# Patient Record
Sex: Male | Born: 1950
Health system: Southern US, Community
[De-identification: ages and names within clinical notes are randomized; demographics above are authoritative.]

## PROBLEM LIST (undated history)

## (undated) DIAGNOSIS — E7800 Pure hypercholesterolemia, unspecified: Secondary | ICD-10-CM

## (undated) DIAGNOSIS — I2510 Atherosclerotic heart disease of native coronary artery without angina pectoris: Secondary | ICD-10-CM

## (undated) DIAGNOSIS — C801 Malignant (primary) neoplasm, unspecified: Secondary | ICD-10-CM

## (undated) DIAGNOSIS — J432 Centrilobular emphysema: Secondary | ICD-10-CM

## (undated) DIAGNOSIS — C4490 Unspecified malignant neoplasm of skin, unspecified: Secondary | ICD-10-CM

## (undated) DIAGNOSIS — J449 Chronic obstructive pulmonary disease, unspecified: Secondary | ICD-10-CM

## (undated) DIAGNOSIS — D126 Benign neoplasm of colon, unspecified: Secondary | ICD-10-CM

## (undated) DIAGNOSIS — I4891 Unspecified atrial fibrillation: Secondary | ICD-10-CM

## (undated) DIAGNOSIS — F17200 Nicotine dependence, unspecified, uncomplicated: Secondary | ICD-10-CM

## (undated) DIAGNOSIS — Z86718 Personal history of other venous thrombosis and embolism: Secondary | ICD-10-CM

## (undated) HISTORY — DX: Nicotine dependence, unspecified, uncomplicated: F17.200

## (undated) HISTORY — DX: Benign neoplasm of colon, unspecified: D12.6

## (undated) HISTORY — DX: Personal history of other venous thrombosis and embolism: Z86.718

## (undated) HISTORY — PX: APPENDECTOMY: SHX54

## (undated) HISTORY — PX: TONSILLECTOMY: SUR1361

## (undated) HISTORY — DX: Centrilobular emphysema: J43.2

---

## 2005-08-07 ENCOUNTER — Emergency Department (HOSPITAL_COMMUNITY): Admission: EM | Admit: 2005-08-07 | Discharge: 2005-08-08 | Payer: Self-pay | Admitting: *Deleted

## 2005-08-22 ENCOUNTER — Encounter: Admission: RE | Admit: 2005-08-22 | Discharge: 2005-08-22 | Payer: Self-pay | Admitting: Internal Medicine

## 2006-08-26 ENCOUNTER — Encounter: Admission: RE | Admit: 2006-08-26 | Discharge: 2006-08-26 | Payer: Self-pay | Admitting: Internal Medicine

## 2007-05-03 IMAGING — CR DG CERVICAL SPINE COMPLETE 4+V
7 series · 7 of 7 positions shown · non-contrast
Comparison: None.

CLINICAL DATA: Fall from tractor.  Back pain.

[w c-spine lat]
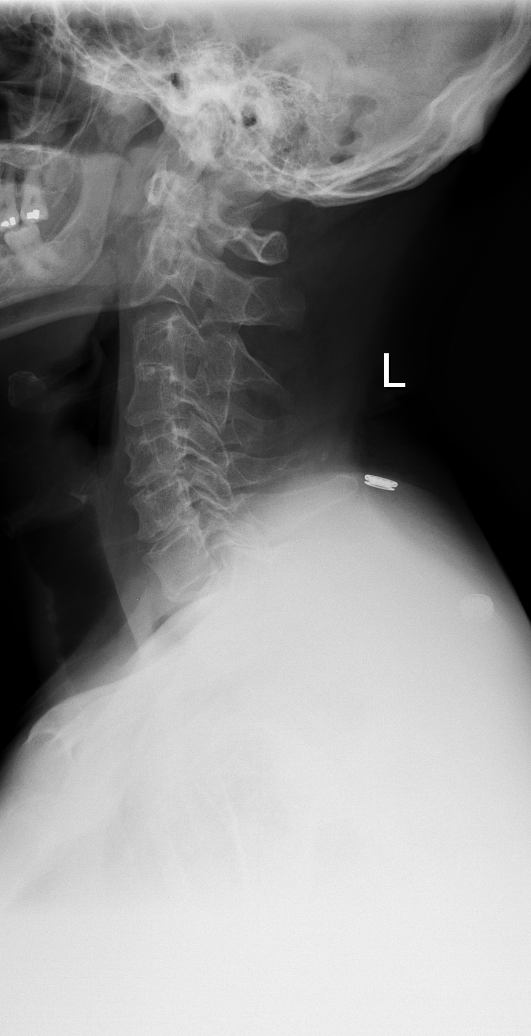

[w swimmers view]
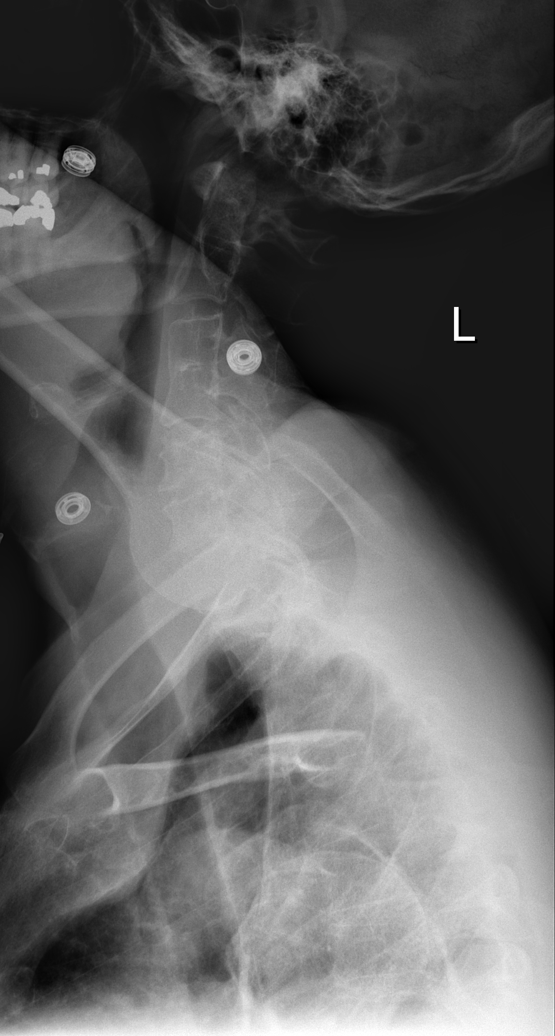

[w c-spine oblique (1 of 2)]
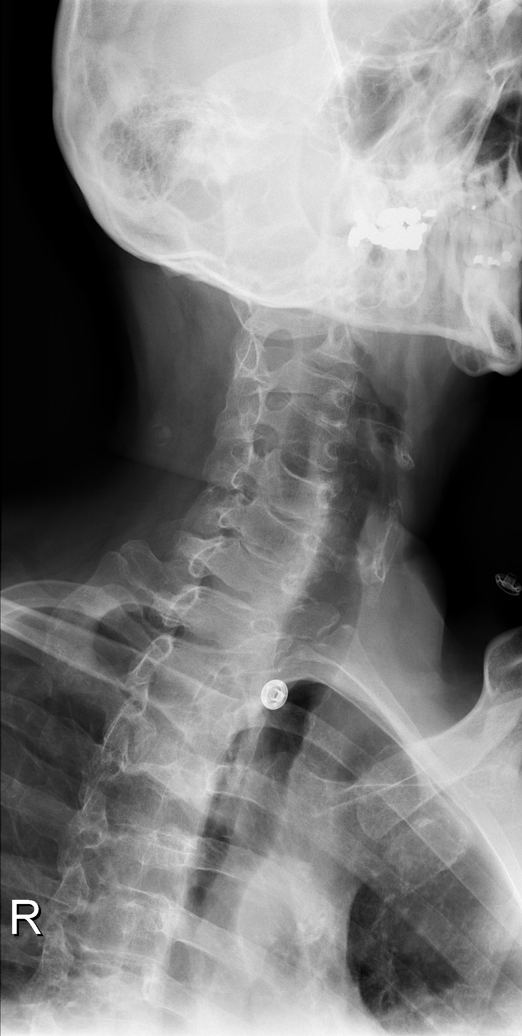

[w c-spine oblique (2 of 2)]
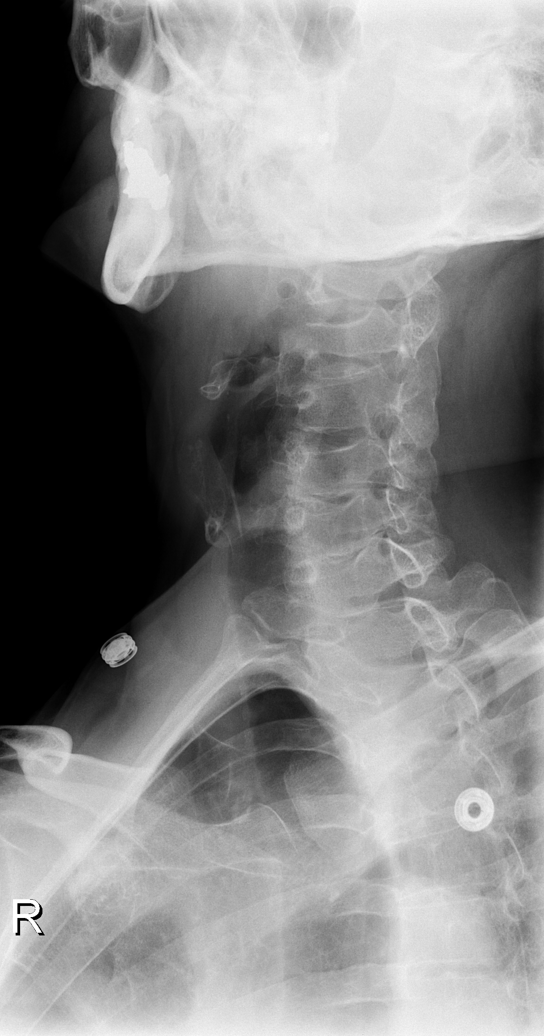

[w c-spine a.p.]
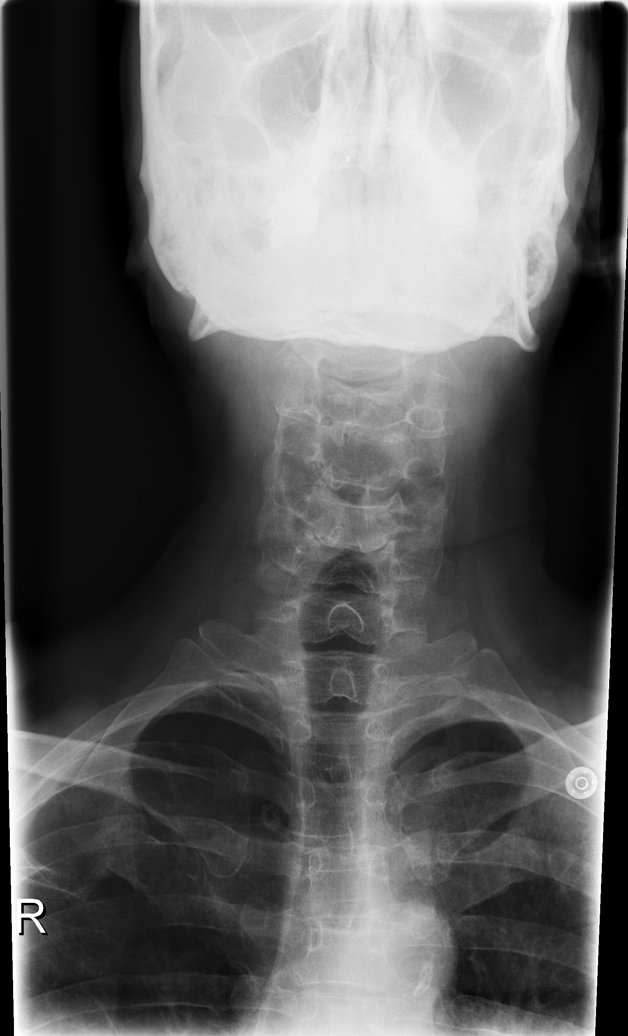

[w c-spine odontoid *]
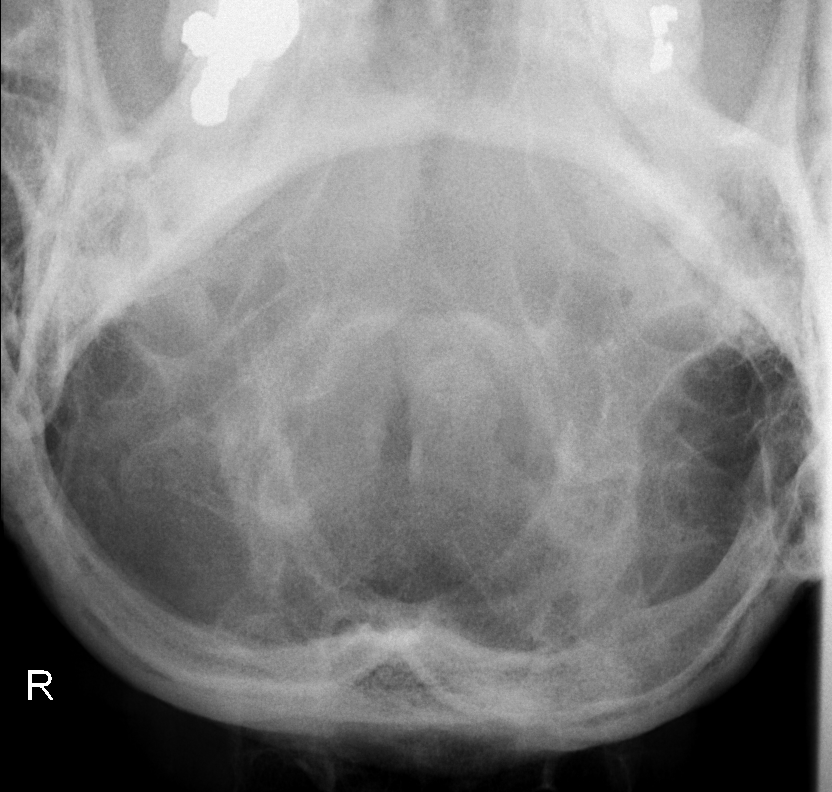

[w c-spine odontoid]
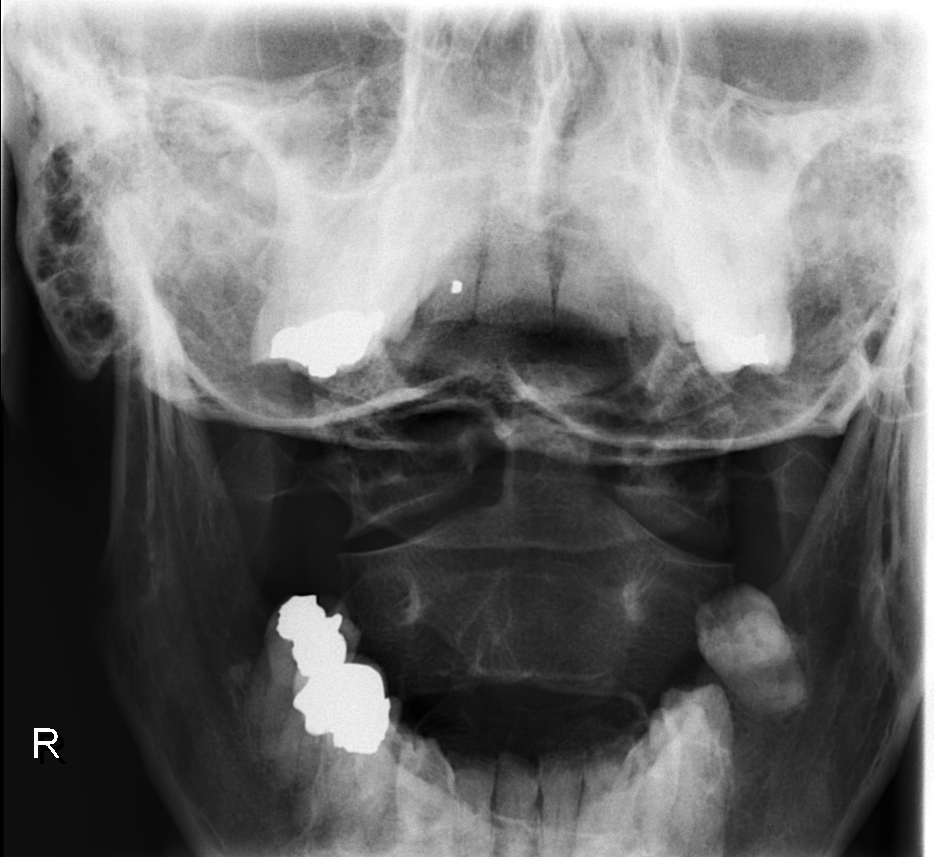

[7 of 7 positions shown; findings below may reference images not displayed]

CERVICAL SPINE - 5 VIEW:
 There is no evidence for acute fracture or subluxation from the skull base through the C7-T1 interspace.  Patient is congenitally fused at C3-4.  There is diffuse degenerative disk disease with end-plate spurring in the lower cervical spine.  Disk height at C2-3 is preserved.  Spinal laminar line is intact.  Oblique views demonstrate bony foraminal encroachment on the right at C5-6 and C6-7 and on the left at C6-7 secondary mainly to uncinate spurring.  Facets are diffusely degenerated.  There is no prevertebral soft tissue swelling.
IMPRESSION: 1.  No evidence for acute fracture or subluxation from the skull base to the C7-T1 interspace.  
 2.  Congenital fusion at C3-4.  
 3.  Diffuse degenerative disk disease and degenerative facet disease.  If there is a high clinical index of suspicion for cervical spine fracture, CT would be a more sensitive means to investigate.

## 2012-03-18 HISTORY — PX: COLONOSCOPY: SHX174

## 2012-05-28 ENCOUNTER — Other Ambulatory Visit: Payer: Self-pay | Admitting: Urology

## 2012-06-19 ENCOUNTER — Other Ambulatory Visit: Payer: Self-pay | Admitting: Urology

## 2012-06-23 ENCOUNTER — Encounter (HOSPITAL_COMMUNITY): Payer: Self-pay | Admitting: Pharmacy Technician

## 2012-06-30 ENCOUNTER — Encounter (HOSPITAL_COMMUNITY): Payer: Self-pay

## 2012-06-30 ENCOUNTER — Ambulatory Visit (HOSPITAL_COMMUNITY)
Admission: RE | Admit: 2012-06-30 | Discharge: 2012-06-30 | Disposition: A | Discharge status: Home / Self Care | Payer: BC Managed Care – PPO | Source: Ambulatory Visit | Attending: Urology | Admitting: Urology

## 2012-06-30 ENCOUNTER — Encounter (HOSPITAL_COMMUNITY)
Admission: RE | Admit: 2012-06-30 | Discharge: 2012-06-30 | Disposition: A | Discharge status: Home / Self Care | Payer: BC Managed Care – PPO | Source: Ambulatory Visit | Attending: Urology | Admitting: Urology

## 2012-06-30 ENCOUNTER — Other Ambulatory Visit: Payer: Self-pay | Admitting: Urology

## 2012-06-30 DIAGNOSIS — R091 Pleurisy: Secondary | ICD-10-CM | POA: Insufficient documentation

## 2012-06-30 DIAGNOSIS — Z01810 Encounter for preprocedural cardiovascular examination: Secondary | ICD-10-CM | POA: Insufficient documentation

## 2012-06-30 DIAGNOSIS — C61 Malignant neoplasm of prostate: Secondary | ICD-10-CM | POA: Insufficient documentation

## 2012-06-30 DIAGNOSIS — Z01812 Encounter for preprocedural laboratory examination: Secondary | ICD-10-CM | POA: Insufficient documentation

## 2012-06-30 DIAGNOSIS — J439 Emphysema, unspecified: Secondary | ICD-10-CM | POA: Insufficient documentation

## 2012-06-30 HISTORY — DX: Pure hypercholesterolemia, unspecified: E78.00

## 2012-06-30 HISTORY — DX: Malignant (primary) neoplasm, unspecified: C80.1

## 2012-06-30 LAB — CBC
MCH: 32.8 pg (ref 26.0–34.0)
MCHC: 35.6 g/dL (ref 30.0–36.0)
RBC: 4.72 MIL/uL (ref 4.22–5.81)
WBC: 6.6 10*3/uL (ref 4.0–10.5)

## 2012-06-30 LAB — BASIC METABOLIC PANEL
BUN: 11 mg/dL (ref 6–23)
CO2: 32 mEq/L (ref 19–32)
Creatinine, Ser: 0.88 mg/dL (ref 0.50–1.35)
GFR calc Af Amer: >90 mL/min (ref >90)
Potassium: 3.9 mEq/L (ref 3.5–5.1)
Sodium: 141 mEq/L (ref 135–145)

## 2012-06-30 LAB — ABO/RH: ABO/RH(D): AB POS

## 2012-06-30 LAB — SURGICAL PCR SCREEN: Staphylococcus aureus: NEGATIVE

## 2012-06-30 NOTE — Progress Notes (Signed)
Left voice mail with Monica Martinez, Dr Peggyann Shoals schedular requesting order for consent to be placed in EPIC before patients PST appt today

## 2012-06-30 NOTE — Patient Instructions (Addendum)
20 Dante Roudebush  06/30/2012   Your procedure is scheduled on:  07/09/12 THURSDAY  Report to [facility] at    0515   AM.  Call this number if you have problems the morning of surgery: [phone]       Remember:   Do not eat food After Midnight.TUESDAY NIGHT.  START DRINKING CLEAR  LIQUIDS Wednesday MORNING AS DIRECTED BY OFFICE FOR BOWEL PREP.  INCREASE AMOUNT FLUIDS TAKEN IN ON Wednesday.  DRINK UNTIL MIDNIGHT OR BEDTIME Wednesday NIGHT,  THEN NOTHING BY MOUTH AFTER MIDNIGHT Wednesday NIGHT   Take these medicines the morning of surgery with A SIP OF WATER:  NONE   .  Contacts, dentures or partial plates can not be worn to surgery  Leave suitcase in the car. After surgery it may be brought to your room.  For patients admitted to the hospital, checkout time is 11:00 AM day of  discharge.             SPECIAL INSTRUCTIONS- SEE  PREPARING FOR SURGERY INSTRUCTION SHEET-     DO NOT WEAR JEWELRY, LOTIONS, POWDERS, OR PERFUMES.  WOMEN-- DO NOT SHAVE LEGS OR UNDERARMS FOR 12 HOURS BEFORE SHOWERS. MEN MAY SHAVE FACE.  Patients discharged the day of surgery will not be allowed to drive home. IF going home the day of surgery, you must have a driver and someone to stay with you for the first 24 hours  Name and phone number of your driver:  admission                                                                      Please read over the following fact sheets that you were given: MRSA Information, Incentive Spirometry Sheet, Blood Transfusion Sheet  Information                                                                                   Sabriyah Wilcher  PST [phone]  [phone]                 FAILURE TO FOLLOW THESE INSTRUCTIONS MAY RESULT IN  CANCELLATION   OF YOUR SURGERY                                                  Patient Signature _____________________________

## 2012-07-01 NOTE — Progress Notes (Signed)
Faxed chest x ray report to Dr Laverle Patter with confirmation. Also requested for order for consent to be placed in EPIC to be signed day of surgery

## 2012-07-01 NOTE — Progress Notes (Signed)
Left message with Selita at [facility] of abnormal chest x ray and that I had faxed over report to Dr Laverle Patter this AM. Also, reminded her there isnt a order for surgical consent to be signed

## 2012-07-08 NOTE — H&P (Addendum)
  Chief Complaint  Prostate Cancer     History of Present Illness  [name] is a 62 year old who was noted to have an elevated PSA of 11.5 and a left sided prostate nodule prompting a prostate biopsy by Dr. Retta Diones on 05/06/12.  This demonstrated Gleason 3+4=7 adenocarcinoma with 4 out of 12 biopsy cores positive for malignancy. On TRUS, he did have left sided calcification which may explain his DRE findings. His father and brother both have a history of prostate cancer. He is excellent overall health with no major medical comorbidities aside from hypertension.  He is very well informed about his treatment options through his prior discussions with Dr. Retta Diones and is interested in proceeding with surgical therapy.  TNM stage: cT2a Nx Mx PSA: 11.5 Gleason score: 3+4=7 Biopsy (05/06/12): 4/12 cores positive    Left: L lateral mid (10%, 3+3=6)    Right: R apex (40%, 3+3=6), R lateral apex (40%, 3+4=7, PNI), R lateral mid (20%, 3+3=6) Prostate volume:37.2 cc  Nomogram OC disease: 55% EPE: 43% SVI: 10% LNI: 3.6% PFS (surgery) 90%, 85%   Past Medical History  None   Surgical History Problems  1. History of  Tonsillectomy  Current Meds 1. Aspirin 81 MG Oral Tablet; Therapy: (Recorded:06Jan2014) to 2. Multi-Vitamin Oral Tablet; Therapy: (Recorded:06Jan2014) to  Allergies Medication  1. No Known Drug Allergies  Family History Problems  1. Family history of  Death In The Family Father Deceased at age 3; cancer 2. Family history of  Death In The Family Mother Deceased at age 5; stroke 3. Family history of  Heart Disease V17.49 4. Paternal history of  Prostate Cancer V16.42 5. Fraternal history of  Prostate Cancer V16.42  Social History Problems    Alcohol Use occ/social   Marital History - Currently Married   Occupation: Retired   Tobacco Use 305.1 Has smoked 1 ppd for 45 yrs  Review of Systems Constitutional, skin, eye, otolaryngeal, hematologic/lymphatic,  cardiovascular, pulmonary, endocrine, musculoskeletal, gastrointestinal, neurological and psychiatric system(s) were reviewed and pertinent findings if present are noted.    Vitals  BMI Calculated: 26.41 BSA Calculated: 2.11 Height: 6 ft  Weight: 195 lb    Physical Exam Constitutional: Well nourished and well developed . No acute distress.  ENT:. The ears and nose are normal in appearance.  Neck: The appearance of the neck is normal and no neck mass is present.  Pulmonary: No respiratory distress, normal respiratory rhythm and effort and clear bilateral breath sounds.  Cardiovascular: Heart rate and rhythm are normal . No peripheral edema.  Abdomen: The abdomen is soft and nontender. No masses are palpated. No CVA tenderness. No hernias are palpable. No hepatosplenomegaly noted.   Lymphatics: The femoral and inguinal nodes are not enlarged or tender.  Skin: Normal skin turgor, no visible rash and no visible skin lesions.  Neuro/Psych:. Mood and affect are appropriate.      Assessment Assessed  1. Adenocarcinoma Of The Prostate Gland 185  Discussion/Summary  1. Prostate cancer: He will proceed with a robotic assisted laparoscopic radical prostatectomy and BPLND.

## 2012-07-08 NOTE — Anesthesia Preprocedure Evaluation (Addendum)
Anesthesia Evaluation  Patient identified by MRN, date of birth, ID band Patient awake    Reviewed: Allergy & Precautions, H&P , NPO status , Patient's Chart, lab work & pertinent test results  Airway Mallampati: II TM Distance: >3 FB Neck ROM: full    Dental  (+) Dental Advisory Given, Caps and Chipped Left upper front has bonding and is chipped a little:   Pulmonary neg pulmonary ROS,  breath sounds clear to auscultation  Pulmonary exam normal       Cardiovascular Exercise Tolerance: Good negative cardio ROS  Rhythm:regular Rate:Normal     Neuro/Psych negative neurological ROS  negative psych ROS   GI/Hepatic negative GI ROS, Neg liver ROS,   Endo/Other  negative endocrine ROS  Renal/GU negative Renal ROS  negative genitourinary   Musculoskeletal   Abdominal   Peds  Hematology negative hematology ROS (+)   Anesthesia Other Findings   Reproductive/Obstetrics negative OB ROS                          Anesthesia Physical Anesthesia Plan  ASA: II  Anesthesia Plan: General   Post-op Pain Management:    Induction: Intravenous  Airway Management Planned: Oral ETT  Additional Equipment:   Intra-op Plan:   Post-operative Plan: Extubation in OR  Informed Consent: I have reviewed the patients History and Physical, chart, labs and discussed the procedure including the risks, benefits and alternatives for the proposed anesthesia with the patient or authorized representative who has indicated his/her understanding and acceptance.   Dental Advisory Given  Plan Discussed with: CRNA and Surgeon  Anesthesia Plan Comments:         Anesthesia Quick Evaluation

## 2012-07-09 ENCOUNTER — Inpatient Hospital Stay (HOSPITAL_COMMUNITY): Payer: BC Managed Care – PPO | Admitting: Anesthesiology

## 2012-07-09 ENCOUNTER — Encounter (HOSPITAL_COMMUNITY): Payer: Self-pay | Admitting: Anesthesiology

## 2012-07-09 ENCOUNTER — Encounter (HOSPITAL_COMMUNITY): Payer: Self-pay | Admitting: *Deleted

## 2012-07-09 ENCOUNTER — Inpatient Hospital Stay (HOSPITAL_COMMUNITY)
Admission: RE | Admit: 2012-07-09 | Discharge: 2012-07-10 | DRG: 335 | Disposition: A | Discharge status: Home / Self Care | Payer: BC Managed Care – PPO | Source: Ambulatory Visit | Attending: Urology | Admitting: Urology

## 2012-07-09 ENCOUNTER — Encounter (HOSPITAL_COMMUNITY)
Admission: RE | Disposition: A | Discharge status: Home / Self Care | Payer: Self-pay | Source: Ambulatory Visit | Attending: Urology

## 2012-07-09 DIAGNOSIS — I10 Essential (primary) hypertension: Secondary | ICD-10-CM | POA: Diagnosis present

## 2012-07-09 DIAGNOSIS — C61 Malignant neoplasm of prostate: Principal | ICD-10-CM | POA: Diagnosis present

## 2012-07-09 HISTORY — PX: ROBOT ASSISTED LAPAROSCOPIC RADICAL PROSTATECTOMY: SHX5141

## 2012-07-09 HISTORY — PX: LYMPHADENECTOMY: SHX5960

## 2012-07-09 LAB — TYPE AND SCREEN
ABO/RH(D): AB POS
Antibody Screen: NEGATIVE

## 2012-07-09 LAB — HEMOGLOBIN AND HEMATOCRIT, BLOOD
HCT: 41 % (ref 39.0–52.0)
Hemoglobin: 14 g/dL (ref 13.0–17.0)

## 2012-07-09 SURGERY — ROBOTIC ASSISTED LAPAROSCOPIC RADICAL PROSTATECTOMY LEVEL 2
Anesthesia: General | Site: Pelvis | Wound class: Clean Contaminated

## 2012-07-09 MED ORDER — HYDROMORPHONE HCL PF 1 MG/ML IJ SOLN
INTRAMUSCULAR | Status: DC | PRN
  Administered 2012-07-09 (×3): 0.5 mg via INTRAVENOUS

## 2012-07-09 MED ORDER — MIDAZOLAM HCL 5 MG/5ML IJ SOLN
INTRAMUSCULAR | Status: DC | PRN
  Administered 2012-07-09: 2 mg via INTRAVENOUS

## 2012-07-09 MED ORDER — LIDOCAINE HCL (CARDIAC) 20 MG/ML IV SOLN
INTRAVENOUS | Status: DC | PRN
  Administered 2012-07-09: 50 mg via INTRAVENOUS

## 2012-07-09 MED ORDER — NEOSTIGMINE METHYLSULFATE 1 MG/ML IJ SOLN
INTRAMUSCULAR | Status: DC | PRN
  Administered 2012-07-09: 5 mg via INTRAVENOUS

## 2012-07-09 MED ORDER — HYDROCODONE-ACETAMINOPHEN 5-325 MG PO TABS: 1.0000 | ORAL_TABLET | Freq: Four times a day (QID) | ORAL | Status: DC | PRN

## 2012-07-09 MED ORDER — ROCURONIUM BROMIDE 100 MG/10ML IV SOLN
INTRAVENOUS | Status: DC | PRN
  Administered 2012-07-09: 50 mg via INTRAVENOUS

## 2012-07-09 MED ORDER — SODIUM CHLORIDE 0.9 % IV BOLUS (SEPSIS)
1000.0000 mL | Freq: Once | INTRAVENOUS | Status: AC
  Administered 2012-07-09: 1000 mL via INTRAVENOUS

## 2012-07-09 MED ORDER — CIPROFLOXACIN HCL 500 MG PO TABS: 500.0000 mg | ORAL_TABLET | Freq: Two times a day (BID) | ORAL | Status: DC

## 2012-07-09 MED ORDER — CISATRACURIUM BESYLATE (PF) 10 MG/5ML IV SOLN
INTRAVENOUS | Status: DC | PRN
  Administered 2012-07-09: 2 mg via INTRAVENOUS

## 2012-07-09 MED ORDER — ACETAMINOPHEN 325 MG PO TABS: 650.0000 mg | ORAL_TABLET | ORAL | Status: DC | PRN

## 2012-07-09 MED ORDER — SODIUM CHLORIDE 0.9 % IR SOLN
Status: DC | PRN
  Administered 2012-07-09: 300 mL via INTRAVESICAL

## 2012-07-09 MED ORDER — LACTATED RINGERS IV SOLN
INTRAVENOUS | Status: DC | PRN
  Administered 2012-07-09 (×2): via INTRAVENOUS

## 2012-07-09 MED ORDER — EPHEDRINE SULFATE 50 MG/ML IJ SOLN
INTRAMUSCULAR | Status: DC | PRN
  Administered 2012-07-09: 10 mg via INTRAVENOUS

## 2012-07-09 MED ORDER — BUPIVACAINE-EPINEPHRINE 0.25% -1:200000 IJ SOLN
INTRAMUSCULAR | Status: DC | PRN
  Administered 2012-07-09: 30 mL

## 2012-07-09 MED ORDER — HEPARIN SODIUM (PORCINE) 1000 UNIT/ML IJ SOLN
INTRAMUSCULAR | Status: DC | PRN
  Administered 2012-07-09: 10:00:00

## 2012-07-09 MED ORDER — PNEUMOCOCCAL VAC POLYVALENT 25 MCG/0.5ML IJ INJ
0.5000 mL | INJECTION | INTRAMUSCULAR | Status: AC
  Administered 2012-07-10: 0.5 mL via INTRAMUSCULAR
  Filled 2012-07-09 (×2): qty 0.5

## 2012-07-09 MED ORDER — DIPHENHYDRAMINE HCL 50 MG/ML IJ SOLN
12.5000 mg | Freq: Four times a day (QID) | INTRAMUSCULAR | Status: DC | PRN
  Administered 2012-07-09: 25 mg via INTRAVENOUS
  Filled 2012-07-09: qty 1

## 2012-07-09 MED ORDER — ACETAMINOPHEN 10 MG/ML IV SOLN
INTRAVENOUS | Status: DC | PRN
  Administered 2012-07-09: 1000 mg via INTRAVENOUS

## 2012-07-09 MED ORDER — MORPHINE SULFATE 2 MG/ML IJ SOLN
2.0000 mg | INTRAMUSCULAR | Status: DC | PRN
  Administered 2012-07-09: 2 mg via INTRAVENOUS

## 2012-07-09 MED ORDER — GLYCOPYRROLATE 0.2 MG/ML IJ SOLN
INTRAMUSCULAR | Status: DC | PRN
  Administered 2012-07-09: .8 mg via INTRAVENOUS
  Administered 2012-07-09: 0.2 mg via INTRAVENOUS

## 2012-07-09 MED ORDER — PROPOFOL 10 MG/ML IV BOLUS
INTRAVENOUS | Status: DC | PRN
  Administered 2012-07-09: 170 mg via INTRAVENOUS

## 2012-07-09 MED ORDER — LACTATED RINGERS IV SOLN: INTRAVENOUS | Status: DC

## 2012-07-09 MED ORDER — CEFAZOLIN SODIUM-DEXTROSE 2-3 GM-% IV SOLR
2.0000 g | INTRAVENOUS | Status: AC
  Administered 2012-07-09: 2 g via INTRAVENOUS

## 2012-07-09 MED ORDER — DOCUSATE SODIUM 100 MG PO CAPS
100.0000 mg | ORAL_CAPSULE | Freq: Two times a day (BID) | ORAL | Status: DC
  Administered 2012-07-09 – 2012-07-10 (×3): 100 mg via ORAL
  Filled 2012-07-09 (×5): qty 1

## 2012-07-09 MED ORDER — SUFENTANIL CITRATE 50 MCG/ML IV SOLN
INTRAVENOUS | Status: DC | PRN
  Administered 2012-07-09: 5 ug via INTRAVENOUS
  Administered 2012-07-09: 7.5 ug via INTRAVENOUS
  Administered 2012-07-09: 5 ug via INTRAVENOUS
  Administered 2012-07-09: 10 ug via INTRAVENOUS
  Administered 2012-07-09: 7.5 ug via INTRAVENOUS

## 2012-07-09 MED ORDER — DIPHENHYDRAMINE HCL 12.5 MG/5ML PO ELIX: 12.5000 mg | ORAL_SOLUTION | Freq: Four times a day (QID) | ORAL | Status: DC | PRN

## 2012-07-09 MED ORDER — KCL IN DEXTROSE-NACL 20-5-0.45 MEQ/L-%-% IV SOLN
INTRAVENOUS | Status: DC
  Administered 2012-07-09 (×4): via INTRAVENOUS
  Filled 2012-07-09 (×5): qty 1000

## 2012-07-09 MED ORDER — KETOROLAC TROMETHAMINE 15 MG/ML IJ SOLN
15.0000 mg | Freq: Four times a day (QID) | INTRAMUSCULAR | Status: DC
  Administered 2012-07-09 – 2012-07-10 (×4): 15 mg via INTRAVENOUS
  Filled 2012-07-09 (×5): qty 1

## 2012-07-09 MED ORDER — ONDANSETRON HCL 4 MG/2ML IJ SOLN
INTRAMUSCULAR | Status: DC | PRN
  Administered 2012-07-09: 4 mg via INTRAVENOUS

## 2012-07-09 MED ORDER — HYDROMORPHONE HCL PF 1 MG/ML IJ SOLN: 0.2500 mg | INTRAMUSCULAR | Status: DC | PRN

## 2012-07-09 MED ORDER — CEFAZOLIN SODIUM 1-5 GM-% IV SOLN
1.0000 g | Freq: Three times a day (TID) | INTRAVENOUS | Status: AC
  Administered 2012-07-09 (×2): 1 g via INTRAVENOUS
  Filled 2012-07-09 (×2): qty 50

## 2012-07-09 MED ORDER — STERILE WATER FOR IRRIGATION IR SOLN
Status: DC | PRN
  Administered 2012-07-09: 1500 mL

## 2012-07-09 MED ORDER — INDIGOTINDISULFONATE SODIUM 8 MG/ML IJ SOLN
INTRAMUSCULAR | Status: DC | PRN
  Administered 2012-07-09 (×2): 5 mL via INTRAVENOUS

## 2012-07-09 SURGICAL SUPPLY — 42 items
CANISTER SUCTION 2500CC (MISCELLANEOUS) ×3 IMPLANT
CATH FOLEY 2WAY SLVR 18FR 30CC (CATHETERS) ×3 IMPLANT
CATH ROBINSON RED A/P 16FR (CATHETERS) ×3 IMPLANT
CATH ROBINSON RED A/P 8FR (CATHETERS) ×3 IMPLANT
CATH TIEMANN FOLEY 18FR 5CC (CATHETERS) ×3 IMPLANT
CHLORAPREP W/TINT 26ML (MISCELLANEOUS) ×3 IMPLANT
CLIP LIGATING HEM O LOK PURPLE (MISCELLANEOUS) ×6 IMPLANT
CLOTH BEACON ORANGE TIMEOUT ST (SAFETY) ×3 IMPLANT
CORD HIGH FREQUENCY UNIPOLAR (ELECTROSURGICAL) ×3 IMPLANT
COVER SURGICAL LIGHT HANDLE (MISCELLANEOUS) ×3 IMPLANT
COVER TIP SHEARS 8 DVNC (MISCELLANEOUS) ×2 IMPLANT
COVER TIP SHEARS 8MM DA VINCI (MISCELLANEOUS) ×1
CUTTER ECHEON FLEX ENDO 45 340 (ENDOMECHANICALS) ×3 IMPLANT
DECANTER SPIKE VIAL GLASS SM (MISCELLANEOUS) ×3 IMPLANT
DRAPE SURG IRRIG POUCH 19X23 (DRAPES) ×3 IMPLANT
DRSG TEGADERM 2-3/8X2-3/4 SM (GAUZE/BANDAGES/DRESSINGS) ×12 IMPLANT
DRSG TEGADERM 4X4.75 (GAUZE/BANDAGES/DRESSINGS) ×6 IMPLANT
DRSG TEGADERM 6X8 (GAUZE/BANDAGES/DRESSINGS) ×6 IMPLANT
ELECT REM PT RETURN 9FT ADLT (ELECTROSURGICAL) ×3
ELECTRODE REM PT RTRN 9FT ADLT (ELECTROSURGICAL) ×2 IMPLANT
GAUZE SPONGE 2X2 8PLY STRL LF (GAUZE/BANDAGES/DRESSINGS) ×2 IMPLANT
GLOVE BIO SURGEON STRL SZ 6.5 (GLOVE) ×3 IMPLANT
GLOVE BIOGEL M STRL SZ7.5 (GLOVE) ×6 IMPLANT
GOWN STRL NON-REIN LRG LVL3 (GOWN DISPOSABLE) ×9 IMPLANT
GOWN STRL REIN XL XLG (GOWN DISPOSABLE) ×6 IMPLANT
HOLDER FOLEY CATH W/STRAP (MISCELLANEOUS) ×3 IMPLANT
IV LACTATED RINGERS 1000ML (IV SOLUTION) ×3 IMPLANT
KIT ACCESSORY DA VINCI DISP (KITS) ×1
KIT ACCESSORY DVNC DISP (KITS) ×2 IMPLANT
NDL SAFETY ECLIPSE 18X1.5 (NEEDLE) ×2 IMPLANT
NEEDLE HYPO 18GX1.5 SHARP (NEEDLE) ×3
PACK ROBOT UROLOGY CUSTOM (CUSTOM PROCEDURE TRAY) ×3 IMPLANT
RELOAD GREEN ECHELON 45 (STAPLE) ×3 IMPLANT
SET TUBE IRRIG SUCTION NO TIP (IRRIGATION / IRRIGATOR) ×3 IMPLANT
SOLUTION ELECTROLUBE (MISCELLANEOUS) ×3 IMPLANT
SPONGE GAUZE 2X2 STER 10/PKG (GAUZE/BANDAGES/DRESSINGS) ×1
SUT ETHILON 3 0 PS 1 (SUTURE) ×3 IMPLANT
SUT VICRYL 0 UR6 27IN ABS (SUTURE) ×6 IMPLANT
SYR 27GX1/2 1ML LL SAFETY (SYRINGE) ×3 IMPLANT
TOWEL OR 17X26 10 PK STRL BLUE (TOWEL DISPOSABLE) ×3 IMPLANT
TOWEL OR NON WOVEN STRL DISP B (DISPOSABLE) ×3 IMPLANT
WATER STERILE IRR 1500ML POUR (IV SOLUTION) ×6 IMPLANT

## 2012-07-09 NOTE — Progress Notes (Signed)
Day of Surgery Subjective: Patient reports pain control good.  Denies N/V.  Objective: Vital signs in last 24 hours: Temp:  [97.4 F (36.3 C)-97.9 F (36.6 C)] 97.4 F (36.3 C) (04/24 1122) Pulse Rate:  [71-77] 77 (04/24 1100) Resp:  [10-18] 14 (04/24 1100) BP: (120-133)/(58-70) 127/64 mmHg (04/24 1122) SpO2:  [95 %-100 %] 95 % (04/24 1100)  Intake/Output from previous day:   Intake/Output this shift: Total I/O In: 3000 [I.V.:2000; IV Piggyback:1000] Out: 240 [Urine:100; Drains:40; Blood:100]  Physical Exam:  General:alert, cooperative and no distress Breathing unlabored  GI: soft Incisions: dressings C/D/I Urine: blue Extremities: SCDs in place  Lab Results:  Recent Labs  07/09/12 1009  HGB 14.0  HCT 41.0   BMET No results found for this basename: NA, K, CL, CO2, GLUCOSE, BUN, CREATININE, CALCIUM,  in the last 72 hours No results found for this basename: LABPT, INR,  in the last 72 hours No results found for this basename: LABURIN,  in the last 72 hours Results for orders placed during the hospital encounter of 06/30/12  SURGICAL PCR SCREEN     Status: None   Collection Time    06/30/12  1:57 PM      Result Value Range Status   MRSA, PCR NEGATIVE  NEGATIVE Final   Staphylococcus aureus NEGATIVE  NEGATIVE Final   Comment:            The Xpert SA Assay (FDA     approved for NASAL specimens     in patients over 110 years of age),     is one component of     a comprehensive surveillance     program.  Test performance has     been validated by [employer] for patients greater     than or equal to 22 year old.     It is not intended     to diagnose infection nor to     guide or monitor treatment.    Studies/Results: No results found.  Assessment/Plan: Day of Surgery, Procedure(s) (LRB): ROBOTIC ASSISTED LAPAROSCOPIC RADICAL PROSTATECTOMY LEVEL 2 (N/A) LYMPHADENECTOMY (Bilateral)  Doing well. Ambulate, Incentive spirometry DVT prophylaxis Pain  control   LOS: 0 days   YARBROUGH,Tylar Merendino G. 07/09/2012, 2:22 PM

## 2012-07-09 NOTE — Transfer of Care (Signed)
Immediate Anesthesia Transfer of Care Note  Patient: Adam Gilmore  Procedure(s) Performed: Procedure(s): ROBOTIC ASSISTED LAPAROSCOPIC RADICAL PROSTATECTOMY LEVEL 2 (N/A) LYMPHADENECTOMY (Bilateral)  Patient Location: PACU  Anesthesia Type:General  Level of Consciousness: awake, alert , oriented and patient cooperative  Airway & Oxygen Therapy: Patient Spontanous Breathing and Patient connected to face mask oxygen  Post-op Assessment: Report given to PACU RN, Post -op Vital signs reviewed and stable and Patient moving all extremities  Post vital signs: Reviewed and stable  Complications: No apparent anesthesia complications

## 2012-07-09 NOTE — OR Nursing (Signed)
Late entry: foley catheter documentation. 07/09/2012 @1030 

## 2012-07-09 NOTE — Op Note (Signed)
Preoperative diagnosis: Clinically localized adenocarcinoma of the prostate (clinical stage T2a Nx Mx)  Postoperative diagnosis: Clinically localized adenocarcinoma of the prostate (clinical stage T2a Nx Mx)  Procedure:  1. Robotic assisted laparoscopic radical prostatectomy (bilateral nerve sparing) 2. Bilateral robotic assisted laparoscopic pelvic lymphadenectomy  Surgeon: Moody Bruins. M.D.  Assistant: Pecola Leisure, PA-C  Anesthesia: General  Complications: None  EBL: 100 mL  IVF:  2000 mL crystalloid  Specimens: 1. Prostate and seminal vesicles 2. Right pelvic lymph nodes 3. Left pelvic lymph nodes  Disposition of specimens: Pathology  Drains: 1. 20 Fr coude catheter 2. # 19 Blake pelvic drain  Indication: Adam Gilmore is a 62 y.o. year old patient with clinically localized prostate cancer.  After a thorough review of the management options for treatment of prostate cancer, he elected to proceed with surgical therapy and the above procedure(s).  We have discussed the potential benefits and risks of the procedure, side effects of the proposed treatment, the likelihood of the patient achieving the goals of the procedure, and any potential problems that might occur during the procedure or recuperation. Informed consent has been obtained.  Description of procedure:  The patient was taken to the operating room and a general anesthetic was administered. He was given preoperative antibiotics, placed in the dorsal lithotomy position, and prepped and draped in the usual sterile fashion. Next a preoperative timeout was performed. A urethral catheter was placed into the bladder and a site was selected near the umbilicus for placement of the camera port. This was placed using a standard open Hassan technique which allowed entry into the peritoneal cavity under direct vision and without difficulty. A 12 mm port was placed and a pneumoperitoneum established. The camera was  then used to inspect the abdomen and there was no evidence of any intra-abdominal injuries or other abnormalities. The remaining abdominal ports were then placed. 8 mm robotic ports were placed in the right lower quadrant, left lower quadrant, and far left lateral abdominal wall. A 5 mm port was placed in the right upper quadrant and a 12 mm port was placed in the right lateral abdominal wall for laparoscopic assistance. All ports were placed under direct vision without difficulty. The surgical cart was then docked.   Utilizing the cautery scissors, the bladder was reflected posteriorly allowing entry into the space of Retzius and identification of the endopelvic fascia and prostate. The periprostatic fat was then removed from the prostate allowing full exposure of the endopelvic fascia. The endopelvic fascia was then incised from the apex back to the base of the prostate bilaterally and the underlying levator muscle fibers were swept laterally off the prostate thereby isolating the dorsal venous complex. The dorsal vein was then stapled and divided with a 45 mm Flex Echelon stapler. Attention then turned to the bladder neck which was divided anteriorly thereby allowing entry into the bladder and exposure of the urethral catheter. The catheter balloon was deflated and the catheter was brought into the operative field and used to retract the prostate anteriorly. The posterior bladder neck was then examined and was divided allowing further dissection between the bladder and prostate posteriorly until the vasa deferentia and seminal vessels were identified. The vasa deferentia were isolated, divided, and lifted anteriorly. The seminal vesicles were dissected down to their tips with care to control the seminal vascular arterial blood supply. These structures were then lifted anteriorly and the space between Denonvillier's fascia and the anterior rectum was developed with a combination of  sharp and blunt dissection.  This isolated the vascular pedicles of the prostate.  The lateral prostatic fascia was then sharply incised allowing release of the neurovascular bundles bilaterally. The vascular pedicles of the prostate were then ligated with Weck clips between the prostate and neurovascular bundles and divided with sharp cold scissor dissection resulting in neurovascular bundle preservation. The neurovascular bundles were then separated off the apex of the prostate and urethra bilaterally.  The urethra was then sharply transected allowing the prostate specimen to be disarticulated. The pelvis was copiously irrigated and hemostasis was ensured. There was no evidence for rectal injury.  Attention then turned to the right pelvic sidewall. The fibrofatty tissue between the external iliac vein, confluence of the iliac vessels, hypogastric artery, and Cooper's ligament was dissected free from the pelvic sidewall with care to preserve the obturator nerve. Weck clips were used for lymphostasis and hemostasis. An identical procedure was performed on the contralateral side and the lymphatic packets were removed for permanent pathologic analysis.  Attention then turned to the urethral anastomosis. A 2-0 Vicryl slip knot was placed between Denonvillier's fascia, the posterior bladder neck, and the posterior urethra to reapproximate these structures. A double-armed 3-0 Monocryl suture was then used to perform a 360 running tension-free anastomosis between the bladder neck and urethra. A new urethral catheter was then placed into the bladder and irrigated. There were no blood clots within the bladder and the anastomosis appeared to be watertight. A #19 Blake drain was then brought through the left lateral 8 mm port site and positioned appropriately within the pelvis. It was secured to the skin with a nylon suture. The surgical cart was then undocked. The right lateral 12 mm port site was closed at the fascial level with a 0 Vicryl  suture placed laparoscopically. All remaining ports were then removed under direct vision. The prostate specimen was removed intact within the Endopouch retrieval bag via the periumbilical camera port site. This fascial opening was closed with two running 0 Vicryl sutures. 0.25% Marcaine was then injected into all port sites and all incisions were reapproximated at the skin level with staples. Sterile dressings were applied. The patient appeared to tolerate the procedure well and without complications. The patient was able to be extubated and transferred to the recovery unit in satisfactory condition.   Moody Bruins MD

## 2012-07-09 NOTE — Anesthesia Postprocedure Evaluation (Signed)
  Anesthesia Post-op Note  Patient: Adam Gilmore  Procedure(s) Performed: Procedure(s) (LRB): ROBOTIC ASSISTED LAPAROSCOPIC RADICAL PROSTATECTOMY LEVEL 2 (N/A) LYMPHADENECTOMY (Bilateral)  Patient Location: PACU  Anesthesia Type: General  Level of Consciousness: awake and alert   Airway and Oxygen Therapy: Patient Spontanous Breathing  Post-op Pain: mild  Post-op Assessment: Post-op Vital signs reviewed, Patient's Cardiovascular Status Stable, Respiratory Function Stable, Patent Airway and No signs of Nausea or vomiting  Last Vitals:  Filed Vitals:   07/09/12 1100  BP: 120/65  Pulse: 77  Temp: 36.4 C  Resp: 14    Post-op Vital Signs: stable   Complications: No apparent anesthesia complications

## 2012-07-10 ENCOUNTER — Encounter (HOSPITAL_COMMUNITY): Payer: Self-pay | Admitting: Urology

## 2012-07-10 MED ORDER — BISACODYL 10 MG RE SUPP
10.0000 mg | Freq: Once | RECTAL | Status: AC
  Administered 2012-07-10: 10 mg via RECTAL
  Filled 2012-07-10: qty 1

## 2012-07-10 MED ORDER — PNEUMOCOCCAL VAC POLYVALENT 25 MCG/0.5ML IJ INJ
0.5000 mL | INJECTION | INTRAMUSCULAR | Status: DC
Start: 2012-07-11 — End: 2012-07-10

## 2012-07-10 MED ORDER — HYDROCODONE-ACETAMINOPHEN 5-325 MG PO TABS: 1.0000 | ORAL_TABLET | Freq: Four times a day (QID) | ORAL | Status: DC | PRN

## 2012-07-10 NOTE — Progress Notes (Signed)
Patient ID: Adam Gilmore, male   DOB: August 25, 1950, 62 y.o.   MRN: [id number]  1 Day Post-Op Subjective: The patient is doing well.  No nausea or vomiting. Pain is adequately controlled.  Objective: Vital signs in last 24 hours: Temp:  [97.4 F (36.3 C)-98.6 F (37 C)] 98.6 F (37 C) (04/25 0648) Pulse Rate:  [56-77] 68 (04/25 0648) Resp:  [10-18] 16 (04/25 0648) BP: (105-133)/(51-70) 111/52 mmHg (04/25 0648) SpO2:  [95 %-100 %] 97 % (04/25 0648) Weight:  [87.998 kg (194 lb)] 87.998 kg (194 lb) (04/24 1504)  Intake/Output from previous day: 04/24 0701 - 04/25 0700 In: 6077.5 [P.O.:240; I.V.:4837.5; IV Piggyback:1000] Out: 4440 [Urine:4150; Drains:190; Blood:100] Intake/Output this shift: Total I/O In: 1800 [I.V.:1800] Out: 2050 [Urine:1900; Drains:150]  Physical Exam:  General: Alert and oriented. CV: RRR Lungs: Clear bilaterally. GI: Soft, Nondistended. Incisions: Dressings intact. Urine: Clear Extremities: Nontender, no erythema, no edema.  Lab Results:  Recent Labs  07/09/12 1009 07/10/12 0515  HGB 14.0 12.9*  HCT 41.0 37.8*      Assessment/Plan: POD# 1 s/p robotic prostatectomy.  1) SL IVF 2) Ambulate, Incentive spirometry 3) Transition to oral pain medication 4) Dulcolax suppository 5) D/C pelvic drain 6) Plan for likely discharge later today   Moody Bruins. MD   LOS: 1 day   Maidie Streight,LES 07/10/2012, 6:58 AM

## 2012-07-10 NOTE — Discharge Summary (Signed)
  Date of admission: 07/09/2012  Date of discharge: 07/10/2012  Admission diagnosis: Prostate Cancer  Discharge diagnosis: Prostate Cancer  History and Physical: For full details, please see admission history and physical. Briefly, Adam Gilmore is a 62 y.o. gentleman with localized prostate cancer.  After discussing management/treatment options, he elected to proceed with surgical treatment.  Hospital Course: Adam Gilmore was taken to the operating room on 07/09/2012 and underwent a robotic assisted laparoscopic radical prostatectomy. He tolerated this procedure well and without complications. Postoperatively, he was able to be transferred to a regular hospital room following recovery from anesthesia.  He was able to begin ambulating the night of surgery. He remained hemodynamically stable overnight.  He had excellent urine output with appropriately minimal output from his pelvic drain and his pelvic drain was removed on POD #1.  He was transitioned to oral pain medication, tolerated a clear liquid diet, and had met all discharge criteria and was able to be discharged home later on POD#1.  Laboratory values:  Recent Labs  07/09/12 1009 07/10/12 0515  HGB 14.0 12.9*  HCT 41.0 37.8*    Disposition: Home  Discharge instruction: He was instructed to be ambulatory but to refrain from heavy lifting, strenuous activity, or driving. He was instructed on urethral catheter care.  Discharge medications:     Medication List    STOP taking these medications       aspirin 81 MG tablet     ibuprofen 200 MG tablet  Commonly known as:  ADVIL,MOTRIN      TAKE these medications       acetaminophen 500 MG tablet  Commonly known as:  TYLENOL  Take 500 mg by mouth every 6 (six) hours as needed for pain.     ciprofloxacin 500 MG tablet  Commonly known as:  CIPRO  Take 1 tablet (500 mg total) by mouth 2 (two) times daily. Start day prior to office visit for foley removal     HYDROcodone-acetaminophen 5-325 MG per tablet  Commonly known as:  NORCO  Take 1-2 tablets by mouth every 6 (six) hours as needed for pain.        Followup: He will followup in 1 week for catheter removal and to discuss his surgical pathology results.

## 2012-09-22 ENCOUNTER — Other Ambulatory Visit: Payer: Self-pay | Admitting: Family Medicine

## 2012-09-22 DIAGNOSIS — M79605 Pain in left leg: Secondary | ICD-10-CM

## 2013-07-16 ENCOUNTER — Other Ambulatory Visit (HOSPITAL_COMMUNITY): Payer: Self-pay | Admitting: Urology

## 2013-07-16 DIAGNOSIS — E278 Other specified disorders of adrenal gland: Secondary | ICD-10-CM

## 2013-07-27 ENCOUNTER — Encounter (HOSPITAL_COMMUNITY): Payer: Self-pay

## 2013-07-27 ENCOUNTER — Ambulatory Visit (HOSPITAL_COMMUNITY)
Admission: RE | Admit: 2013-07-27 | Discharge: 2013-07-27 | Disposition: A | Discharge status: Home / Self Care | Payer: BC Managed Care – PPO | Source: Ambulatory Visit | Attending: Urology | Admitting: Urology

## 2013-07-27 DIAGNOSIS — E279 Disorder of adrenal gland, unspecified: Secondary | ICD-10-CM | POA: Insufficient documentation

## 2013-07-27 DIAGNOSIS — I708 Atherosclerosis of other arteries: Secondary | ICD-10-CM | POA: Insufficient documentation

## 2013-07-27 DIAGNOSIS — E278 Other specified disorders of adrenal gland: Secondary | ICD-10-CM

## 2013-07-27 DIAGNOSIS — I700 Atherosclerosis of aorta: Secondary | ICD-10-CM | POA: Insufficient documentation

## 2014-03-26 IMAGING — CR DG CHEST 2V
2 series · 2 of 2 positions shown · non-contrast
Comparison: 08/26/2006.

CLINICAL DATA: Preoperative chest radiograph.

CHEST - 2 VIEW

[w chest pa]
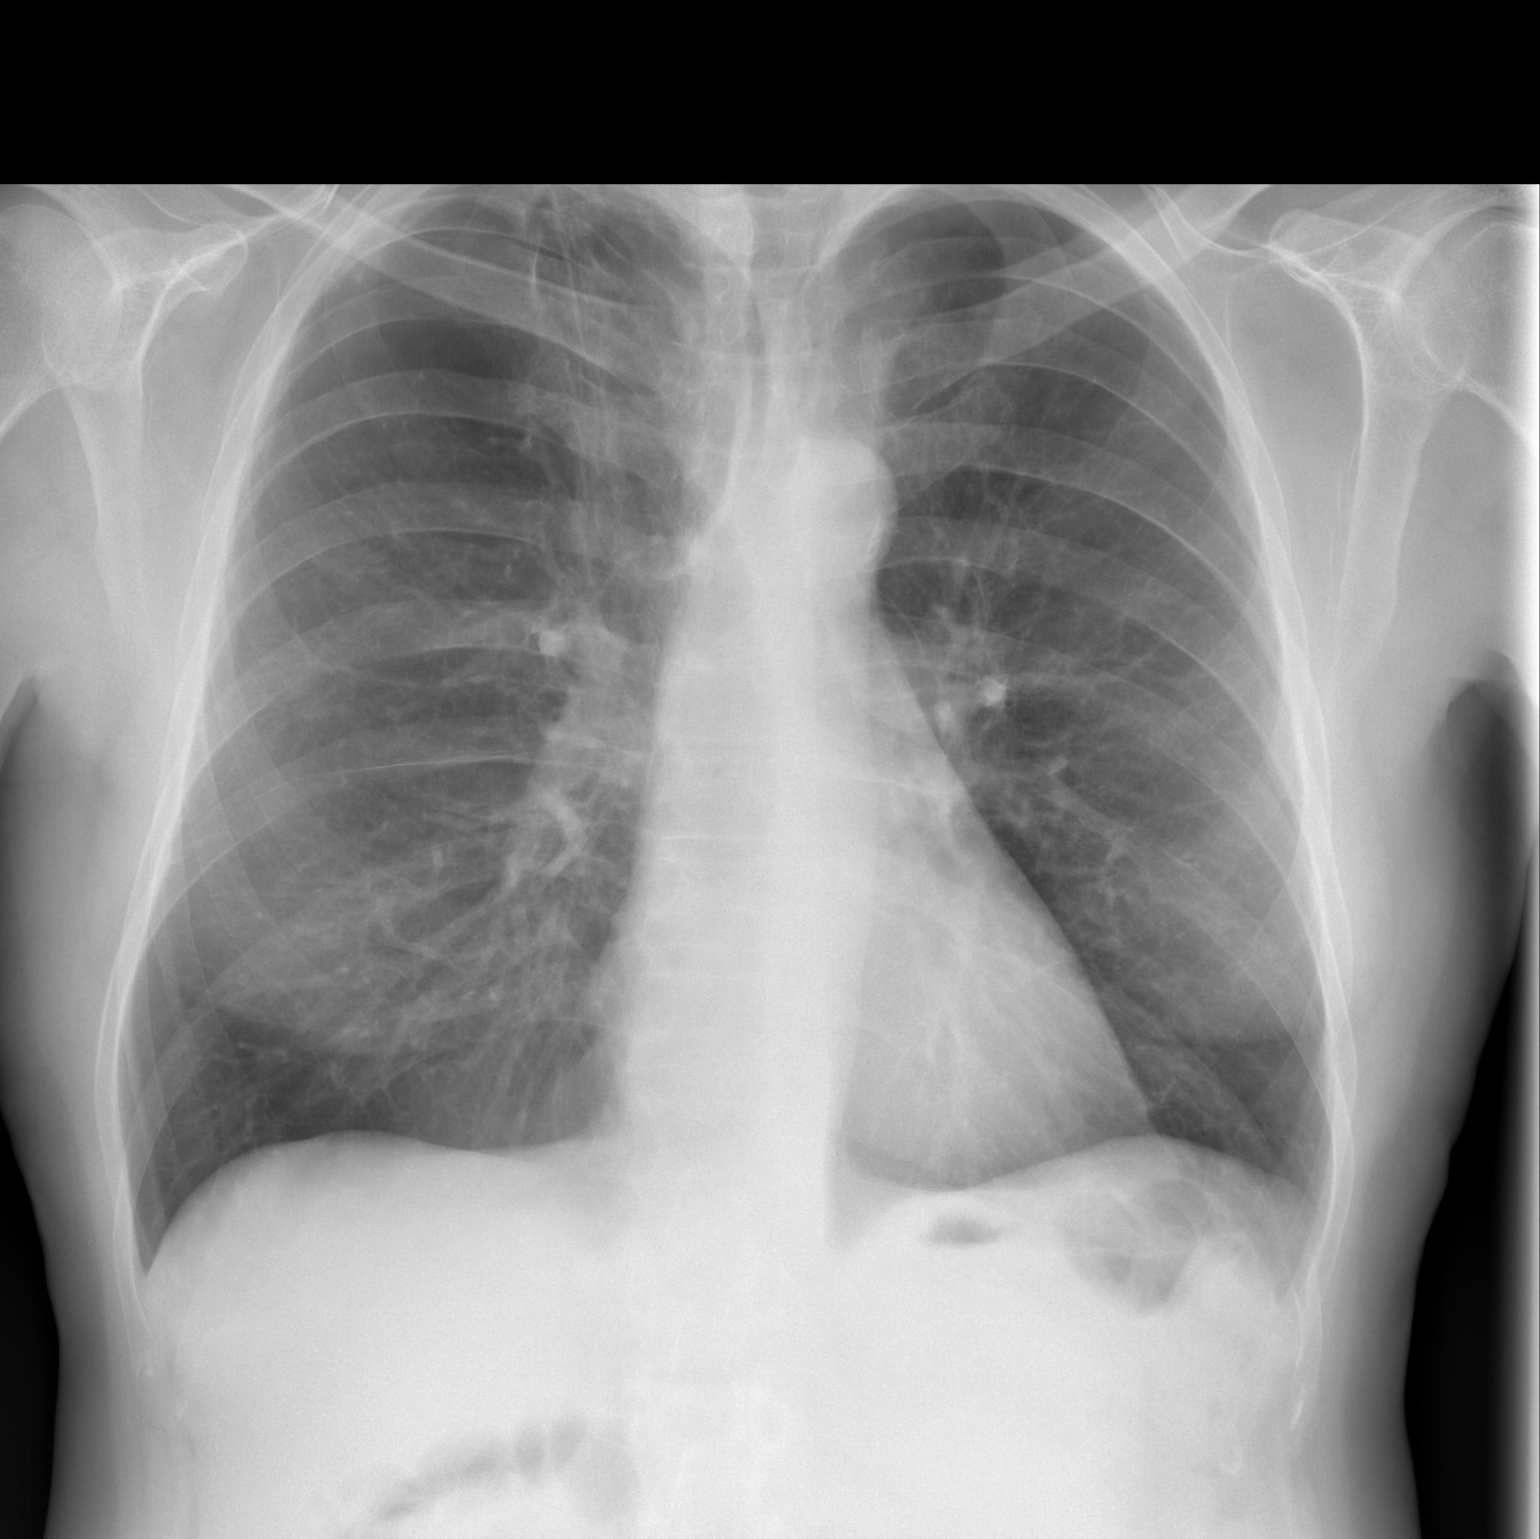

[w chest lat]
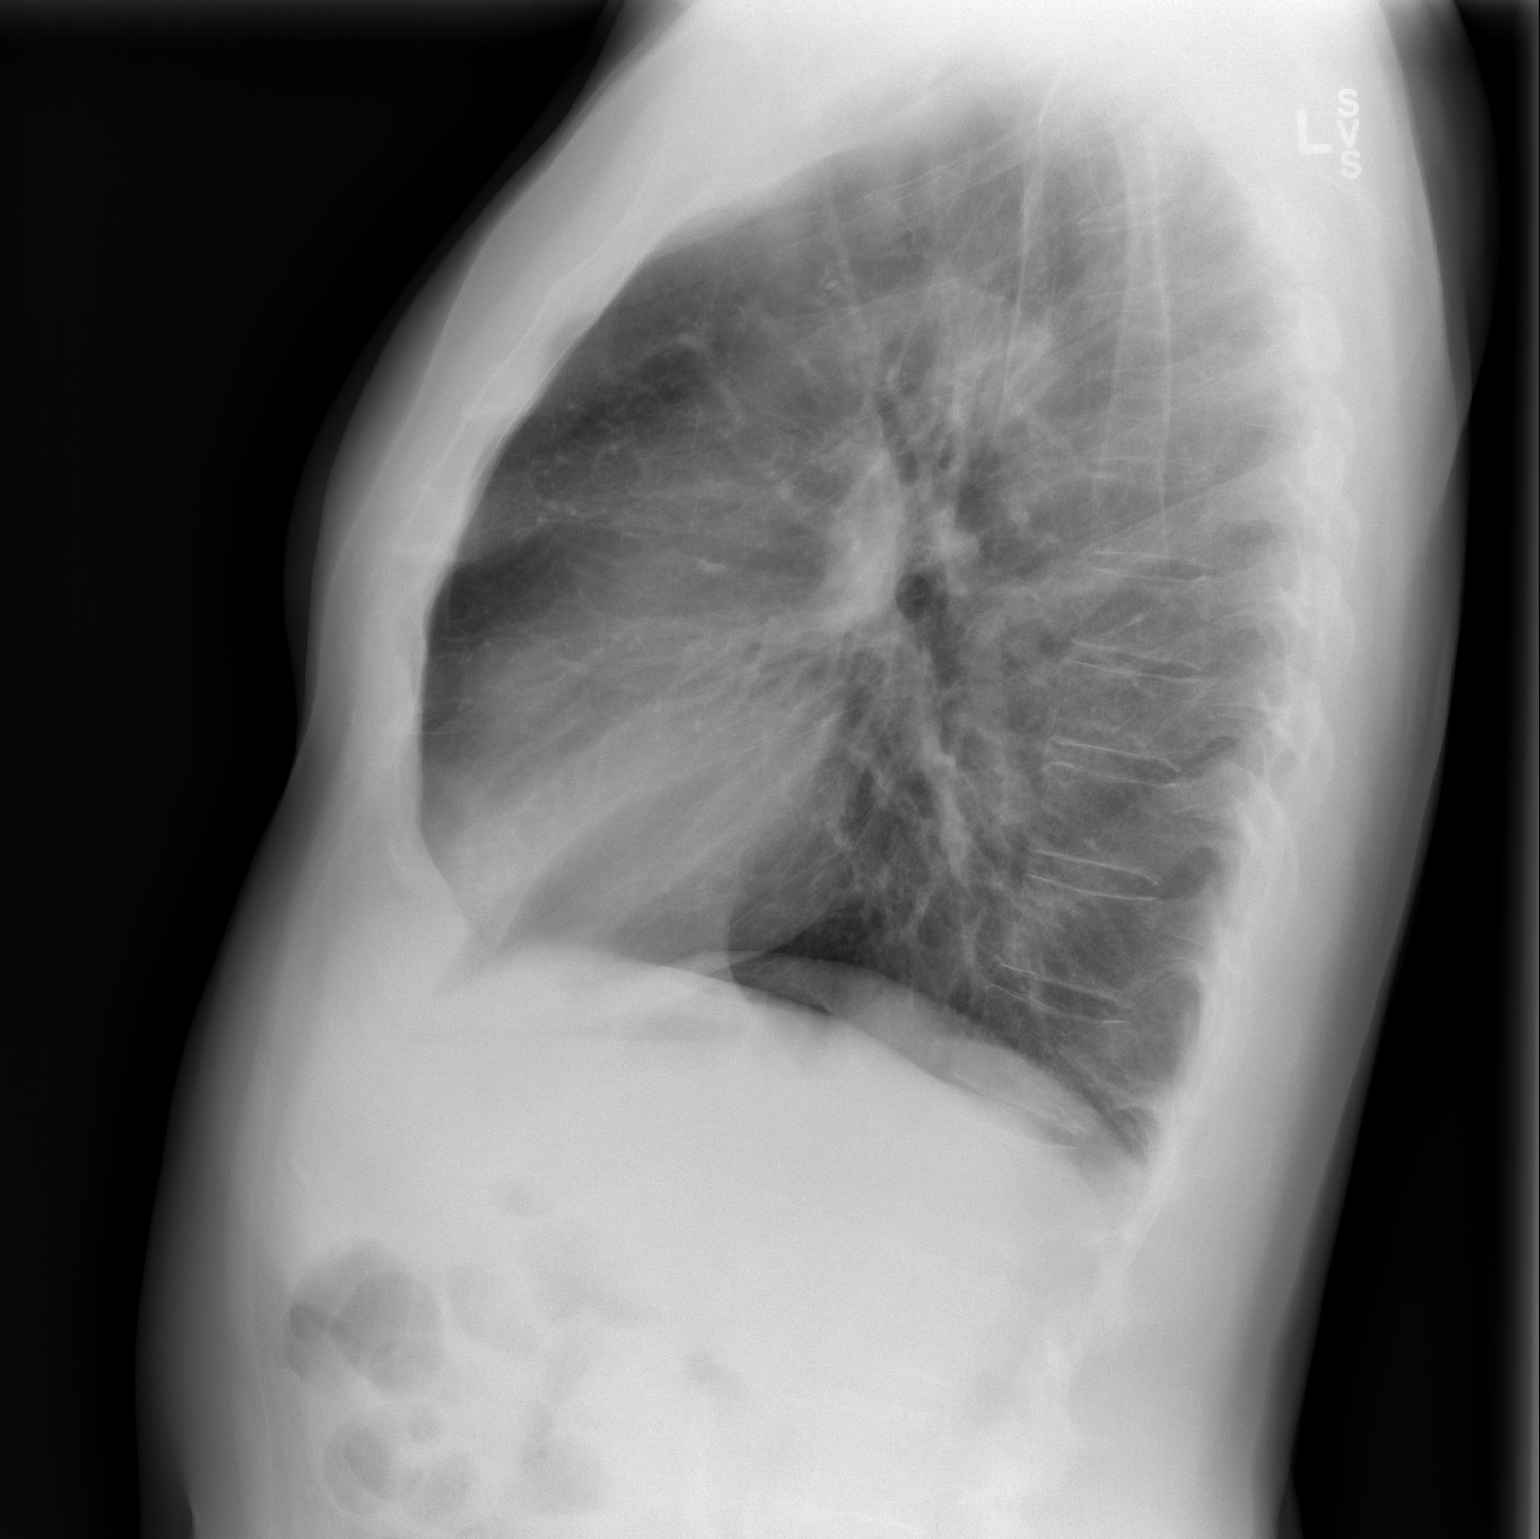

[2 of 2 positions shown; findings below may reference images not displayed]

FINDINGS: In the interval since the prior exam, asymmetric right
pleural apical thickening has progressed, and now there is linear
density radiating from the right hilum to the right apex.  Findings
suspicious for apical neoplasm such as superior sulcus tumor.
These findings were not present on the prior study.  Follow-up
chest CT, preferably with infusion recommended.  Aortic arch
atherosclerosis.  Cardiopericardial silhouette appears within
normal limits.  No plain film evidence of adenopathy.  No pleural
effusion.  No airspace disease. Underlying bullous emphysema
appears similar.
IMPRESSION: Development of right pleural apical thickening with cicatricial
changes radiating to the right hilum.  Findings suspicious for
right apical neoplasm.  Follow-up chest CT, preferably with
infusion recommended for further assessment. Bullous emphysema
producing pulmonary parenchymal distortion is possible however
further evaluation is warranted based on the interval change.

These results will be called to the ordering clinician or
representative by the Radiologist Assistant, and communication
documented in the PACS Dashboard.

## 2016-04-26 ENCOUNTER — Ambulatory Visit: Payer: Self-pay | Admitting: Surgery

## 2016-04-26 NOTE — H&P (Signed)
Adam Gilmore 04/26/2016 3:28 PM Location: [facility] Patient #: [id number] DOB: 12/02/1950 Married / Language: English / Race: White Male  History of Present Illness (Adam Gilmore; 04/26/2016 4:05 PM) Patient words: This is a very nice 66-year-old gentleman who presents with a recurrent incisional hernia just superior to the umbilicus. He developed a hernia initially following his robotic-assisted laparoscopic radical prostatectomy/lymphadenectomy in 2014. He subsequently underwent an emergency appendectomy in 2015 and the surgeon at that time repaired the hernia on the way out. Unfortunately it recurred and has slowly increased in size. It occasionally causes him some pain. He denies any nausea, vomiting, emesis, change in bowel function, or episodes of incarceration. He would like repaired because he wants to go golfing. He has a 1 pack a day smoker and has failed numerous attempts to quit. Other than this and his prostate cancer, he is healthy and does not take any medications. He was supposed to begin taking a baby aspirin every day but stopped this for a colonoscopy and has not resumed it. Of note he did have a left lower extremity DVT at the time of his prostatectomy which was treated with anticoagulation for several months.  He is a retired band director.  The patient is a 66 year old male.   Past Surgical History (Adam Gilmore, CMA; 04/26/2016 3:28 PM) Appendectomy Colon Polyp Removal - Colonoscopy Prostate Surgery - Removal Ventral / Umbilical Hernia Surgery Right.  Diagnostic Studies History (Adam Gilmore, CMA; 04/26/2016 3:28 PM) Colonoscopy 1-5 years ago  Allergies (Adam Gilmore, CMA; 04/26/2016 3:28 PM) No Known Drug Allergies 04/26/2016 Allergies Reconciled  Medication History (Adam Gilmore, CMA; 04/26/2016 3:29 PM) Latanoprost (0.005% Solution, Ophthalmic daily) Active. Medications Reconciled  Other Problems (Adam Gilmore, CMA; 04/26/2016  3:28 PM) Back Pain Prostate Cancer     Review of Systems (Adam Gilmore CMA; 04/26/2016 3:28 PM) General Not Present- Appetite Loss, Chills, Fatigue, Fever, Night Sweats, Weight Gain and Weight Loss. Skin Not Present- Change in Wart/Mole, Dryness, Hives, Jaundice, New Lesions, Non-Healing Wounds, Rash and Ulcer. HEENT Present- Wears glasses/contact lenses. Not Present- Earache, Hearing Loss, Hoarseness, Nose Bleed, Oral Ulcers, Ringing in the Ears, Seasonal Allergies, Sinus Pain, Sore Throat, Visual Disturbances and Yellow Eyes. Respiratory Present- Snoring. Not Present- Bloody sputum, Chronic Cough, Difficulty Breathing and Wheezing. Cardiovascular Present- Shortness of Breath and Swelling of Extremities. Not Present- Chest Pain, Difficulty Breathing Lying Down, Leg Cramps, Palpitations and Rapid Heart Rate. Gastrointestinal Not Present- Abdominal Pain, Bloating, Bloody Stool, Change in Bowel Habits, Chronic diarrhea, Constipation, Difficulty Swallowing, Excessive gas, Gets full quickly at meals, Hemorrhoids, Indigestion, Nausea, Rectal Pain and Vomiting. Male Genitourinary Present- Urine Leakage. Not Present- Blood in Urine, Change in Urinary Stream, Frequency, Impotence, Nocturia, Painful Urination and Urgency. Musculoskeletal Present- Back Pain and Joint Stiffness. Not Present- Joint Pain, Muscle Pain, Muscle Weakness and Swelling of Extremities. Neurological Not Present- Decreased Memory, Fainting, Headaches, Numbness, Seizures, Tingling, Tremor, Trouble walking and Weakness. Psychiatric Not Present- Anxiety, Bipolar, Change in Sleep Pattern, Depression, Fearful and Frequent crying. Endocrine Not Present- Cold Intolerance, Excessive Hunger, Hair Changes, Heat Intolerance, Hot flashes and New Diabetes. Hematology Present- Easy Bruising. Not Present- Blood Thinners, Excessive bleeding, Gland problems, HIV and Persistent Infections.  Vitals (Adam Gilmore CMA; 04/26/2016 3:29 PM) 04/26/2016 3:29  PM Weight: 194.2 lb Height: 72in Body Surface Area: 2.1 m Body Mass Index: 26.34 kg/m  Temp.: 98.5F  Pulse: 62 (Regular)  BP: 112/52 (Sitting, Left Arm, Standard)      Physical Exam (Adam   A. Connor Gilmore; 04/26/2016 4:04 PM)  General Note: He is alert and well-appearing  Integumentary Note: No lesions or rashes on limited exam. He does have sequelae of chronic bruising on the back of both his hands.  Head and Neck Note: No mass or thyromegaly  Eye Note: Anicteric, extraocular motion intact  ENMT Note: Moist mucous memory, good dentition  Chest and Lung Exam Note: Unlabored respirations, symmetrical air entry  Cardiovascular Note: Regular rate and rhythm, no pedal edema  Abdomen Note: Soft, nontender, nondistended. There is supraumbilical reducible hernia with an aperture of about 3-4 cm. Just below this there is a chronically incarcerated umbilical hernia which is mildly tender. No mass or organomegaly  Neurologic Note: Grossly intact, normal gait  Neuropsychiatric Note: Normal mood, flat affect, appropriate insight  Musculoskeletal Note: Symmetrical strength throughout, no deformity    Assessment & Plan (Adam Gilmore; 04/26/2016 4:07 PM)  INCISIONAL HERNIA, WITHOUT OBSTRUCTION OR GANGRENE (Principal Diagnosis) (K43.2) Story: Would like repair. I discussed laparoscopic repair with mesh. Discussed risks of bleeding, infection, pain, scarring, injury to intraabdominal structures, formation of intraabdominal scar tissue and risk of subsequent bowel obstruction, and specifically we discussed the impact of his tobacco abuse on increasing his risk of hernia recurrence. I counseled him to quit smoking before surgery. He is interested in trying chantix and will contact his PCP for a prescription. I also advised that he should resume his daily 81mg aspirin- and that it will need to be stopped for 7 days prior to surgery. 

## 2016-05-20 ENCOUNTER — Encounter (HOSPITAL_COMMUNITY): Payer: Self-pay

## 2016-05-20 ENCOUNTER — Encounter (HOSPITAL_COMMUNITY)
Admission: RE | Admit: 2016-05-20 | Discharge: 2016-05-20 | Disposition: A | Discharge status: Home / Self Care | Payer: Medicare Other | Source: Ambulatory Visit | Attending: Surgery | Admitting: Surgery

## 2016-05-20 DIAGNOSIS — K439 Ventral hernia without obstruction or gangrene: Secondary | ICD-10-CM

## 2016-05-20 DIAGNOSIS — R001 Bradycardia, unspecified: Secondary | ICD-10-CM | POA: Insufficient documentation

## 2016-05-20 DIAGNOSIS — E7800 Pure hypercholesterolemia, unspecified: Secondary | ICD-10-CM

## 2016-05-20 DIAGNOSIS — I440 Atrioventricular block, first degree: Secondary | ICD-10-CM | POA: Insufficient documentation

## 2016-05-20 DIAGNOSIS — F17210 Nicotine dependence, cigarettes, uncomplicated: Secondary | ICD-10-CM | POA: Diagnosis not present

## 2016-05-20 DIAGNOSIS — Z7982 Long term (current) use of aspirin: Secondary | ICD-10-CM

## 2016-05-20 DIAGNOSIS — Z01818 Encounter for other preprocedural examination: Secondary | ICD-10-CM | POA: Insufficient documentation

## 2016-05-20 DIAGNOSIS — K432 Incisional hernia without obstruction or gangrene: Secondary | ICD-10-CM | POA: Diagnosis present

## 2016-05-20 DIAGNOSIS — Z01812 Encounter for preprocedural laboratory examination: Secondary | ICD-10-CM | POA: Insufficient documentation

## 2016-05-20 DIAGNOSIS — Z8546 Personal history of malignant neoplasm of prostate: Secondary | ICD-10-CM | POA: Insufficient documentation

## 2016-05-20 DIAGNOSIS — Z86718 Personal history of other venous thrombosis and embolism: Secondary | ICD-10-CM | POA: Diagnosis not present

## 2016-05-20 LAB — CBC WITH DIFFERENTIAL/PLATELET
Basophils Absolute: 0 10*3/uL (ref 0.0–0.1)
Basophils Relative: 0 %
EOS ABS: 0.1 10*3/uL (ref 0.0–0.7)
EOS PCT: 1 %
HCT: 46.4 % (ref 39.0–52.0)
Hemoglobin: 15.7 g/dL (ref 13.0–17.0)
LYMPHS ABS: 2 10*3/uL (ref 0.7–4.0)
LYMPHS PCT: 29 %
MCH: 32.8 pg (ref 26.0–34.0)
MCHC: 33.8 g/dL (ref 30.0–36.0)
MCV: 96.9 fL (ref 78.0–100.0)
MONO ABS: 0.5 10*3/uL (ref 0.1–1.0)
Monocytes Relative: 7 %
Neutro Abs: 4.3 10*3/uL (ref 1.7–7.7)
Neutrophils Relative %: 63 %
PLATELETS: 197 10*3/uL (ref 150–400)
RBC: 4.79 MIL/uL (ref 4.22–5.81)
RDW: 12.6 % (ref 11.5–15.5)
WBC: 6.9 10*3/uL (ref 4.0–10.5)

## 2016-05-20 LAB — PROTIME-INR
INR: 1.01
PROTHROMBIN TIME: 13.3 s (ref 11.4–15.2)

## 2016-05-20 LAB — COMPREHENSIVE METABOLIC PANEL
ALT: 13 U/L — ABNORMAL LOW (ref 17–63)
ANION GAP: 4 — AB (ref 5–15)
AST: 14 U/L — ABNORMAL LOW (ref 15–41)
Albumin: 3.8 g/dL (ref 3.5–5.0)
Alkaline Phosphatase: 52 U/L (ref 38–126)
BUN: 9 mg/dL (ref 6–20)
CHLORIDE: 106 mmol/L (ref 101–111)
CO2: 29 mmol/L (ref 22–32)
CREATININE: 1.04 mg/dL (ref 0.61–1.24)
Calcium: 9.2 mg/dL (ref 8.9–10.3)
Glucose, Bld: 78 mg/dL (ref 65–99)
POTASSIUM: 4.2 mmol/L (ref 3.5–5.1)
SODIUM: 139 mmol/L (ref 135–145)
Total Bilirubin: 0.6 mg/dL (ref 0.3–1.2)
Total Protein: 6.3 g/dL — ABNORMAL LOW (ref 6.5–8.1)

## 2016-05-20 LAB — APTT: APTT: 30 s (ref 24–36)

## 2016-05-20 MED ORDER — CHLORHEXIDINE GLUCONATE CLOTH 2 % EX PADS: 6.0000 | MEDICATED_PAD | Freq: Once | CUTANEOUS | Status: DC

## 2016-05-20 NOTE — Progress Notes (Addendum)
PCP: Dr. [name]  Cardiologist: patient denies  EKG: denies past year  Stress Test:denies ever  [place] ever  [place] ever  Chest x-ray:denies past year

## 2016-05-20 NOTE — Pre-Procedure Instructions (Signed)
    Jaquise Canario  05/20/2016      [id number] [address], [place] [address] Phone: [phone] Fax: [phone]    Your procedure is scheduled on Wed. Mar. 7 @ 1035 AM.  Report to [facility] at 830 A.M.  Call this number if you have problems the morning of surgery:  [phone]   Remember:  Do not eat food or drink liquids after midnight.  Take these medicines the morning of surgery with A SIP OF WATER acetaminophen (tylenol).  Stop taking Aspirin 81 mg, any herbal medication or vitamins, Goody's, BC's, Ibuprofen, Aleve, Advil.   Do not wear jewelry, make-up or nail polish.  Do not wear lotions, powders, or colognes, or deoderant.   Men may shave face and neck.  Do not bring valuables to the hospital.   [facility] is not responsible for any belongings or valuables.  Contacts, dentures or bridgework may not be worn into surgery.  Leave your suitcase in the car.  After surgery it may be brought to your room.  For patients admitted to the hospital, discharge time will be determined by your treatment team.  Patients discharged the day of surgery will not be allowed to drive home.    Special instructions:  See attached  Please read over the following fact sheets that you were given. Pain Booklet, Coughing and Deep Breathing and Surgical Site Infection Prevention

## 2016-05-21 NOTE — Progress Notes (Addendum)
Anesthesia chart review: Patient is a 66 year old male scheduled for laparoscopic ventral hernia repair with mesh on 05/22/2016 by Dr. Romana Juniper.  History includes smoking, hypercholesterolemia, prostate cancer s/p robotic assisted laparoscopic radical prostatectomy 07/09/12, appendectomy, tonsillectomy.  PCP is Dr. [name].  Meds include aspirin 81 mg (on hold), Xalatan ophthalmic.  BP (!) 124/59   Pulse 65   Temp 36.7 C   Resp 20   Ht 6' (1.829 m)   Wt 189 lb 14.4 oz (86.1 kg)   SpO2 98%   BMI 25.76 kg/m   EKG 05/20/16: SB at 55 bpm, first degree AV block.  Preoperative labs noted.  If no acute changes then I anticipate that he can proceed as planned.  George Hugh [facility]/Anesthesiology Phone [phone] 05/21/2016 11:47 AM

## 2016-05-22 ENCOUNTER — Ambulatory Visit (HOSPITAL_COMMUNITY): Payer: Medicare Other | Admitting: Anesthesiology

## 2016-05-22 ENCOUNTER — Ambulatory Visit (HOSPITAL_COMMUNITY)
Admission: RE | Admit: 2016-05-22 | Discharge: 2016-05-22 | Disposition: A | Discharge status: Home / Self Care | Payer: Medicare Other | Source: Ambulatory Visit | Attending: Surgery | Admitting: Surgery

## 2016-05-22 ENCOUNTER — Encounter (HOSPITAL_COMMUNITY)
Admission: RE | Disposition: A | Discharge status: Home / Self Care | Payer: Self-pay | Source: Ambulatory Visit | Attending: Surgery

## 2016-05-22 ENCOUNTER — Ambulatory Visit (HOSPITAL_COMMUNITY): Payer: Medicare Other | Admitting: Vascular Surgery

## 2016-05-22 ENCOUNTER — Encounter (HOSPITAL_COMMUNITY): Payer: Self-pay | Admitting: *Deleted

## 2016-05-22 DIAGNOSIS — K432 Incisional hernia without obstruction or gangrene: Secondary | ICD-10-CM | POA: Diagnosis not present

## 2016-05-22 DIAGNOSIS — Z8546 Personal history of malignant neoplasm of prostate: Secondary | ICD-10-CM | POA: Insufficient documentation

## 2016-05-22 DIAGNOSIS — F17210 Nicotine dependence, cigarettes, uncomplicated: Secondary | ICD-10-CM | POA: Insufficient documentation

## 2016-05-22 DIAGNOSIS — E7800 Pure hypercholesterolemia, unspecified: Secondary | ICD-10-CM | POA: Insufficient documentation

## 2016-05-22 DIAGNOSIS — Z86718 Personal history of other venous thrombosis and embolism: Secondary | ICD-10-CM | POA: Diagnosis not present

## 2016-05-22 HISTORY — PX: VENTRAL HERNIA REPAIR: SHX424

## 2016-05-22 HISTORY — PX: INSERTION OF MESH: SHX5868

## 2016-05-22 SURGERY — REPAIR, HERNIA, VENTRAL, LAPAROSCOPIC
Anesthesia: General

## 2016-05-22 MED ORDER — CELECOXIB 200 MG PO CAPS
ORAL_CAPSULE | ORAL | Status: AC
  Filled 2016-05-22: qty 1

## 2016-05-22 MED ORDER — FENTANYL CITRATE (PF) 100 MCG/2ML IJ SOLN
INTRAMUSCULAR | Status: DC | PRN
  Administered 2016-05-22: 50 ug via INTRAVENOUS
  Administered 2016-05-22: 100 ug via INTRAVENOUS
  Administered 2016-05-22: 50 ug via INTRAVENOUS

## 2016-05-22 MED ORDER — ACETAMINOPHEN 650 MG RE SUPP: 650.0000 mg | RECTAL | Status: DC | PRN

## 2016-05-22 MED ORDER — OXYCODONE-ACETAMINOPHEN 5-325 MG PO TABS: 1.0000 | ORAL_TABLET | Freq: Four times a day (QID) | ORAL | 0 refills | Status: DC | PRN

## 2016-05-22 MED ORDER — BUPIVACAINE HCL (PF) 0.25 % IJ SOLN
INTRAMUSCULAR | Status: DC | PRN
  Administered 2016-05-22: 20 mL

## 2016-05-22 MED ORDER — LIDOCAINE HCL (CARDIAC) 20 MG/ML IV SOLN
INTRAVENOUS | Status: DC | PRN
  Administered 2016-05-22: 80 mg via INTRAVENOUS

## 2016-05-22 MED ORDER — ROCURONIUM BROMIDE 50 MG/5ML IV SOSY
PREFILLED_SYRINGE | INTRAVENOUS | Status: AC
  Filled 2016-05-22: qty 5

## 2016-05-22 MED ORDER — GABAPENTIN 300 MG PO CAPS
300.0000 mg | ORAL_CAPSULE | ORAL | Status: AC
  Administered 2016-05-22: 300 mg via ORAL

## 2016-05-22 MED ORDER — DOCUSATE SODIUM 100 MG PO CAPS: 100.0000 mg | ORAL_CAPSULE | Freq: Two times a day (BID) | ORAL | 0 refills | Status: AC

## 2016-05-22 MED ORDER — OXYCODONE HCL 5 MG PO TABS: 5.0000 mg | ORAL_TABLET | ORAL | Status: DC | PRN

## 2016-05-22 MED ORDER — ACETAMINOPHEN 325 MG PO TABS
ORAL_TABLET | ORAL | Status: AC
  Filled 2016-05-22: qty 2

## 2016-05-22 MED ORDER — LACTATED RINGERS IV SOLN
INTRAVENOUS | Status: DC
  Administered 2016-05-22 (×2): via INTRAVENOUS

## 2016-05-22 MED ORDER — SUGAMMADEX SODIUM 200 MG/2ML IV SOLN
INTRAVENOUS | Status: AC
  Filled 2016-05-22: qty 2

## 2016-05-22 MED ORDER — PROPOFOL 10 MG/ML IV BOLUS
INTRAVENOUS | Status: AC
  Filled 2016-05-22: qty 20

## 2016-05-22 MED ORDER — EPHEDRINE 5 MG/ML INJ
INTRAVENOUS | Status: AC
  Filled 2016-05-22: qty 10

## 2016-05-22 MED ORDER — ONDANSETRON HCL 4 MG/2ML IJ SOLN
INTRAMUSCULAR | Status: AC
  Filled 2016-05-22: qty 2

## 2016-05-22 MED ORDER — MIDAZOLAM HCL 2 MG/2ML IJ SOLN
INTRAMUSCULAR | Status: AC
  Filled 2016-05-22: qty 2

## 2016-05-22 MED ORDER — 0.9 % SODIUM CHLORIDE (POUR BTL) OPTIME
TOPICAL | Status: DC | PRN
  Administered 2016-05-22: 1000 mL

## 2016-05-22 MED ORDER — SODIUM CHLORIDE 0.9% FLUSH: 3.0000 mL | Freq: Two times a day (BID) | INTRAVENOUS | Status: DC

## 2016-05-22 MED ORDER — SODIUM CHLORIDE 0.9 % IV SOLN: 250.0000 mL | INTRAVENOUS | Status: DC | PRN

## 2016-05-22 MED ORDER — HYDROCODONE-ACETAMINOPHEN 7.5-325 MG PO TABS: 1.0000 | ORAL_TABLET | Freq: Once | ORAL | Status: DC | PRN

## 2016-05-22 MED ORDER — PROPOFOL 10 MG/ML IV BOLUS
INTRAVENOUS | Status: DC | PRN
  Administered 2016-05-22: 20 mg via INTRAVENOUS
  Administered 2016-05-22: 40 mg via INTRAVENOUS
  Administered 2016-05-22: 140 mg via INTRAVENOUS

## 2016-05-22 MED ORDER — SUCCINYLCHOLINE CHLORIDE 20 MG/ML IJ SOLN
INTRAMUSCULAR | Status: DC | PRN
  Administered 2016-05-22: 100 mg via INTRAVENOUS

## 2016-05-22 MED ORDER — ROCURONIUM BROMIDE 100 MG/10ML IV SOLN
INTRAVENOUS | Status: DC | PRN
  Administered 2016-05-22: 10 mg via INTRAVENOUS
  Administered 2016-05-22: 30 mg via INTRAVENOUS

## 2016-05-22 MED ORDER — CELECOXIB 200 MG PO CAPS
400.0000 mg | ORAL_CAPSULE | ORAL | Status: AC
Start: 2016-05-22 — End: 2016-05-22
  Administered 2016-05-22: 400 mg via ORAL

## 2016-05-22 MED ORDER — CEFAZOLIN SODIUM-DEXTROSE 2-4 GM/100ML-% IV SOLN
2.0000 g | INTRAVENOUS | Status: AC
  Administered 2016-05-22: 2 g via INTRAVENOUS

## 2016-05-22 MED ORDER — ACETAMINOPHEN 500 MG PO TABS: 1000.0000 mg | ORAL_TABLET | ORAL | Status: DC

## 2016-05-22 MED ORDER — SODIUM CHLORIDE 0.9% FLUSH
3.0000 mL | INTRAVENOUS | Status: DC | PRN
Start: 2016-05-22 — End: 2016-05-22

## 2016-05-22 MED ORDER — HYDROMORPHONE HCL 1 MG/ML IJ SOLN: 0.2500 mg | INTRAMUSCULAR | Status: DC | PRN

## 2016-05-22 MED ORDER — FENTANYL CITRATE (PF) 100 MCG/2ML IJ SOLN
INTRAMUSCULAR | Status: AC
  Filled 2016-05-22: qty 2

## 2016-05-22 MED ORDER — ACETAMINOPHEN 325 MG PO TABS
650.0000 mg | ORAL_TABLET | ORAL | Status: DC | PRN
  Administered 2016-05-22: 650 mg via ORAL

## 2016-05-22 MED ORDER — EPHEDRINE SULFATE 50 MG/ML IJ SOLN
INTRAMUSCULAR | Status: DC | PRN
  Administered 2016-05-22: 10 mg via INTRAVENOUS
  Administered 2016-05-22 (×2): 15 mg via INTRAVENOUS
  Administered 2016-05-22: 10 mg via INTRAVENOUS

## 2016-05-22 MED ORDER — ONDANSETRON HCL 4 MG/2ML IJ SOLN
INTRAMUSCULAR | Status: DC | PRN
  Administered 2016-05-22: 4 mg via INTRAVENOUS

## 2016-05-22 MED ORDER — SUGAMMADEX SODIUM 200 MG/2ML IV SOLN
INTRAVENOUS | Status: DC | PRN
  Administered 2016-05-22: 180 mg via INTRAVENOUS

## 2016-05-22 MED ORDER — CEFAZOLIN SODIUM-DEXTROSE 2-4 GM/100ML-% IV SOLN
INTRAVENOUS | Status: AC
  Filled 2016-05-22: qty 100

## 2016-05-22 MED ORDER — PROMETHAZINE HCL 25 MG/ML IJ SOLN: 6.2500 mg | INTRAMUSCULAR | Status: DC | PRN

## 2016-05-22 MED ORDER — SUCCINYLCHOLINE CHLORIDE 200 MG/10ML IV SOSY
PREFILLED_SYRINGE | INTRAVENOUS | Status: AC
  Filled 2016-05-22: qty 10

## 2016-05-22 MED ORDER — MIDAZOLAM HCL 2 MG/2ML IJ SOLN
INTRAMUSCULAR | Status: DC | PRN
  Administered 2016-05-22: 2 mg via INTRAVENOUS

## 2016-05-22 MED ORDER — MEPERIDINE HCL 25 MG/ML IJ SOLN: 6.2500 mg | INTRAMUSCULAR | Status: DC | PRN

## 2016-05-22 MED ORDER — LIDOCAINE 2% (20 MG/ML) 5 ML SYRINGE
INTRAMUSCULAR | Status: AC
  Filled 2016-05-22: qty 5

## 2016-05-22 MED ORDER — GABAPENTIN 300 MG PO CAPS
ORAL_CAPSULE | ORAL | Status: AC
  Filled 2016-05-22: qty 1

## 2016-05-22 MED ORDER — FENTANYL CITRATE (PF) 100 MCG/2ML IJ SOLN: 25.0000 ug | INTRAMUSCULAR | Status: DC | PRN

## 2016-05-22 MED ORDER — BUPIVACAINE HCL (PF) 0.25 % IJ SOLN
INTRAMUSCULAR | Status: AC
  Filled 2016-05-22: qty 30

## 2016-05-22 SURGICAL SUPPLY — 41 items
ADH SKN CLS APL DERMABOND .7 (GAUZE/BANDAGES/DRESSINGS) ×1
BINDER ABDOMINAL 12 ML 46-62 (SOFTGOODS) ×2 IMPLANT
BLADE CLIPPER SURG (BLADE) ×2 IMPLANT
CANISTER SUCT 3000ML PPV (MISCELLANEOUS) ×2 IMPLANT
CHLORAPREP W/TINT 26ML (MISCELLANEOUS) ×3 IMPLANT
CLOSURE STERI-STRIP 1/2X4 (GAUZE/BANDAGES/DRESSINGS) ×1
CLSR STERI-STRIP ANTIMIC 1/2X4 (GAUZE/BANDAGES/DRESSINGS) ×1 IMPLANT
COVER SURGICAL LIGHT HANDLE (MISCELLANEOUS) ×3 IMPLANT
DERMABOND ADVANCED (GAUZE/BANDAGES/DRESSINGS) ×2
DERMABOND ADVANCED .7 DNX12 (GAUZE/BANDAGES/DRESSINGS) ×1 IMPLANT
DEVICE SECURE STRAP 25 ABSORB (INSTRUMENTS) ×3 IMPLANT
DEVICE TROCAR PUNCTURE CLOSURE (ENDOMECHANICALS) ×3 IMPLANT
ELECT REM PT RETURN 9FT ADLT (ELECTROSURGICAL) ×3
ELECTRODE REM PT RTRN 9FT ADLT (ELECTROSURGICAL) ×1 IMPLANT
GLOVE BIO SURGEON STRL SZ 6 (GLOVE) ×3 IMPLANT
GLOVE BIO SURGEON STRL SZ7 (GLOVE) ×2 IMPLANT
GLOVE BIOGEL PI IND STRL 6.5 (GLOVE) ×1 IMPLANT
GLOVE BIOGEL PI INDICATOR 6.5 (GLOVE) ×2
GLOVE INDICATOR 7.0 STRL GRN (GLOVE) ×2 IMPLANT
GOWN STRL REUS W/ TWL LRG LVL3 (GOWN DISPOSABLE) ×3 IMPLANT
GOWN STRL REUS W/TWL LRG LVL3 (GOWN DISPOSABLE) ×9
KIT BASIN OR (CUSTOM PROCEDURE TRAY) ×3 IMPLANT
KIT ROOM TURNOVER OR (KITS) ×3 IMPLANT
MARKER SKIN DUAL TIP RULER LAB (MISCELLANEOUS) ×3 IMPLANT
MESH VENTRALIGHT ST 4X6IN (Mesh General) ×2 IMPLANT
NDL SPNL 22GX3.5 QUINCKE BK (NEEDLE) ×1 IMPLANT
NEEDLE SPNL 22GX3.5 QUINCKE BK (NEEDLE) ×3 IMPLANT
NS IRRIG 1000ML POUR BTL (IV SOLUTION) ×3 IMPLANT
PAD ARMBOARD 7.5X6 YLW CONV (MISCELLANEOUS) ×6 IMPLANT
SCISSORS LAP 5X35 DISP (ENDOMECHANICALS) IMPLANT
SHEARS HARMONIC ACE PLUS 36CM (ENDOMECHANICALS) IMPLANT
SLEEVE ENDOPATH XCEL 5M (ENDOMECHANICALS) ×5 IMPLANT
SUT MNCRL AB 4-0 PS2 18 (SUTURE) ×3 IMPLANT
SUT NOVA NAB GS-21 0 18 T12 DT (SUTURE) ×5 IMPLANT
TOWEL OR 17X24 6PK STRL BLUE (TOWEL DISPOSABLE) ×3 IMPLANT
TOWEL OR 17X26 10 PK STRL BLUE (TOWEL DISPOSABLE) ×3 IMPLANT
TRAY LAPAROSCOPIC MC (CUSTOM PROCEDURE TRAY) ×3 IMPLANT
TROCAR XCEL BLUNT TIP 100MML (ENDOMECHANICALS) IMPLANT
TROCAR XCEL NON-BLD 11X100MML (ENDOMECHANICALS) ×3 IMPLANT
TROCAR XCEL NON-BLD 5MMX100MML (ENDOMECHANICALS) ×3 IMPLANT
TUBING INSUFFLATION (TUBING) ×3 IMPLANT

## 2016-05-22 NOTE — Interval H&P Note (Signed)
History and Physical Interval Note:  05/22/2016 10:30 AM  Adam Gilmore  has presented today for surgery, with the diagnosis of Ventral Hernia  The various methods of treatment have been discussed with the patient and family. After consideration of risks, benefits and other options for treatment, the patient has consented to  Procedure(s): [place] (N/A) INSERTION OF MESH (N/A) as a surgical intervention .  The patient's history has been reviewed, patient examined, no change in status, stable for surgery.  I have reviewed the patient's chart and labs.  Questions were answered to the patient's satisfaction.     [place] Leap Rich Brave

## 2016-05-22 NOTE — Discharge Instructions (Signed)
[facility] ______[facility] LAPAROSCOPIC SURGERY: POST OP INSTRUCTIONS Always review your discharge instruction sheet given to you by the facility where your surgery was performed. IF YOU HAVE DISABILITY OR FAMILY LEAVE FORMS, YOU MUST BRING THEM TO THE OFFICE FOR PROCESSING.   DO NOT GIVE THEM TO YOUR DOCTOR.  1. A prescription for pain medication may be given to you upon discharge.  Take your pain medication as prescribed, if needed.  If narcotic pain medicine is not needed, then you may take acetaminophen (Tylenol) or ibuprofen (Advil) as needed. 2. Take your usually prescribed medications unless otherwise directed. 3. If you need a refill on your pain medication, please contact your pharmacy.  They will contact our office to request authorization. Prescriptions will not be filled after 5pm or on week-ends. 4. You should follow a light diet the first few days after arrival home, such as soup and crackers, etc.  Be sure to include lots of fluids daily. 5. Most patients will experience some swelling and bruising in the area of the incisions.  Ice packs will help.  Swelling and bruising can take several days to resolve.  6. It is common to experience some constipation if taking pain medication after surgery.  Increasing fluid intake and taking a stool softener (such as Colace) will usually help or prevent this problem from occurring.  A mild laxative (Milk of Magnesia or Miralax) should be taken according to package instructions if there are no bowel movements after 48 hours. 7. Unless discharge instructions indicate otherwise, you may remove your bandages 24-48 hours after surgery, and you may shower at that time.  You may have steri-strips (small skin tapes) in place directly over the incision.  These strips should be left on the skin for 7-10 days.  If your surgeon used skin glue on the incision, you may shower in 24 hours.  The glue will flake off over the next 2-3 weeks.  Any sutures or  staples will be removed at the office during your follow-up visit. 8. ACTIVITIES:  You may resume regular (light) daily activities beginning the next day--such as daily self-care, walking, climbing stairs--gradually increasing activities as tolerated.  You may have sexual intercourse when it is comfortable.  Refrain from any heavy lifting or straining until approved by your doctor. a. You may drive when you are no longer taking prescription pain medication, you can comfortably wear a seatbelt, and you can safely maneuver your car and apply brakes. b. RETURN TO WORK:  _2 weeks_____________________________________________________ 9. You should see your doctor in the office for a follow-up appointment approximately 2-3 weeks after your surgery.  Make sure that you call for this appointment within a day or two after you arrive home to insure a convenient appointment time. 10. OTHER INSTRUCTIONS: wear abdominal binder when out of bed for comfort __________________________________________________________________________________________________________________________ __________________________________________________________________________________________________________________________ WHEN TO CALL YOUR DOCTOR: 1. Fever over 101.0 2. Inability to urinate 3. Continued bleeding from incision. 4. Increased pain, redness, or drainage from the incision. 5. Increasing abdominal pain  The clinic staff is available to answer your questions during regular business hours.  Please dont hesitate to call and ask to speak to one of the nurses for clinical concerns.  If you have a medical emergency, go to the nearest emergency room or call 911.  A surgeon from [facility] is always on call at the hospital. [address] [place], [place], [place]  [address] ? [address], [place], [place]   [address] (  [phone] ? [phone] ? FAX [phone] Web site: [email or web address]

## 2016-05-22 NOTE — Transfer of Care (Signed)
Immediate Anesthesia Transfer of Care Note  Patient: Adam Gilmore  Procedure(s) Performed: Procedure(s): LAPAROSCOPIC VENTRAL HERNIA REPAIR WITH MESH (N/A) INSERTION OF MESH (N/A)  Patient Location: PACU  Anesthesia Type:General  Level of Consciousness: lethargic and responds to stimulation  Airway & Oxygen Therapy: Patient Spontanous Breathing and Patient connected to face mask oxygen  Post-op Assessment: Report given to RN  Post vital signs: Reviewed and stable  Last Vitals:  Vitals:   05/22/16 0849  BP: 122/61  Pulse: 60  Resp: 20  Temp: 36.8 C    Last Pain:  Vitals:   05/22/16 0849  TempSrc: Oral         Complications: No apparent anesthesia complications

## 2016-05-22 NOTE — Op Note (Signed)
Operative Note  Adam Gilmore  [id number]  [id number]  05/22/2016   Surgeon: Clovis Riley  Assistant: OR staff  Procedure performed: laparoscopic repair of recurrent incisional hernia with mesh  Preop diagnosis: recurrent incisional hernia, reducible Post-op diagnosis/intraop findings: same  Specimens: none Retained items: none EBL: 10FB Complications: none  Description of procedure: After obtaining informed consent the patient was taken to the operating room and placed supine on operating room table wheregeneral endotracheal anesthesia was initiated, preoperative antibiotics were administered, SCDs applied, and a formal timeout was performed. His abdomen was prepped and draped in the usual sterile fashion. The peritoneal cavity was accessed using a Visiport technique in the left upper quadrant and insufflated to 15 mmHg without incident. The hernia was visible just superior to the umbilicus. There actually was not a hernia defect at the level of the umbilicus where there was chronically incarcerated fat appreciated on exam. A 11 mm trocar and a 5 mm trocar were placed along the left hemiabdomen under direct visualization. A small tongue of omentum was partially stuck within the supraumbilical incisional hernia and this was easily reduced with sharp dissection and cautery. The falciform ligament was mobilized off of the anterior abdominal wall using sharp dissection and cautery ensuring appropriate hemostasis. The falciform had been partially incarcerated within the hernia and it was dissected far enough to allow landing zone for the mesh. The hernia sac was then reduced and excised and discarded. The hernia was measured at 7.5 x 5 cm prior to fascial reapproximation, or 2 x 3". Thus a 4 x 6" ventralight mesh was used for the repair. Prior to insertion of the mesh, a small stab incision was made in the skin overlying the hernia defect and a PMI suture passer was used to reapproximate  the fascia with interrupted 0 Novafils alternating with 0 Ethibonds. The mesh was then brought into the field, and the skin was marked with the anticipated locations of the fastening sutures. The rough side of the mesh was marked and 0 Novafil sutures were affixed to the mesh at the top, bottom and both sides. The mesh was then moistened and introduced the peritoneal cavity through the 11 mm trocar. 11 blade was used to make stab incisions over the previously marked skin sites and a PMI suture passer was used to bring up the previously affixed ends of the Novafil sutures to tack the mesh to the anterior abdominal wall ensuring that the rough side was towards the abdominal wall and the smooth side was towards the viscera. Once I had brought all of the suture tails out, they were all tied down and the mesh was noted to sit nicely over the hernia defect with at least 3 cm of overlap circumferentially. A secure strap tacker was then used to tack the edges of the mesh to the intra-abdominal wall circumferentially. An inner crown of tacks was also applied. An additional right lower quadrant 5 mm trocar was placed in order to appropriately tack the left and superior aspect of the mesh. Upon completion, the mesh was well affixed to the anterior abdominal wall with no exposed rough edges. Hemostasis was confirmed once more. The 11 mm trocar was removed and the fascia here with was closed with a 0 Vicryl using a PMI device under direct visualization.  The skin incisions were closed with running subcuticular Monocryl. Dermabond was applied to the trocar sites and Steri-Strips were used to close the stab incisions. An abdominal binder was then placed. The  patient was then awakened, extubated and taken to PACU in stable condition.   All counts were correct at the completion of the case.

## 2016-05-22 NOTE — Anesthesia Procedure Notes (Signed)
Procedure Name: Intubation Date/Time: 05/22/2016 10:44 AM Performed by: Barrington Ellison Pre-anesthesia Checklist: Patient identified, Emergency Drugs available, Suction available and Patient being monitored Patient Re-evaluated:Patient Re-evaluated prior to inductionOxygen Delivery Method: Circle System Utilized Preoxygenation: Pre-oxygenation with 100% oxygen Intubation Type: IV induction and Rapid sequence Ventilation: Mask ventilation without difficulty Laryngoscope Size: Glidescope Grade View: Grade I Tube type: Oral Tube size: 7.5 mm Number of attempts: 1 Airway Equipment and Method: Stylet and Oral airway Placement Confirmation: ETT inserted through vocal cords under direct vision,  positive ETCO2 and breath sounds checked- equal and bilateral Secured at: 22 cm Tube secured with: Tape Dental Injury: Teeth and Oropharynx as per pre-operative assessment  Difficulty Due To: Difficulty was anticipated and Difficult Airway- due to anterior larynx

## 2016-05-22 NOTE — H&P (View-Only) (Signed)
Adam Gilmore 04/26/2016 3:28 PM Location: [facility] Patient #: [id number] DOB: [birth year] Married / Language: English / Race: White Male  History of Present Illness (Lucan Riner A. Kae Heller MD; 04/26/2016 4:05 PM) Patient words: This is a very nice 66 year old gentleman who presents with a recurrent incisional hernia just superior to the umbilicus. He developed a hernia initially following his robotic-assisted laparoscopic radical prostatectomy/lymphadenectomy in 2014. He subsequently underwent an emergency appendectomy in 2015 and the surgeon at that time repaired the hernia on the way out. Unfortunately it recurred and has slowly increased in size. It occasionally causes him some pain. He denies any nausea, vomiting, emesis, change in bowel function, or episodes of incarceration. He would like repaired because he wants to go golfing. He has a 1 pack a day smoker and has failed numerous attempts to quit. Other than this and his prostate cancer, he is healthy and does not take any medications. He was supposed to begin taking a baby aspirin every day but stopped this for a colonoscopy and has not resumed it. Of note he did have a left lower extremity DVT at the time of his prostatectomy which was treated with anticoagulation for several months.  He is a retired Water quality scientist.  The patient is a 66 year old male.   Past Surgical History Nance Pear, [place]; 04/26/2016 3:28 PM) Appendectomy Colon Polyp Removal - Colonoscopy Prostate Surgery - Removal Ventral / Umbilical Hernia Surgery Right.  Diagnostic Studies History Nance Pear, [place]; 04/26/2016 3:28 PM) Colonoscopy 1-5 years ago  Allergies (Luke, [place]; 04/26/2016 3:28 PM) No Known Drug Allergies 04/26/2016 Allergies Reconciled  Medication History Nance Pear, CMA; 04/26/2016 3:29 PM) Latanoprost (0.005% Solution, Ophthalmic daily) Active. Medications Reconciled  Other Problems Nance Pear, [place]; 04/26/2016  3:28 PM) Back Pain Prostate Cancer     Review of Systems ([place]; 04/26/2016 3:28 PM) General Not Present- Appetite Loss, Chills, Fatigue, Fever, Night Sweats, Weight Gain and Weight Loss. Skin Not Present- Change in Wart/Mole, Dryness, Hives, Jaundice, New Lesions, Non-Healing Wounds, Rash and Ulcer. HEENT Present- Wears glasses/contact lenses. Not Present- Earache, Hearing Loss, Hoarseness, Nose Bleed, Oral Ulcers, Ringing in the Ears, Seasonal Allergies, Sinus Pain, Sore Throat, Visual Disturbances and Yellow Eyes. Respiratory Present- Snoring. Not Present- Bloody sputum, Chronic Cough, Difficulty Breathing and Wheezing. Cardiovascular Present- Shortness of Breath and Swelling of Extremities. Not Present- Chest Pain, Difficulty Breathing Lying Down, Leg Cramps, Palpitations and Rapid Heart Rate. Gastrointestinal Not Present- Abdominal Pain, Bloating, Bloody Stool, Change in Bowel Habits, Chronic diarrhea, Constipation, Difficulty Swallowing, Excessive gas, Gets full quickly at meals, Hemorrhoids, Indigestion, Nausea, Rectal Pain and Vomiting. Male Genitourinary Present- Urine Leakage. Not Present- Blood in Urine, Change in Urinary Stream, Frequency, Impotence, Nocturia, Painful Urination and Urgency. Musculoskeletal Present- Back Pain and Joint Stiffness. Not Present- Joint Pain, Muscle Pain, Muscle Weakness and Swelling of Extremities. Neurological Not Present- Decreased Memory, Fainting, Headaches, Numbness, Seizures, Tingling, Tremor, Trouble walking and Weakness. Psychiatric Not Present- Anxiety, Bipolar, Change in Sleep Pattern, Depression, Fearful and Frequent crying. Endocrine Not Present- Cold Intolerance, Excessive Hunger, Hair Changes, Heat Intolerance, Hot flashes and New Diabetes. Hematology Present- Easy Bruising. Not Present- Blood Thinners, Excessive bleeding, Gland problems, HIV and Persistent Infections.  Vitals Bary Castilla Bradford CMA; 04/26/2016 3:29 PM) 04/26/2016 3:29  PM Weight: 194.2 lb Height: 72in Body Surface Area: 2.1 m Body Mass Index: 26.34 kg/m  Temp.: 98.49F  Pulse: 62 (Regular)  BP: 112/52 (Sitting, Left Arm, Standard)      Physical Exam [facility]  Rich Brave MD; 04/26/2016 4:04 PM)  General Note: He is alert and well-appearing  Integumentary Note: No lesions or rashes on limited exam. He does have sequelae of chronic bruising on the back of both his hands.  Head and Neck Note: No mass or thyromegaly  Eye Note: Anicteric, extraocular motion intact  ENMT Note: Moist mucous memory, good dentition  Chest and Lung Exam Note: Unlabored respirations, symmetrical air entry  Cardiovascular Note: Regular rate and rhythm, no pedal edema  Abdomen Note: Soft, nontender, nondistended. There is supraumbilical reducible hernia with an aperture of about 3-4 cm. Just below this there is a chronically incarcerated umbilical hernia which is mildly tender. No mass or organomegaly  Neurologic Note: Grossly intact, normal gait  Neuropsychiatric Note: Normal mood, flat affect, appropriate insight  Musculoskeletal Note: Symmetrical strength throughout, no deformity    Assessment & Plan (Paitynn Mikus A. Kae Heller MD; 04/26/2016 4:07 PM)  INCISIONAL HERNIA, WITHOUT OBSTRUCTION OR GANGRENE (Principal Diagnosis) (K43.2) Story: Would like repair. I discussed laparoscopic repair with mesh. Discussed risks of bleeding, infection, pain, scarring, injury to intraabdominal structures, formation of intraabdominal scar tissue and risk of subsequent bowel obstruction, and specifically we discussed the impact of his tobacco abuse on increasing his risk of hernia recurrence. I counseled him to quit smoking before surgery. He is interested in trying chantix and will contact his PCP for a prescription. I also advised that he should resume his daily 81mg  aspirin- and that it will need to be stopped for 7 days prior to surgery.

## 2016-05-22 NOTE — Anesthesia Preprocedure Evaluation (Signed)
Anesthesia Evaluation  Patient identified by MRN, date of birth, ID band Patient awake    Reviewed: Allergy & Precautions, NPO status , Patient's Chart, lab work & pertinent test results  Airway Mallampati: II       Dental  (+) Chipped,    Pulmonary Current Smoker,    Pulmonary exam normal breath sounds clear to auscultation       Cardiovascular negative cardio ROS Normal cardiovascular exam Rhythm:Regular Rate:Normal     Neuro/Psych negative neurological ROS  negative psych ROS   GI/Hepatic negative GI ROS, Neg liver ROS,   Endo/Other  Hypercholesterolemia  Renal/GU negative Renal ROS   Hx/o Prostate Ca - S/P robotic prostatectomy    Musculoskeletal Ventral hernia   Abdominal   Peds  Hematology   Anesthesia Other Findings   Reproductive/Obstetrics                             Lab Results  Component Value Date   WBC 6.9 05/20/2016   HGB 15.7 05/20/2016   HCT 46.4 05/20/2016   MCV 96.9 05/20/2016   PLT 197 05/20/2016     Chemistry      Component Value Date/Time   NA 139 05/20/2016 1530   K 4.2 05/20/2016 1530   CL 106 05/20/2016 1530   CO2 29 05/20/2016 1530   BUN 9 05/20/2016 1530   CREATININE 1.04 05/20/2016 1530      Component Value Date/Time   CALCIUM 9.2 05/20/2016 1530   ALKPHOS 52 05/20/2016 1530   AST 14 (L) 05/20/2016 1530   ALT 13 (L) 05/20/2016 1530   BILITOT 0.6 05/20/2016 1530     EKG: sinus bradycardia 55/min, 1st deg AV Block.  Anesthesia Physical Anesthesia Plan  ASA: II  Anesthesia Plan: General   Post-op Pain Management:    Induction: Intravenous  Airway Management Planned: Oral ETT  Additional Equipment:   Intra-op Plan:   Post-operative Plan: Extubation in OR  Informed Consent: I have reviewed the patients History and Physical, chart, labs and discussed the procedure including the risks, benefits and alternatives for the proposed  anesthesia with the patient or authorized representative who has indicated his/her understanding and acceptance.   Dental advisory given  Plan Discussed with: Anesthesiologist, CRNA and Surgeon  Anesthesia Plan Comments:         Anesthesia Quick Evaluation

## 2016-05-22 NOTE — Anesthesia Postprocedure Evaluation (Signed)
Anesthesia Post Note  Patient: Adam Gilmore  Procedure(s) Performed: Procedure(s) (LRB): LAPAROSCOPIC VENTRAL HERNIA REPAIR WITH MESH (N/A) INSERTION OF MESH (N/A)  Patient location during evaluation: PACU Anesthesia Type: General Level of consciousness: awake and alert and oriented Pain management: pain level controlled Vital Signs Assessment: post-procedure vital signs reviewed and stable Respiratory status: spontaneous breathing, nonlabored ventilation and respiratory function stable Cardiovascular status: blood pressure returned to baseline and stable Postop Assessment: no signs of nausea or vomiting Anesthetic complications: no       Last Vitals:  Vitals:   05/22/16 1252 05/22/16 1300  BP: 134/67   Pulse: 79 74  Resp: 17 (!) 21  Temp:      Last Pain:  Vitals:   05/22/16 1252  TempSrc:   PainSc: 0-No pain                 Sherlonda Flater A.

## 2016-05-23 ENCOUNTER — Encounter (HOSPITAL_COMMUNITY): Payer: Self-pay | Admitting: Surgery

## 2016-08-23 ENCOUNTER — Emergency Department (HOSPITAL_BASED_OUTPATIENT_CLINIC_OR_DEPARTMENT_OTHER)
Admission: EM | Admit: 2016-08-23 | Discharge: 2016-08-23 | Disposition: A | Discharge status: Home / Self Care | Payer: Medicare Other | Attending: Emergency Medicine | Admitting: Emergency Medicine

## 2016-08-23 ENCOUNTER — Encounter (HOSPITAL_BASED_OUTPATIENT_CLINIC_OR_DEPARTMENT_OTHER): Payer: Self-pay

## 2016-08-23 ENCOUNTER — Emergency Department (HOSPITAL_BASED_OUTPATIENT_CLINIC_OR_DEPARTMENT_OTHER): Payer: Medicare Other

## 2016-08-23 DIAGNOSIS — I82492 Acute embolism and thrombosis of other specified deep vein of left lower extremity: Secondary | ICD-10-CM | POA: Diagnosis not present

## 2016-08-23 DIAGNOSIS — R2242 Localized swelling, mass and lump, left lower limb: Secondary | ICD-10-CM | POA: Diagnosis present

## 2016-08-23 DIAGNOSIS — Z79899 Other long term (current) drug therapy: Secondary | ICD-10-CM | POA: Diagnosis not present

## 2016-08-23 DIAGNOSIS — I824Y2 Acute embolism and thrombosis of unspecified deep veins of left proximal lower extremity: Secondary | ICD-10-CM

## 2016-08-23 DIAGNOSIS — F17210 Nicotine dependence, cigarettes, uncomplicated: Secondary | ICD-10-CM | POA: Diagnosis not present

## 2016-08-23 MED ORDER — RIVAROXABAN (XARELTO) VTE STARTER PACK (15 & 20 MG): 15.0000 mg | ORAL_TABLET | Freq: Two times a day (BID) | ORAL | 0 refills | Status: DC

## 2016-08-23 MED ORDER — RIVAROXABAN 15 MG PO TABS
15.0000 mg | ORAL_TABLET | Freq: Once | ORAL | Status: AC
  Administered 2016-08-23: 15 mg via ORAL
  Filled 2016-08-23: qty 1

## 2016-08-23 MED FILL — XARELTO STARTER PACK: 15 & 20 | 30 days supply | Qty: 51 | Fill #0

## 2016-08-23 NOTE — Discharge Instructions (Signed)
Today your ultrasound showed concern for DVT. Please start taking several toe again. Please take the 50 mg twice a day for the first 21 days. After that, please take 20 mg once a day. Please schedule a follow-up appointment with both PCP and possibly vascular team for further management. If any symptoms change or worsen or he develop any symptoms of chest pain or shortness of breath, please return to the nearest emergency department.

## 2016-08-23 NOTE — ED Provider Notes (Signed)
[facility] Provider Note   CSN: [id number] Arrival date & time: 08/23/16  1411     History   Chief Complaint Chief Complaint  Patient presents with  . Leg Swelling    HPI Adam Gilmore is a 66 y.o. male.  The history is provided by the patient and medical records.  Leg Pain   This is a recurrent problem. The current episode started more than 1 week ago. The problem occurs constantly. The problem has been gradually improving. The pain is present in the left lower leg. The quality of the pain is described as aching. The pain is at a severity of 2/10. The pain is mild. Pertinent negatives include no numbness and full range of motion. He has tried nothing for the symptoms. The treatment provided no relief. There has been no history of extremity trauma.    Past Medical History:  Diagnosis Date  . Hypercholesterolemia   . prostate ca dx'd 03/2012   surg only    There are no active problems to display for this patient.   Past Surgical History:  Procedure Laterality Date  . APPENDECTOMY     June 14, 2015  . COLONOSCOPY  1/14  . INSERTION OF MESH N/A 05/22/2016   Procedure: INSERTION OF MESH;  Surgeon: Clovis Riley, MD;  Location: [facility];  Service: General;  Laterality: N/A;  . LYMPHADENECTOMY Bilateral 07/09/2012   Procedure: Noel Journey;  Surgeon: Dutch Gray, MD;  Location: [facility];  Service: Urology;  Laterality: Bilateral;  . ROBOT ASSISTED LAPAROSCOPIC RADICAL PROSTATECTOMY N/A 07/09/2012   Procedure: ROBOTIC ASSISTED LAPAROSCOPIC RADICAL PROSTATECTOMY LEVEL 2;  Surgeon: Dutch Gray, MD;  Location: [facility];  Service: Urology;  Laterality: N/A;  . TONSILLECTOMY    . VENTRAL HERNIA REPAIR N/A 05/22/2016   Procedure: LAPAROSCOPIC VENTRAL HERNIA REPAIR WITH MESH;  Surgeon: Clovis Riley, MD;  Location: [facility];  Service: General;  Laterality: N/A;       Home Medications    Prior to Admission medications   Medication Sig Start Date End Date Taking?  Authorizing Provider  acetaminophen (TYLENOL) 325 MG tablet Take 325 mg by mouth every 6 (six) hours as needed (for pain.).    [provider]  aspirin EC 81 MG tablet Take 81 mg by mouth daily.    [provider]  latanoprost (XALATAN) 0.005 % ophthalmic solution Place 1 drop into both eyes at bedtime. 03/05/16   [provider]  oxyCODONE-acetaminophen (PERCOCET/ROXICET) 5-325 MG tablet Take 1 tablet by mouth every 6 (six) hours as needed for severe pain. 05/22/16   Clovis Riley, MD    Family History No family history on file.  Social History Social History  Substance Use Topics  . Smoking status: Current Every Day Smoker    Packs/day: 1.00    Years: 46.00    Types: Cigarettes  . Smokeless tobacco: Never Used  . Alcohol use Yes     Comment: socially     Allergies   No known allergies   Review of Systems Review of Systems  Constitutional: Negative for activity change, chills, diaphoresis, fatigue and fever.  HENT: Negative for congestion and rhinorrhea.   Eyes: Negative for visual disturbance.  Respiratory: Negative for cough, choking, chest tightness, shortness of breath, wheezing and stridor.   Cardiovascular: Positive for leg swelling. Negative for chest pain and palpitations.  Gastrointestinal: Negative for abdominal distention, abdominal pain, blood in stool, constipation, diarrhea, nausea and vomiting.  Genitourinary: Negative for difficulty urinating,  dysuria and flank pain.  Musculoskeletal: Negative for back pain, gait problem, neck pain and neck stiffness.  Skin: Negative for rash and wound.  Neurological: Negative for dizziness, weakness, light-headedness, numbness and headaches.  Psychiatric/Behavioral: Negative for agitation and confusion.  All other systems reviewed and are negative.    Physical Exam Updated Vital Signs BP 126/74 (BP Location: Left Arm)   Pulse 60   Temp 98.3 F (36.8 C) (Oral)   Resp 18   Ht 6' (1.829 m)    Wt 86.6 kg (191 lb)   SpO2 97%   BMI 25.90 kg/m   Physical Exam  Constitutional: He is oriented to person, place, and time. He appears well-developed and well-nourished. No distress.  HENT:  Head: Normocephalic and atraumatic.  Mouth/Throat: Oropharynx is clear and moist. No oropharyngeal exudate.  Eyes: Conjunctivae and EOM are normal. Pupils are equal, round, and reactive to light.  Neck: Normal range of motion. Neck supple.  Cardiovascular: Normal rate, regular rhythm and intact distal pulses.   No murmur heard. Pulmonary/Chest: Effort normal and breath sounds normal. No stridor. No respiratory distress. He has no wheezes. He exhibits no tenderness.  Abdominal: Soft. There is no tenderness.  Musculoskeletal: He exhibits edema. He exhibits no tenderness.       Left lower leg: He exhibits edema. He exhibits no tenderness, no bony tenderness, no deformity and no laceration.  symmetri pulses in bilateral LE. Normal sensation. Mild LLE Edema.   Neurological: He is alert and oriented to person, place, and time. No cranial nerve deficit or sensory deficit. He exhibits normal muscle tone.  Skin: Skin is warm and dry. Capillary refill takes less than 2 seconds. He is not diaphoretic. No erythema. No pallor.  Psychiatric: He has a normal mood and affect.  Nursing note and vitals reviewed.    ED Treatments / Results  Labs (all labs ordered are listed, but only abnormal results are displayed) Labs Reviewed - No data to display  EKG  EKG Interpretation None       Radiology US Venous Img Lower Unilateral Left  Result Date: 08/23/2016 CLINICAL DATA:  Chronic left lower leg swelling.  Left DVT in 2014. EXAM: LEFT LOWER EXTREMITY VENOUS DOPPLER ULTRASOUND TECHNIQUE: Gray-scale sonography with graded compression, as well as color Doppler and duplex ultrasound were performed to evaluate the lower extremity deep venous systems from the level of the common femoral vein and including the  common femoral, femoral, profunda femoral, popliteal and calf veins including the posterior tibial, peroneal and gastrocnemius veins when visible. The superficial great saphenous vein was also interrogated. Spectral Doppler was utilized to evaluate flow at rest and with distal augmentation maneuvers in the common femoral, femoral and popliteal veins. COMPARISON:  None. FINDINGS: Contralateral Common Femoral Vein: Respiratory phasicity is normal and symmetric with the symptomatic side. No evidence of thrombus. Normal compressibility. Common Femoral Vein: No evidence of thrombus. Normal compressibility, respiratory phasicity and response to augmentation. Saphenofemoral Junction: No evidence of thrombus. Normal compressibility and flow on color Doppler imaging. Profunda Femoral Vein: No evidence of thrombus. Normal compressibility and flow on color Doppler imaging. Femoral Vein: There is a filling defect, which is primarily peripherally located, extending from the proximal superficial femoral vein into the distal superficial femoral vein with partial compression. Popliteal Vein: No evidence of thrombus. Normal compressibility, respiratory phasicity and response to augmentation. Calf Veins: No evidence of thrombus. Normal compressibility and flow on color Doppler imaging. Superficial Great Saphenous Vein: No evidence of thrombus. Normal compressibility  and flow on color Doppler imaging. Venous Reflux:  None. Other Findings:  None. IMPRESSION: There is a DVT in the left superficial femoral vein extending from proximal to distal. The strandy and peripheral appearance suggests much if not all of the DVT is chronic. It would be difficult to exclude an acute on chronic process. Electronically Signed   By: Dorise Bullion III M.D   On: 08/23/2016 15:46    Procedures Procedures (including critical care time)  Medications Ordered in ED Medications  Rivaroxaban (XARELTO) tablet 15 mg (15 mg Oral Given 08/23/16 1707)      Initial Impression / Assessment and Plan / ED Course  I have reviewed the triage vital signs and the nursing notes.  Pertinent labs & imaging results that were available during my care of the patient were reviewed by me and considered in my medical decision making (see chart for details).     Adam Gilmore is a 66 y.o. male with a past medical history significant for prostate cancer, prior appendectomy with subsequent hernia status post recent hernia surgery on 05/22/16 and history of DVT who presents from his PCP office for left leg swelling and concern for DVT. Patient says that for years ago, after his initial prostate surgery, he had a DVT. Patient was on anticoagulation, is a relative, for several months. He reports that he was taken off of this eventually. Patient says that he has not had problems with this but says his left leg "never quite got back to normal" in regards to swelling. He denies any recent injuries in his legs. He does say he is had a mild cramping pain in his left leg for the last month. Patient says for the last month he has had worsening swelling in the left leg. Patient has numbing, tingling, or weakness. Patient was seen by his PCP today who sent him in for ultrasound. Patient does say that for the last month he has been taking some of the leftovers a relative pills from his previous prescription. He says the symptoms include feeling better. He specifically denies any chest pain, shortness of breath, lightheadedness, or fatigue. He denies any symptoms concerning for pulmonary embolism.  History and exam are seen. On exam, patient has rare mild swelling in the left leg. Normal pulses, sensation, and strength. No other abnormalities on exam. Lungs clear and chest nontender.  Ultrasound was performed upon arrival to look for DVT. Ultrasound shows concern for DVT. They cannot rule out acute on chronic process. Given the patient's 1 month of symptoms, suspect recurrent  DVT.  Patient will be started on Xarelto again. Patient will follow with his PCP and likely a hematologist or a vascular team to further manage.   Patient given one dose of Xarelto in the ED and return precautions were given for any chest pain, shortness of breath, or other problems. Given the lack of other symptoms, do not feel patient needs other workup at this time. Patient will follow up with PCP. Patient had no other complaints and was discharged in good condition.   Final Clinical Impressions(s) / ED Diagnoses   Final diagnoses:  Acute deep vein thrombosis (DVT) of proximal vein of left lower extremity ([place])    New Prescriptions Discharge Medication List as of 08/23/2016  5:01 PM    START taking these medications   Details  Rivaroxaban 15 & 20 MG TBPK Take 15 mg by mouth 2 (two) times daily. Take as directed on package: Start with one 15mg   tablet by mouth twice a day with food. On Day 22, switch to one 20mg  tablet once a day with food., Starting Fri 08/23/2016, Print        Clinical Impression: 1. Acute deep vein thrombosis (DVT) of proximal vein of left lower extremity ([facility])     Disposition: Discharge  Condition: Good  I have discussed the results, Dx and Tx plan with the pt(& family if present). He/she/they expressed understanding and agree(s) with the plan. Discharge instructions discussed at great length. Strict return precautions discussed and pt &/or family have verbalized understanding of the instructions. No further questions at time of discharge.    Discharge Medication List as of 08/23/2016  5:01 PM    START taking these medications   Details  Rivaroxaban 15 & 20 MG TBPK Take 15 mg by mouth 2 (two) times daily. Take as directed on package: Start with one 15mg  tablet by mouth twice a day with food. On Day 22, switch to one 20mg  tablet once a day with food., Starting Fri 08/23/2016, Print        Follow Up: [name], MD [address] [place] [address] [phone]  Schedule an appointment as soon as possible for a visit    Serafina Mitchell, MD [place] [address] [phone]  Schedule an appointment as soon as possible for a visit    [place] [id number] [facility] [place] [phone]  If symptoms worsen     Angelette Ganus, Gwenyth Allegra, MD 08/24/16 [phone]

## 2016-08-23 NOTE — ED Triage Notes (Signed)
Pt c/o swelling and soreness to L leg that started x 1 month ago. Pt was sent by PCP for [place].

## 2016-09-09 ENCOUNTER — Telehealth: Payer: Self-pay | Admitting: Hematology

## 2016-09-09 NOTE — Telephone Encounter (Signed)
Patient returned call re new patient appointment. Gave patient 7/13 new patient appointment with Dr. Burr Medico @ 11 am to arrive 10:30 am. Demographic and insurance information confirmed.

## 2016-09-25 NOTE — Progress Notes (Addendum)
[place]  [phone] Fax:([phone]) [place] Note   Patient Care Team: [name], MD as PCP - General (Family Medicine) 09/27/2016  Referring physician: [name], MD  CHIEF COMPLAINTS/PURPOSE OF CONSULTATION:  Recurring DVT  HISTORY OF PRESENTING ILLNESS: 09/27/16 Adam Gilmore 66 y.o. male is here because of recurring DVT. He presents to clinic with his wife. He was referred by his primary care physician Dr. Marisue Humble.   He was diagnosed with prostate cancer 4 years ago, and had prostatectomy. He resumed his routine activity quickly after the surgery. In 2-3 months after surgery, he developed left lower extremity swelling and pain, and was found to have DVT.  He was put on Xarelto and  was on for 6-8 months. He has had a mild left lower extremity edema since then.  He had an appendectomy 2 years ago and did not get blood clots after that.  He developed abdominal wall hernia from original prostate surgery where the incision was, and underwent hernia repair on 05/22/2016. He had a some pain at his surgical site for a while after surgery, and his physical activity was low for 5-6 weeks after surgery. He noticed worsening left lower extremity edema, no significant pain, so he started taking Xeloda 20 mg daily at the end of May, which was left over from 4 years ago.   On June 8th he went to the hospital, he went to ER for slightly worse left leg edema and he run out Xarelto. He was not able to get a appointment from his PCP until later this year.  He had Doppler of lower extremity in the ED, which showed DVT in the left superficial femoral vein extending from proximal to distal, likely chronic. He was discharged from meeting with refill of Xarelto.  Today he still has leg leg swelling, not as bad as previous. It has resolved but not the fully recovered from swelling.  His swelling is more reduced in the morning and worse in the evening.  When walking he feels leg soreness and it bothers him. He is back on Zeralto he is taking 20mg  once a day now. He sees a difference but still has tight feeling and soreness. He has been on it 6-8 weeks now. He takes aspirin as preventative measures. He does smoke and finds it hard for him to quit. He reports his blood clots will worsen or appear as well after long distance travels for about 3 hours.  He has an upcoming colonoscopy.     MEDICAL HISTORY:  Past Medical History:  Diagnosis Date  . Hypercholesterolemia   . prostate ca dx'd 03/2012   surg only    SURGICAL HISTORY: Past Surgical History:  Procedure Laterality Date  . APPENDECTOMY     June 14, 2015  . COLONOSCOPY  1/14  . INSERTION OF MESH N/A 05/22/2016   Procedure: INSERTION OF MESH;  Surgeon: Clovis Riley, MD;  Location: [facility];  Service: General;  Laterality: N/A;  . LYMPHADENECTOMY Bilateral 07/09/2012   Procedure: Noel Journey;  Surgeon: Dutch Gray, MD;  Location: [facility];  Service: Urology;  Laterality: Bilateral;  . ROBOT ASSISTED LAPAROSCOPIC RADICAL PROSTATECTOMY N/A 07/09/2012   Procedure: ROBOTIC ASSISTED LAPAROSCOPIC RADICAL PROSTATECTOMY LEVEL 2;  Surgeon: Dutch Gray, MD;  Location: [facility];  Service: Urology;  Laterality: N/A;  . TONSILLECTOMY    . VENTRAL HERNIA REPAIR N/A 05/22/2016   Procedure: LAPAROSCOPIC VENTRAL HERNIA REPAIR WITH MESH;  Surgeon: Clovis Riley,  MD;  Location: [place];  Service: General;  Laterality: N/A;    SOCIAL HISTORY: Social History   Social History  . Marital status: Married    Spouse name: N/A  . Number of children: N/A  . Years of education: N/A   Occupational History  . Not on file.   Social History Main Topics  . Smoking status: Current Every Day Smoker    Packs/day: 1.00    Years: 46.00    Types: Cigarettes  . Smokeless tobacco: Never Used  . Alcohol use Yes     Comment: socially  . Drug use: No  . Sexual activity: Not on file   Other Topics Concern  . Not  on file   Social History Narrative  . No narrative on file    FAMILY HISTORY: Family History  Problem Relation Age of Onset  . Cancer Father        prostate cancer  . Cancer Brother        prostate cancer     ALLERGIES:  is allergic to no known allergies.  MEDICATIONS:  Current Outpatient Prescriptions  Medication Sig Dispense Refill  . acetaminophen (TYLENOL) 325 MG tablet Take 325 mg by mouth every 6 (six) hours as needed (for pain.).    Marland Kitchen aspirin EC 81 MG tablet Take 81 mg by mouth daily.    Marland Kitchen latanoprost (XALATAN) 0.005 % ophthalmic solution Place 1 drop into both eyes at bedtime.  5  . rivaroxaban (XARELTO) 20 MG TABS tablet Take 20 mg by mouth daily with supper.     No current facility-administered medications for this visit.      REVIEW OF SYSTEMS:   Constitutional: Denies fevers, chills or abnormal night sweats Eyes: Denies blurriness of vision, double vision or watery eyes Ears, nose, mouth, throat, and face: Denies mucositis or sore throat Respiratory: Denies cough, dyspnea or wheezes Cardiovascular: Denies palpitation, chest discomfort or  (+) PE in anterior left upper leg (+) left leg swelling   Gastrointestinal:  Denies nausea, heartburn or change in bowel habits Skin: Denies abnormal skin rashes Lymphatics: Denies new lymphadenopathy or easy bruising MSK: (+) leg soreness and stiffness Neurological:Denies numbness, tingling or new weaknesses Behavioral/Psych: Mood is stable, no new changes  All other systems were reviewed with the patient and are negative.  PHYSICAL EXAMINATION: ECOG PERFORMANCE STATUS: 1 - Symptomatic but completely ambulatory  Vitals:   09/27/16 1129  BP: (!) 134/57  Pulse: (!) 55  Resp: 18  Temp: 98.3 F (36.8 C)   Filed Weights   09/27/16 1129  Weight: 191 lb 1.6 oz (86.7 kg)    GENERAL:alert, no distress and comfortable SKIN: skin color, texture, turgor are normal, no rashes or significant lesions EYES: normal,  conjunctiva are pink and non-injected, sclera clear OROPHARYNX:no exudate, no erythema and lips, buccal mucosa, and tongue normal  NECK: supple, thyroid normal size, non-tender, without nodularity LYMPH:  no palpable lymphadenopathy in the cervical, axillary or inguinal LUNGS: clear to auscultation and percussion with normal breathing effort HEART: regular rate & rhythm and no murmurs and no lower extremity edema ABDOMEN:abdomen soft, non-tender and normal bowel sounds Musculoskeletal:no cyanosis of digits and no clubbing  PSYCH: alert & oriented x 3 with fluent speech NEURO: no focal motor/sensory deficits  LABORATORY DATA:  I have reviewed the data as listed No results found for this or any previous visit (from the past 2160 hour(s)).  RADIOGRAPHIC STUDIES: I have personally reviewed the radiological images as listed and agreed with  the findings in the report. No results found.  ASSESSMENT:  Adam Gilmore is a 66 y.o. male with a history of prostate cancer and left LE DVT   1. Recurring left low extremity DVT I reviewed with the patient about the plan for care for recurrent/chronic DVT.  He developed first episodes of DVT 2-3 months after his prostate surgery, and right after a car trip to beach. This was possibly provoked. He now developed worsening left lower extremity edema after his abdominal hernia repair, and a Doppler showed chronic left femoral vein DVT, also acute components is not ruled out. Due to the chronic DVT and recurrent DVT, I recommend him to continue anticoagulation indefinitely.  ypercoagulopathy workup We discussed about the pros and cons about testing for thrombophilia disorder. His current anticoagulation therapy will interfere with some the tests and it is not possible to interpret the test results. Taking her off the anticoagulation therapy to do the tests may precipitate another thrombotic event. I recommend his to be tested for prothrombin gene mutation  and factor V Leiden which are not interfered by his anticoagulation. I do not recommend another hypercoagulopathy workup, since result is difficult to interpret and him would not change his management.  We discussed malignancy also significantly increase the risk of thrombosis. Due to his previous history of prostate cancer, I'll repeat his PSA today. His last PSA was normal 6 months ago. He has a screening colonoscopy scheduled soon. I encouraged him to keep that appointment.  Duration of anticoagulation Due to his recurrent and chronic DVT, I recommend anticoagulation indefinitely if no contraindications.  Anticoagulation options We discussed about various options of anticoagulation therapies including warfarin, low molecular weight heparin such as Lovenox or newer agents such as Rivaroxaban. Some of the risks and benefits discussed including costs involved, the need for monitoring, risks of life-threatening bleeding/hospitalization, reversibility of each agent in the event of bleeding or overdose, safety profile of each drug and taking into account other social issues such as ease of administration of medications, etc. Ultimately, we have made an informed decision for the patient to continue her treatment with Xarelto. He will continue to follow-up with his PCP for refills and monitoring.   Other preventive strategy for DVT  I recommend the patient to use elastic compression stockings at 20-30 mmHg to reduce risks of chronic thrombophlebitis.  Finally, at the end of our consultation today, I reinforced the importance of preventive strategies such as avoiding hormonal supplement, avoiding cigarette smoking, keeping up-to-date with screening programs for early cancer detection, frequent ambulation for long distance travel and aggressive DVT prophylaxis in all surgical settings.  Family counseling Another main issue we discussed today included the role of screening other family members for  thrombophilia disorder. At present time, I would not recommend testing the patient's family members as it would not benefit them. 2. Smoking cessation -He has been smoking for years.  -I discussed the increase risk of blood clots with smoking  -I strongly encouraged him to stop smoking, but to start with cutting back as he finds it hard to quit smoking. He agrees.   3. History of Prostate Cancer -He was diagnosed in April 2014. -I encouraged him to stay on top of his cancer screening  -His next colonoscopy is later this year   Plan and follow up: -I recommend him to continue Xarelto indefinitely -He will follow-up with his primary care physician for Xarelto refill and monitoring. -I'll see him as needed in the future. -  Lab for PSA, factor V beta mutation, prothrombin gene mutation, today, I'll call him next week to review his results.    Orders Placed This Encounter  Procedures  . PSA    Standing Status:   Future    Standing Expiration Date:   09/27/2017  . Prothrombin gene mutation*    Standing Status:   Future    Standing Expiration Date:   09/27/2017  . Factor 5 Leiden*    Standing Status:   Future    Standing Expiration Date:   09/27/2017     All questions were answered. The patient knows to call the clinic with any problems, questions or concerns. I spent 40 minutes counseling the patient face to face. The total time spent in the appointment was 50 minutes and more than 50% was on counseling.    This document serves as a record of services personally performed by Truitt Merle, MD. It was created on her behalf by Joslyn Devon, a trained medical scribe. The creation of this record is based on the scribe's personal observations and the provider's statements to them. This document has been checked and approved by the attending provider.    Truitt Merle, MD 09/27/2016 12:53 PM  Addendum I called the patient and are related to his lab results with him. He is a heterozygous of factor  V Leyden mutation, which is a high risk for thrombosis, I recommend him to stay on anticoagulation indefinitely if no contraindications. I recommend his daughters and siblings to be tested for factor V Leyden mutation. His prothrombin gene mutation was negative, PSA<0.1ng/ml, I'll copy this results to his primary care physician Dr. Marisue Humble and his urologist Dr. Alinda Money.  Truitt Merle  10/16/2016

## 2016-09-27 ENCOUNTER — Telehealth: Payer: Self-pay | Admitting: Hematology

## 2016-09-27 ENCOUNTER — Ambulatory Visit (HOSPITAL_BASED_OUTPATIENT_CLINIC_OR_DEPARTMENT_OTHER): Payer: Medicare Other | Admitting: Hematology

## 2016-09-27 ENCOUNTER — Ambulatory Visit (HOSPITAL_BASED_OUTPATIENT_CLINIC_OR_DEPARTMENT_OTHER): Payer: Medicare Other

## 2016-09-27 ENCOUNTER — Encounter: Payer: Self-pay | Admitting: Hematology

## 2016-09-27 DIAGNOSIS — I82512 Chronic embolism and thrombosis of left femoral vein: Secondary | ICD-10-CM | POA: Diagnosis not present

## 2016-09-27 DIAGNOSIS — Z8546 Personal history of malignant neoplasm of prostate: Secondary | ICD-10-CM

## 2016-09-27 DIAGNOSIS — Z8042 Family history of malignant neoplasm of prostate: Secondary | ICD-10-CM | POA: Diagnosis not present

## 2016-09-27 DIAGNOSIS — I82402 Acute embolism and thrombosis of unspecified deep veins of left lower extremity: Secondary | ICD-10-CM

## 2016-09-27 DIAGNOSIS — Z7901 Long term (current) use of anticoagulants: Secondary | ICD-10-CM

## 2016-09-27 DIAGNOSIS — Z720 Tobacco use: Secondary | ICD-10-CM

## 2016-09-27 NOTE — Telephone Encounter (Signed)
Scheduled appt per 7/13 los - lab today only - f/u as needed . Gave patient AVS .

## 2016-09-28 ENCOUNTER — Encounter: Payer: Self-pay | Admitting: Hematology

## 2016-09-28 LAB — PSA: Prostate Specific Ag, Serum: <0.1 ng/mL (ref 0.0–4.0)

## 2016-10-04 LAB — PROTHROMBIN GENE MUTATION

## 2016-10-04 LAB — FACTOR 5 LEIDEN

## 2016-10-14 ENCOUNTER — Telehealth: Payer: Self-pay | Admitting: Hematology

## 2016-10-14 NOTE — Telephone Encounter (Signed)
I called patient, and left her message on his home phone number regarding his lab test results. His factor V Leyden mutation test was positive, I would like to go over the test result with him, and I asked him to call back. Or if he wishes, I'll set up a follow-up appointment for him to return to see me.  Truitt Merle  10/14/2016

## 2016-11-28 ENCOUNTER — Other Ambulatory Visit: Payer: Self-pay | Admitting: Family Medicine

## 2018-12-29 ENCOUNTER — Other Ambulatory Visit (HOSPITAL_COMMUNITY): Payer: Self-pay | Admitting: Respiratory Therapy

## 2018-12-29 DIAGNOSIS — R0600 Dyspnea, unspecified: Secondary | ICD-10-CM

## 2019-04-26 ENCOUNTER — Ambulatory Visit: Payer: Medicare PPO | Attending: Internal Medicine

## 2019-04-26 DIAGNOSIS — Z23 Encounter for immunization: Secondary | ICD-10-CM | POA: Insufficient documentation

## 2019-04-26 NOTE — Progress Notes (Signed)
   Covid-19 Vaccination Clinic  Name:  Adam Gilmore    MRN: [id number] DOB: 1950/12/11  04/26/2019  [name] was observed post Covid-19 immunization for 15 minutes without incidence. He was provided with Vaccine Information Sheet and instruction to access the V-Safe system.   Adam Gilmore was instructed to call 911 with any severe reactions post vaccine: Marland Kitchen Difficulty breathing  . Swelling of your face and throat  . A fast heartbeat  . A bad rash all over your body  . Dizziness and weakness    Immunizations Administered    Name Date Dose VIS Date Route   Pfizer COVID-19 Vaccine 04/26/2019 10:49 AM 0.3 mL 02/26/2019 Intramuscular   Manufacturer: [place]   Lot: [id number]   [place]: [id number]

## 2019-05-13 ENCOUNTER — Ambulatory Visit: Payer: Medicare Other

## 2019-05-20 ENCOUNTER — Ambulatory Visit: Payer: Medicare PPO | Attending: Internal Medicine

## 2019-05-20 DIAGNOSIS — Z23 Encounter for immunization: Secondary | ICD-10-CM

## 2019-05-20 NOTE — Progress Notes (Signed)
   Covid-19 Vaccination Clinic  Name:  Adam Gilmore    MRN: [id number] DOB: 1950-04-14  05/20/2019  [name] was observed post Covid-19 immunization for 15 minutes without incident. He was provided with Vaccine Information Sheet and instruction to access the V-Safe system.   [name] was instructed to call 911 with any severe reactions post vaccine: Marland Kitchen Difficulty breathing  . Swelling of face and throat  . A fast heartbeat  . A bad rash all over body  . Dizziness and weakness   Immunizations Administered    Name Date Dose VIS Date Route   Pfizer COVID-19 Vaccine 05/20/2019  5:07 PM 0.3 mL 02/26/2019 Intramuscular   Manufacturer: [place]   Lot: [id number]   [place]: [id number]

## 2020-01-26 NOTE — Progress Notes (Signed)
Histology and Location of Primary Skin Cancer:  12/31/2019  12/24/2019   Adam Gilmore presented with the following signs/symptoms, ~12 months ago: area on forehead was scabbed over and wouldn't heal per patient  Past/Anticipated interventions by patient's surgeon/dermatologist for current problematic lesion, if any: 01/18/2020 Dr. Karin Golden Full thickness skin graft (FTSK) to right antihelix  12/31/2019 Dr. Karin Golden Mohs surgery (mid-frontal scalp)  12/24/2019 Dr. Karin Golden Mohs surgery (mid-frontal scalp and right antihelix)  Past skin cancers, if any:   History of Blistering sunburns, if any: Patient denies  SAFETY ISSUES:  Prior radiation? No  Pacemaker/ICD? No  Possible current pregnancy? N/A  Is the patient on methotrexate? No  Current Complaints / other details:  History of prostate cancer (surgical management)

## 2020-01-27 ENCOUNTER — Institutional Professional Consult (permissible substitution): Payer: Medicare PPO | Admitting: Radiation Oncology

## 2020-01-28 ENCOUNTER — Ambulatory Visit
Admission: RE | Admit: 2020-01-28 | Discharge: 2020-01-28 | Disposition: A | Discharge status: Home / Self Care | Payer: Medicare PPO | Source: Ambulatory Visit | Attending: Radiation Oncology | Admitting: Radiation Oncology

## 2020-01-28 ENCOUNTER — Encounter: Payer: Self-pay | Admitting: Radiation Oncology

## 2020-01-28 ENCOUNTER — Other Ambulatory Visit: Payer: Self-pay

## 2020-01-28 DIAGNOSIS — Z510 Encounter for antineoplastic radiation therapy: Secondary | ICD-10-CM | POA: Diagnosis not present

## 2020-01-28 DIAGNOSIS — C4441 Basal cell carcinoma of skin of scalp and neck: Secondary | ICD-10-CM

## 2020-01-28 HISTORY — DX: Unspecified malignant neoplasm of skin, unspecified: C44.90

## 2020-01-28 NOTE — Progress Notes (Signed)
Radiation Oncology         [phone] ________________________________  Initial Outpatient Consultation by telephone.  The patient opted for telemedicine to maximize safety during the pandemic.  MyChart video was not obtainable.   Name: Adam Gilmore MRN: [id number]  Date: 01/28/2020  DOB: [birth year]  CC:[name], MD  Karin Golden, MD   REFERRING PHYSICIAN: Karin Golden, MD  DIAGNOSIS:    ICD-10-CM   1. Basal cell carcinoma of scalp  C44.41   2. Basal cell carcinoma, scalp/neck  C44.41    Staging: pT3, Nx   CHIEF COMPLAINT: Here to discuss management of skin cancer  HISTORY OF PRESENT ILLNESS::Adam Gilmore is a 69 y.o. male who underwent a skin biopsy of the mid frontal scalp on 12/24/2019 that showed basal cell carcinoma, infiltrative type.  Subsequently, the patient underwent a Mohs surgery of the mid frontal scalp on 01/18/2020 under the care of Dr. Zenaida Niece. Results revealed infiltrating carcinoma with perineural invasion (caliber of nerve 0.15 mm).   Patient has also undergone skin surgery for cancer of the Right inferior antihelix with full thickness skin graft but Dr. Winifred Olive is not seeking an opinion regarding radiation to the site.  The patient is a retired Glass blower/designer.  He denies history of prior radiation therapy.  He plays golf in his free time.  PREVIOUS RADIATION THERAPY: No  PAST MEDICAL HISTORY:  has a past medical history of Hypercholesterolemia, prostate ca (dx'd 03/2012), and Skin cancer.    PAST SURGICAL HISTORY: Past Surgical History:  Procedure Laterality Date  . APPENDECTOMY     June 14, 2015  . COLONOSCOPY  1/14  . INSERTION OF MESH N/A 05/22/2016   Procedure: INSERTION OF MESH;  Surgeon: Clovis Riley, MD;  Location: [place];  Service: General;  Laterality: N/A;  . LYMPHADENECTOMY Bilateral 07/09/2012   Procedure: Noel Journey;  Surgeon: Dutch Gray, MD;  Location: [facility];  Service: Urology;  Laterality: Bilateral;   . ROBOT ASSISTED LAPAROSCOPIC RADICAL PROSTATECTOMY N/A 07/09/2012   Procedure: ROBOTIC ASSISTED LAPAROSCOPIC RADICAL PROSTATECTOMY LEVEL 2;  Surgeon: Dutch Gray, MD;  Location: [facility];  Service: Urology;  Laterality: N/A;  . TONSILLECTOMY    . VENTRAL HERNIA REPAIR N/A 05/22/2016   Procedure: LAPAROSCOPIC VENTRAL HERNIA REPAIR WITH MESH;  Surgeon: Clovis Riley, MD;  Location: [place];  Service: General;  Laterality: N/A;    FAMILY HISTORY: family history includes Cancer in his brother and father.  SOCIAL HISTORY:  reports that he has been smoking cigarettes. He has a 46.00 pack-year smoking history. He has never used smokeless tobacco. He reports current alcohol use. He reports that he does not use drugs.  ALLERGIES: No known allergies  MEDICATIONS:  Current Outpatient Medications  Medication Sig Dispense Refill  . acetaminophen (TYLENOL) 325 MG tablet Take 325 mg by mouth every 6 (six) hours as needed (for pain.).    Marland Kitchen atorvastatin (LIPITOR) 10 MG tablet Take 10 mg by mouth daily.    Marland Kitchen latanoprost (XALATAN) 0.005 % ophthalmic solution Place 1 drop into both eyes at bedtime.  5  . rivaroxaban (XARELTO) 20 MG TABS tablet Take 20 mg by mouth daily with supper.    . vitamin E (VITAMIN E) 180 MG (400 UNITS) capsule Take 400 Units by mouth daily.     No current facility-administered medications for this encounter.    REVIEW OF SYSTEMS:  Notable for that above.   PHYSICAL EXAM:  vitals were not taken for this visit.  General: Alert and oriented, in no acute distress   Photos from Dr. Winifred Olive:     LABORATORY DATA:  Lab Results  Component Value Date   WBC 6.9 05/20/2016   HGB 15.7 05/20/2016   HCT 46.4 05/20/2016   MCV 96.9 05/20/2016   PLT 197 05/20/2016   CMP     Component Value Date/Time   NA 139 05/20/2016 1530   K 4.2 05/20/2016 1530   CL 106 05/20/2016 1530   CO2 29 05/20/2016 1530   GLUCOSE 78 05/20/2016 1530   BUN 9 05/20/2016 1530   CREATININE 1.04 05/20/2016  1530   CALCIUM 9.2 05/20/2016 1530   PROT 6.3 (L) 05/20/2016 1530   ALBUMIN 3.8 05/20/2016 1530   AST 14 (L) 05/20/2016 1530   ALT 13 (L) 05/20/2016 1530   ALKPHOS 52 05/20/2016 1530   BILITOT 0.6 05/20/2016 1530   GFRNONAA >60 05/20/2016 1530   GFRAA >60 05/20/2016 1530     RADIOGRAPHY: No results found.    IMPRESSION/PLAN: Today, I talked to the patient about the findings and work-up thus far. We discussed the patient's diagnosis of skin cancer of the scalp with perineural invasion and general treatment for this, highlighting the role of adjuvant radiotherapy in the management. We discussed the available radiation techniques, and focused on the details of logistics and delivery.  I recommend electron radiation therapy given 5 days a week for 4 weeks to minimize risk of local recurrence.  We discussed the risks, benefits, and side effects of radiotherapy.  No guarantees of treatment were given; side effects may include but not necessarily be limited to: Skin irritation, fatigue, desquamation of the skin, permanent hair loss; no guarantees of treatment were given.  We discussed the techniques that will be used to minimize radiation exposure to his brain and the unlikely risk of permanent side effects to the brain. The patient was encouraged to ask questions that I answered to the best of my ability.   He would like to proceed with treatment.  We will bring him in for treatment planning in the near future, working around his plans for colonoscopy next week.   On date of service, in total, I spent 40 minutes on this encounter.  This encounter was provided by telemedicine platform by telephone.  The patient opted for telemedicine to maximize safety during the pandemic.  MyChart video was not obtainable. The patient has given verbal consent for this type of encounter and has been advised to only accept a meeting of this type in a secure network environment. The attendants for this meeting  include Eppie Gibson  and Felecia Shelling.  During the encounter, Eppie Gibson was located at [facility] Radiation Oncology Department.  Felecia Shelling was located at home.   __________________________________________   Eppie Gibson, MD  This document serves as a record of services personally performed by Eppie Gibson, MD. It was created on his behalf by Clerance Lav, a trained medical scribe. The creation of this record is based on the scribe's personal observations and the provider's statements to them. This document has been checked and approved by the attending provider.

## 2020-02-01 DIAGNOSIS — Z510 Encounter for antineoplastic radiation therapy: Secondary | ICD-10-CM | POA: Diagnosis not present

## 2020-02-03 ENCOUNTER — Ambulatory Visit: Payer: Medicare PPO | Admitting: Radiation Oncology

## 2020-02-03 ENCOUNTER — Ambulatory Visit: Payer: Medicare PPO

## 2020-02-03 DIAGNOSIS — Z510 Encounter for antineoplastic radiation therapy: Secondary | ICD-10-CM | POA: Diagnosis not present

## 2020-02-04 ENCOUNTER — Ambulatory Visit
Admission: RE | Admit: 2020-02-04 | Discharge: 2020-02-04 | Disposition: A | Discharge status: Home / Self Care | Payer: Medicare PPO | Source: Ambulatory Visit | Attending: Radiation Oncology | Admitting: Radiation Oncology

## 2020-02-04 DIAGNOSIS — Z510 Encounter for antineoplastic radiation therapy: Secondary | ICD-10-CM | POA: Diagnosis not present

## 2020-02-06 ENCOUNTER — Ambulatory Visit
Admission: RE | Admit: 2020-02-06 | Discharge: 2020-02-06 | Disposition: A | Discharge status: Home / Self Care | Payer: Medicare PPO | Source: Ambulatory Visit | Attending: Radiation Oncology | Admitting: Radiation Oncology

## 2020-02-06 ENCOUNTER — Other Ambulatory Visit: Payer: Self-pay

## 2020-02-06 DIAGNOSIS — Z510 Encounter for antineoplastic radiation therapy: Secondary | ICD-10-CM | POA: Diagnosis not present

## 2020-02-07 ENCOUNTER — Ambulatory Visit
Admission: RE | Admit: 2020-02-07 | Discharge: 2020-02-07 | Disposition: A | Discharge status: Home / Self Care | Payer: Medicare PPO | Source: Ambulatory Visit | Attending: Radiation Oncology | Admitting: Radiation Oncology

## 2020-02-07 ENCOUNTER — Other Ambulatory Visit: Payer: Self-pay

## 2020-02-07 DIAGNOSIS — C4441 Basal cell carcinoma of skin of scalp and neck: Secondary | ICD-10-CM

## 2020-02-07 DIAGNOSIS — Z510 Encounter for antineoplastic radiation therapy: Secondary | ICD-10-CM | POA: Diagnosis not present

## 2020-02-07 MED ORDER — SONAFINE EX EMUL
1.0000 "application " | Freq: Two times a day (BID) | CUTANEOUS | Status: DC
  Administered 2020-02-07: 1 via TOPICAL

## 2020-02-07 NOTE — Progress Notes (Signed)
Pt here for patient teaching.    Pt given Radiation and You booklet, skin care instructions and Sonafine.    Reviewed areas of pertinence such as fatigue, hair loss and skin changes .   Pt able to give teach back of to pat skin, use unscented/gentle soap and drink plenty of water,apply Sonafine bid and avoid applying anything to skin within 4 hours of treatment.   Pt demonstrated understanding and verbalizes understanding of information given and will contact nursing with any questions or concerns.    [email or web address]/treatmentinformation/whattoexpect/index

## 2020-02-08 ENCOUNTER — Ambulatory Visit
Admission: RE | Admit: 2020-02-08 | Discharge: 2020-02-08 | Disposition: A | Discharge status: Home / Self Care | Payer: Medicare PPO | Source: Ambulatory Visit | Attending: Radiation Oncology | Admitting: Radiation Oncology

## 2020-02-08 ENCOUNTER — Other Ambulatory Visit: Payer: Self-pay

## 2020-02-08 DIAGNOSIS — Z510 Encounter for antineoplastic radiation therapy: Secondary | ICD-10-CM | POA: Diagnosis not present

## 2020-02-09 ENCOUNTER — Ambulatory Visit
Admission: RE | Admit: 2020-02-09 | Discharge: 2020-02-09 | Disposition: A | Discharge status: Home / Self Care | Payer: Medicare PPO | Source: Ambulatory Visit | Attending: Radiation Oncology | Admitting: Radiation Oncology

## 2020-02-09 DIAGNOSIS — Z510 Encounter for antineoplastic radiation therapy: Secondary | ICD-10-CM | POA: Diagnosis not present

## 2020-02-14 ENCOUNTER — Ambulatory Visit
Admission: RE | Admit: 2020-02-14 | Discharge: 2020-02-14 | Disposition: A | Discharge status: Home / Self Care | Payer: Medicare PPO | Source: Ambulatory Visit | Attending: Radiation Oncology | Admitting: Radiation Oncology

## 2020-02-14 DIAGNOSIS — Z510 Encounter for antineoplastic radiation therapy: Secondary | ICD-10-CM | POA: Diagnosis not present

## 2020-02-15 ENCOUNTER — Ambulatory Visit
Admission: RE | Admit: 2020-02-15 | Discharge: 2020-02-15 | Disposition: A | Discharge status: Home / Self Care | Payer: Medicare PPO | Source: Ambulatory Visit | Attending: Radiation Oncology | Admitting: Radiation Oncology

## 2020-02-15 DIAGNOSIS — Z510 Encounter for antineoplastic radiation therapy: Secondary | ICD-10-CM | POA: Diagnosis not present

## 2020-02-16 ENCOUNTER — Ambulatory Visit
Admission: RE | Admit: 2020-02-16 | Discharge: 2020-02-16 | Disposition: A | Discharge status: Home / Self Care | Payer: Medicare PPO | Source: Ambulatory Visit | Attending: Radiation Oncology | Admitting: Radiation Oncology

## 2020-02-16 DIAGNOSIS — I82402 Acute embolism and thrombosis of unspecified deep veins of left lower extremity: Secondary | ICD-10-CM | POA: Diagnosis not present

## 2020-02-16 DIAGNOSIS — Z510 Encounter for antineoplastic radiation therapy: Secondary | ICD-10-CM | POA: Insufficient documentation

## 2020-02-16 DIAGNOSIS — C4441 Basal cell carcinoma of skin of scalp and neck: Secondary | ICD-10-CM | POA: Diagnosis not present

## 2020-02-17 ENCOUNTER — Ambulatory Visit
Admission: RE | Admit: 2020-02-17 | Discharge: 2020-02-17 | Disposition: A | Discharge status: Home / Self Care | Payer: Medicare PPO | Source: Ambulatory Visit | Attending: Radiation Oncology | Admitting: Radiation Oncology

## 2020-02-17 DIAGNOSIS — C4441 Basal cell carcinoma of skin of scalp and neck: Secondary | ICD-10-CM | POA: Diagnosis not present

## 2020-02-17 DIAGNOSIS — Z510 Encounter for antineoplastic radiation therapy: Secondary | ICD-10-CM | POA: Diagnosis not present

## 2020-02-17 DIAGNOSIS — I82402 Acute embolism and thrombosis of unspecified deep veins of left lower extremity: Secondary | ICD-10-CM | POA: Diagnosis not present

## 2020-02-18 ENCOUNTER — Ambulatory Visit
Admission: RE | Admit: 2020-02-18 | Discharge: 2020-02-18 | Disposition: A | Discharge status: Home / Self Care | Payer: Medicare PPO | Source: Ambulatory Visit | Attending: Radiation Oncology | Admitting: Radiation Oncology

## 2020-02-18 DIAGNOSIS — I82402 Acute embolism and thrombosis of unspecified deep veins of left lower extremity: Secondary | ICD-10-CM | POA: Diagnosis not present

## 2020-02-18 DIAGNOSIS — Z510 Encounter for antineoplastic radiation therapy: Secondary | ICD-10-CM | POA: Diagnosis not present

## 2020-02-18 DIAGNOSIS — C4441 Basal cell carcinoma of skin of scalp and neck: Secondary | ICD-10-CM | POA: Diagnosis not present

## 2020-02-21 ENCOUNTER — Ambulatory Visit
Admission: RE | Admit: 2020-02-21 | Discharge: 2020-02-21 | Disposition: A | Discharge status: Home / Self Care | Payer: Medicare PPO | Source: Ambulatory Visit | Attending: Radiation Oncology | Admitting: Radiation Oncology

## 2020-02-21 DIAGNOSIS — C4441 Basal cell carcinoma of skin of scalp and neck: Secondary | ICD-10-CM | POA: Diagnosis not present

## 2020-02-21 DIAGNOSIS — I82402 Acute embolism and thrombosis of unspecified deep veins of left lower extremity: Secondary | ICD-10-CM | POA: Diagnosis not present

## 2020-02-21 DIAGNOSIS — Z510 Encounter for antineoplastic radiation therapy: Secondary | ICD-10-CM | POA: Diagnosis not present

## 2020-02-22 ENCOUNTER — Ambulatory Visit
Admission: RE | Admit: 2020-02-22 | Discharge: 2020-02-22 | Disposition: A | Discharge status: Home / Self Care | Payer: Medicare PPO | Source: Ambulatory Visit | Attending: Radiation Oncology | Admitting: Radiation Oncology

## 2020-02-22 DIAGNOSIS — C4441 Basal cell carcinoma of skin of scalp and neck: Secondary | ICD-10-CM | POA: Diagnosis not present

## 2020-02-22 DIAGNOSIS — Z510 Encounter for antineoplastic radiation therapy: Secondary | ICD-10-CM | POA: Diagnosis not present

## 2020-02-22 DIAGNOSIS — I82402 Acute embolism and thrombosis of unspecified deep veins of left lower extremity: Secondary | ICD-10-CM | POA: Diagnosis not present

## 2020-02-23 ENCOUNTER — Other Ambulatory Visit: Payer: Self-pay

## 2020-02-23 ENCOUNTER — Ambulatory Visit
Admission: RE | Admit: 2020-02-23 | Discharge: 2020-02-23 | Disposition: A | Discharge status: Home / Self Care | Payer: Medicare PPO | Source: Ambulatory Visit | Attending: Radiation Oncology | Admitting: Radiation Oncology

## 2020-02-23 DIAGNOSIS — I82402 Acute embolism and thrombosis of unspecified deep veins of left lower extremity: Secondary | ICD-10-CM | POA: Diagnosis not present

## 2020-02-23 DIAGNOSIS — C4441 Basal cell carcinoma of skin of scalp and neck: Secondary | ICD-10-CM | POA: Diagnosis not present

## 2020-02-23 DIAGNOSIS — Z510 Encounter for antineoplastic radiation therapy: Secondary | ICD-10-CM | POA: Diagnosis not present

## 2020-02-24 ENCOUNTER — Ambulatory Visit
Admission: RE | Admit: 2020-02-24 | Discharge: 2020-02-24 | Disposition: A | Discharge status: Home / Self Care | Payer: Medicare PPO | Source: Ambulatory Visit | Attending: Radiation Oncology | Admitting: Radiation Oncology

## 2020-02-24 DIAGNOSIS — Z510 Encounter for antineoplastic radiation therapy: Secondary | ICD-10-CM | POA: Diagnosis not present

## 2020-02-24 DIAGNOSIS — C4441 Basal cell carcinoma of skin of scalp and neck: Secondary | ICD-10-CM | POA: Diagnosis not present

## 2020-02-24 DIAGNOSIS — I82402 Acute embolism and thrombosis of unspecified deep veins of left lower extremity: Secondary | ICD-10-CM | POA: Diagnosis not present

## 2020-02-25 ENCOUNTER — Ambulatory Visit
Admission: RE | Admit: 2020-02-25 | Discharge: 2020-02-25 | Disposition: A | Discharge status: Home / Self Care | Payer: Medicare PPO | Source: Ambulatory Visit | Attending: Radiation Oncology | Admitting: Radiation Oncology

## 2020-02-25 DIAGNOSIS — C4441 Basal cell carcinoma of skin of scalp and neck: Secondary | ICD-10-CM | POA: Diagnosis not present

## 2020-02-25 DIAGNOSIS — I82402 Acute embolism and thrombosis of unspecified deep veins of left lower extremity: Secondary | ICD-10-CM | POA: Diagnosis not present

## 2020-02-25 DIAGNOSIS — Z510 Encounter for antineoplastic radiation therapy: Secondary | ICD-10-CM | POA: Diagnosis not present

## 2020-02-28 ENCOUNTER — Ambulatory Visit
Admission: RE | Admit: 2020-02-28 | Discharge: 2020-02-28 | Disposition: A | Discharge status: Home / Self Care | Payer: Medicare PPO | Source: Ambulatory Visit | Attending: Radiation Oncology | Admitting: Radiation Oncology

## 2020-02-28 DIAGNOSIS — Z510 Encounter for antineoplastic radiation therapy: Secondary | ICD-10-CM | POA: Diagnosis not present

## 2020-02-28 DIAGNOSIS — C4441 Basal cell carcinoma of skin of scalp and neck: Secondary | ICD-10-CM | POA: Diagnosis not present

## 2020-02-28 DIAGNOSIS — I82402 Acute embolism and thrombosis of unspecified deep veins of left lower extremity: Secondary | ICD-10-CM

## 2020-02-28 MED ORDER — SONAFINE EX EMUL
1.0000 "application " | Freq: Two times a day (BID) | CUTANEOUS | Status: DC
  Administered 2020-02-28: 1 via TOPICAL

## 2020-02-29 ENCOUNTER — Ambulatory Visit
Admission: RE | Admit: 2020-02-29 | Discharge: 2020-02-29 | Disposition: A | Discharge status: Home / Self Care | Payer: Medicare PPO | Source: Ambulatory Visit | Attending: Radiation Oncology | Admitting: Radiation Oncology

## 2020-02-29 DIAGNOSIS — C4441 Basal cell carcinoma of skin of scalp and neck: Secondary | ICD-10-CM | POA: Diagnosis not present

## 2020-02-29 DIAGNOSIS — Z510 Encounter for antineoplastic radiation therapy: Secondary | ICD-10-CM | POA: Diagnosis not present

## 2020-02-29 DIAGNOSIS — I82402 Acute embolism and thrombosis of unspecified deep veins of left lower extremity: Secondary | ICD-10-CM | POA: Diagnosis not present

## 2020-03-01 ENCOUNTER — Ambulatory Visit
Admission: RE | Admit: 2020-03-01 | Discharge: 2020-03-01 | Disposition: A | Discharge status: Home / Self Care | Payer: Medicare PPO | Source: Ambulatory Visit | Attending: Radiation Oncology | Admitting: Radiation Oncology

## 2020-03-01 DIAGNOSIS — Z510 Encounter for antineoplastic radiation therapy: Secondary | ICD-10-CM | POA: Diagnosis not present

## 2020-03-01 DIAGNOSIS — I82402 Acute embolism and thrombosis of unspecified deep veins of left lower extremity: Secondary | ICD-10-CM | POA: Diagnosis not present

## 2020-03-01 DIAGNOSIS — C4441 Basal cell carcinoma of skin of scalp and neck: Secondary | ICD-10-CM | POA: Diagnosis not present

## 2020-03-02 ENCOUNTER — Other Ambulatory Visit: Payer: Self-pay

## 2020-03-02 ENCOUNTER — Ambulatory Visit
Admission: RE | Admit: 2020-03-02 | Discharge: 2020-03-02 | Disposition: A | Discharge status: Home / Self Care | Payer: Medicare PPO | Source: Ambulatory Visit | Attending: Radiation Oncology | Admitting: Radiation Oncology

## 2020-03-02 ENCOUNTER — Encounter: Payer: Self-pay | Admitting: Radiation Oncology

## 2020-03-02 DIAGNOSIS — Z510 Encounter for antineoplastic radiation therapy: Secondary | ICD-10-CM | POA: Diagnosis not present

## 2020-03-02 DIAGNOSIS — I82402 Acute embolism and thrombosis of unspecified deep veins of left lower extremity: Secondary | ICD-10-CM | POA: Diagnosis not present

## 2020-03-02 DIAGNOSIS — C4441 Basal cell carcinoma of skin of scalp and neck: Secondary | ICD-10-CM | POA: Diagnosis not present

## 2020-03-03 ENCOUNTER — Ambulatory Visit: Payer: Medicare PPO

## 2020-03-09 NOTE — Progress Notes (Signed)
  Patient Name: Adam Gilmore MRN: [id number] DOB: 1950/11/17 Referring Physician: Karin Golden Date of Service: 03/02/2020 [facility], [place]                                                        End Of Treatment Note  Diagnoses: C44.41-Basal cell carcinoma of skin of scalp and neck  Cancer Staging:  pT3, Nx   Intent: Curative  Radiation Treatment Dates: 02/03/2020 through 03/02/2020 Site Technique Total Dose (Gy) Dose per Fx (Gy) Completed Fx Beam Energies  Scalp: HN_scalp 3D 50/50 2.5 20/20 6E   Narrative: The patient tolerated radiation therapy relatively well.   Plan: The patient will follow-up with radiation oncology in 1 mo or as needed.  -----------------------------------  Eppie Gibson, MD

## 2020-04-10 ENCOUNTER — Telehealth: Payer: Self-pay

## 2020-04-10 NOTE — Telephone Encounter (Signed)
I called the patient today about their upcoming follow-up appointment in radiation oncology.   Given the state of the  COVID-19 pandemic, concerning case numbers in our community, and guidance from [facility], I offered a phone assessment with the patient to determine if coming to the clinic was necessary. The patient accepted.  I let the patient know that I had spoken with Dr. Isidore Moos, and she wanted them to know the importance of washing their hands for at least 20 seconds at a time, especially after going out in public, and before they eat. Limit going out in public whenever possible. Do not touch your face, unless your hands are clean, such as when bathing. Get plenty of rest, eat well, and stay hydrated. Patient verbalized understanding and agreement  Symptomatically, the patient is doing relatively well. They report skin is intact, and that almost all the redness has resolved. He reports mild residual swelling to area, but states it's much improved since he's finished radiation. He denies any headaches, nausea, or dizziness. He states he has a follow-up with his dermatologist sometime in February.   All questions were answered to the patient's satisfaction.  I encouraged the patient to call with any further questions. Otherwise, the plan is maintain regular check-ups with his dermatologist, and reach out to radiation department as needed.    Patient is pleased with this plan, and we will cancel their upcoming follow-up to reduce the risk of COVID-19 transmission.

## 2020-04-11 ENCOUNTER — Ambulatory Visit: Payer: Medicare PPO | Admitting: Radiation Oncology

## 2020-04-25 DIAGNOSIS — L570 Actinic keratosis: Secondary | ICD-10-CM | POA: Diagnosis not present

## 2020-04-25 DIAGNOSIS — Z85828 Personal history of other malignant neoplasm of skin: Secondary | ICD-10-CM | POA: Diagnosis not present

## 2020-04-25 DIAGNOSIS — L111 Transient acantholytic dermatosis [Grover]: Secondary | ICD-10-CM | POA: Diagnosis not present

## 2020-04-25 DIAGNOSIS — L905 Scar conditions and fibrosis of skin: Secondary | ICD-10-CM | POA: Diagnosis not present

## 2020-04-25 DIAGNOSIS — L728 Other follicular cysts of the skin and subcutaneous tissue: Secondary | ICD-10-CM | POA: Diagnosis not present

## 2020-04-25 DIAGNOSIS — B078 Other viral warts: Secondary | ICD-10-CM | POA: Diagnosis not present

## 2020-05-17 DIAGNOSIS — L570 Actinic keratosis: Secondary | ICD-10-CM | POA: Diagnosis not present

## 2020-05-31 DIAGNOSIS — N5201 Erectile dysfunction due to arterial insufficiency: Secondary | ICD-10-CM | POA: Diagnosis not present

## 2020-05-31 DIAGNOSIS — Z8546 Personal history of malignant neoplasm of prostate: Secondary | ICD-10-CM | POA: Diagnosis not present

## 2020-06-21 DIAGNOSIS — L570 Actinic keratosis: Secondary | ICD-10-CM | POA: Diagnosis not present

## 2020-07-11 DIAGNOSIS — H2513 Age-related nuclear cataract, bilateral: Secondary | ICD-10-CM | POA: Diagnosis not present

## 2020-07-11 DIAGNOSIS — H401131 Primary open-angle glaucoma, bilateral, mild stage: Secondary | ICD-10-CM | POA: Diagnosis not present

## 2020-08-17 DIAGNOSIS — L570 Actinic keratosis: Secondary | ICD-10-CM | POA: Diagnosis not present

## 2020-10-23 DIAGNOSIS — L905 Scar conditions and fibrosis of skin: Secondary | ICD-10-CM | POA: Diagnosis not present

## 2020-10-23 DIAGNOSIS — C44729 Squamous cell carcinoma of skin of left lower limb, including hip: Secondary | ICD-10-CM | POA: Diagnosis not present

## 2020-10-23 DIAGNOSIS — D1801 Hemangioma of skin and subcutaneous tissue: Secondary | ICD-10-CM | POA: Diagnosis not present

## 2020-10-23 DIAGNOSIS — L821 Other seborrheic keratosis: Secondary | ICD-10-CM | POA: Diagnosis not present

## 2020-10-23 DIAGNOSIS — L570 Actinic keratosis: Secondary | ICD-10-CM | POA: Diagnosis not present

## 2020-10-23 DIAGNOSIS — R202 Paresthesia of skin: Secondary | ICD-10-CM | POA: Diagnosis not present

## 2020-10-23 DIAGNOSIS — Z85828 Personal history of other malignant neoplasm of skin: Secondary | ICD-10-CM | POA: Diagnosis not present

## 2020-10-23 DIAGNOSIS — D485 Neoplasm of uncertain behavior of skin: Secondary | ICD-10-CM | POA: Diagnosis not present

## 2020-10-23 DIAGNOSIS — L738 Other specified follicular disorders: Secondary | ICD-10-CM | POA: Diagnosis not present

## 2020-11-01 DIAGNOSIS — C44729 Squamous cell carcinoma of skin of left lower limb, including hip: Secondary | ICD-10-CM | POA: Diagnosis not present

## 2021-01-03 DIAGNOSIS — D692 Other nonthrombocytopenic purpura: Secondary | ICD-10-CM | POA: Diagnosis not present

## 2021-01-03 DIAGNOSIS — D126 Benign neoplasm of colon, unspecified: Secondary | ICD-10-CM | POA: Diagnosis not present

## 2021-01-03 DIAGNOSIS — Z86718 Personal history of other venous thrombosis and embolism: Secondary | ICD-10-CM | POA: Diagnosis not present

## 2021-01-03 DIAGNOSIS — Z1389 Encounter for screening for other disorder: Secondary | ICD-10-CM | POA: Diagnosis not present

## 2021-01-03 DIAGNOSIS — F17200 Nicotine dependence, unspecified, uncomplicated: Secondary | ICD-10-CM | POA: Diagnosis not present

## 2021-01-03 DIAGNOSIS — E7800 Pure hypercholesterolemia, unspecified: Secondary | ICD-10-CM | POA: Diagnosis not present

## 2021-01-03 DIAGNOSIS — Z8546 Personal history of malignant neoplasm of prostate: Secondary | ICD-10-CM | POA: Diagnosis not present

## 2021-01-03 DIAGNOSIS — Z0000 Encounter for general adult medical examination without abnormal findings: Secondary | ICD-10-CM | POA: Diagnosis not present

## 2021-01-03 DIAGNOSIS — Z79899 Other long term (current) drug therapy: Secondary | ICD-10-CM | POA: Diagnosis not present

## 2021-01-22 DIAGNOSIS — H524 Presbyopia: Secondary | ICD-10-CM | POA: Diagnosis not present

## 2021-01-22 DIAGNOSIS — H2513 Age-related nuclear cataract, bilateral: Secondary | ICD-10-CM | POA: Diagnosis not present

## 2021-01-22 DIAGNOSIS — H401132 Primary open-angle glaucoma, bilateral, moderate stage: Secondary | ICD-10-CM | POA: Diagnosis not present

## 2021-01-22 DIAGNOSIS — H52223 Regular astigmatism, bilateral: Secondary | ICD-10-CM | POA: Diagnosis not present

## 2021-02-05 ENCOUNTER — Other Ambulatory Visit: Payer: Self-pay | Admitting: *Deleted

## 2021-02-05 DIAGNOSIS — F17210 Nicotine dependence, cigarettes, uncomplicated: Secondary | ICD-10-CM

## 2021-02-05 DIAGNOSIS — Z87891 Personal history of nicotine dependence: Secondary | ICD-10-CM

## 2021-03-02 ENCOUNTER — Other Ambulatory Visit: Payer: Self-pay

## 2021-03-02 ENCOUNTER — Ambulatory Visit (INDEPENDENT_AMBULATORY_CARE_PROVIDER_SITE_OTHER): Payer: Medicare PPO | Admitting: Acute Care

## 2021-03-02 ENCOUNTER — Encounter: Payer: Self-pay | Admitting: Acute Care

## 2021-03-02 DIAGNOSIS — F17210 Nicotine dependence, cigarettes, uncomplicated: Secondary | ICD-10-CM

## 2021-03-02 NOTE — Patient Instructions (Signed)
Thank you for participating in the [place] [facility] Scre[facility]. °It was our pleasure to meet you today. °We will call you with the results of your scan within the next few days. °Your scan will be assigned a Lung RADS category score by the physicians reading the scans.  °This Lung RADS score determines follow up scanning.  °See below for description of categories, and follow up screening recommendations. °We will be in touch to schedule your follow up screening annually or based on recommendations of our providers. °We will fax a copy of your scan results to your Primary Care Physician, or the physician who referred you to the program, to ensure they have the results. °Please call the office if you have any questions or concerns regarding your scanning experience or results.  °Our office number is [phone]. °Please speak with Denise Phelps, RN. She is our Lung Cancer Screening RN. °If she is unavailable when you call, please have the office staff send her a message. She will return your call at her earliest convenience. °Remember, if your scan is normal, we will scan you annually as long as you continue to meet the criteria for the program. (Age 55-77, Current smoker or smoker who has quit within the last 15 years). °If you are a smoker, remember, quitting is the single most powerful action that you can take to decrease your risk of lung cancer and other pulmonary, breathing related problems. °We know quitting is hard, and we are here to help.  °Please let us know if there is anything we can do to help you meet your goal of quitting. °If you are a former smoker, congratulations. We are proud of you! Remain smoke free! °Remember you can refer friends or family members through the number above.  °We will screen them to make sure they meet criteria for the program. °Thank you for helping us take better care of you by participating in Lung Screening. ° °You can receive free nicotine replacement therapy  ( patches, gum or mints) by calling 1-800-QUIT NOW. Please call so we can get you on the path to becoming  a non-smoker. I know it is hard, but you can do this! ° °Lung RADS Categories: ° °Lung RADS 1: no nodules or definitely non-concerning nodules.  °Recommendation is for a repeat annual scan in 12 months. ° °Lung RADS 2:  nodules that are non-concerning in appearance and behavior with a very low likelihood of becoming an active cancer. °Recommendation is for a repeat annual scan in 12 months. ° °Lung RADS 3: nodules that are probably non-concerning , includes nodules with a low likelihood of becoming an active cancer.  Recommendation is for a 6-month repeat screening scan. Often noted after an upper respiratory illness. We will be in touch to make sure you have no questions, and to schedule your 6-month scan. ° °Lung RADS 4 A: nodules with concerning findings, recommendation is most often for a follow up scan in 3 months or additional testing based on our provider's assessment of the scan. We will be in touch to make sure you have no questions and to schedule the recommended 3 month follow up scan. ° °Lung RADS 4 B:  indicates findings that are concerning. We will be in touch with you to schedule additional diagnostic testing based on our provider's  assessment of the scan. ° °Hypnosis for smoking cessation  °[employer] °[phone] ° °Acupuncture for smoking cessation  °[facility] °[phone]  °

## 2021-03-02 NOTE — Progress Notes (Signed)
Virtual Visit via Telephone Note  I connected with Adam Gilmore on 01/30/21 at  2:00 PM EST by telephone and verified that I am speaking with the correct person using two identifiers.  Location: Patient: Home Provider: Working from home   I discussed the limitations, risks, security and privacy concerns of performing an evaluation and management service by telephone and the availability of in person appointments. I also discussed with the patient that there may be a patient responsible charge related to this service. The patient expressed understanding and agreed to proceed.  Shared Decision Making Visit Lung Cancer Screening Program [phone])   Eligibility: Age 70 y.o. Pack Years Smoking History Calculation 56 (# packs/per year x # years smoked) Recent History of coughing up blood  no Unexplained weight loss? no ( >Than 15 pounds within the last 6 months ) Prior History Lung / other cancer no (Diagnosis within the last 5 years already requiring surveillance chest CT Scans). Smoking Status Current Smoker Former Smokers: Years since quit: NA  Quit Date: NA  Visit Components: Discussion included one or more decision making aids. yes Discussion included risk/benefits of screening. yes Discussion included potential follow up diagnostic testing for abnormal scans. yes Discussion included meaning and risk of over diagnosis. yes Discussion included meaning and risk of False Positives. yes Discussion included meaning of total radiation exposure. yes  Counseling Included: Importance of adherence to annual lung cancer LDCT screening. yes Impact of comorbidities on ability to participate in the program. yes Ability and willingness to under diagnostic treatment. yes  Smoking Cessation Counseling: Current Smokers:  Discussed importance of smoking cessation. yes Information about tobacco cessation classes and interventions provided to patient. yes Patient provided with "ticket" for  LDCT Scan. yes Symptomatic Patient. yes  Counseling(Intermediate counseling: > three minutes) [address]406 Diagnosis Code: Tobacco Use Z72.0 Asymptomatic Patient NA  Counseling NA Former Smokers:  Discussed the importance of maintaining cigarette abstinence. yes Diagnosis Code: Personal History of Nicotine Dependence. O53.664 Information about tobacco cessation classes and interventions provided to patient. Yes Patient provided with "ticket" for LDCT Scan. yes Written Order for Lung Cancer Screening with LDCT placed in Epic. Yes (CT Chest Lung Cancer Screening Low Dose W/O CM) [id number] Z12.2-Screening of respiratory organs Z87.891-Personal history of nicotine dependence   I spent 25 minutes of face to face time with him discussing the risks and benefits of lung cancer screening. We viewed a power point together that explained in detail the above noted topics. We took the time to pause the power point at intervals to allow for questions to be asked and answered to ensure understanding. We discussed that he had taken the single most powerful action possible to decrease his risk of developing lung cancer when he quit smoking. I counseled him to remain smoke free, and to contact me if he ever had the desire to smoke again so that I can provide resources and tools to help support the effort to remain smoke free. We discussed the time and location of the scan, and that either  Doroteo Glassman RN or I will call with the results within  24-48 hours of receiving them. He has my card and contact information in the event He needs to speak with me, in addition to a copy of the power point we reviewed as a resource. He verbalized understanding of all of the above and had no further questions upon leaving the office.     I explained to the patient that there has been a  high incidence of coronary artery disease noted on these exams. I explained that this is a non-gated exam therefore degree or severity cannot be  determined. This patient is not on statin therapy. I have asked the patient to follow-up with their PCP regarding any incidental finding of coronary artery disease and management with diet or medication as they feel is clinically indicated. The patient verbalized understanding of the above and had no further questions.  Kayln Garceau D. Kenton Kingfisher, NP-C [facility] Personal contact information can be found on Amion  03/02/2021, 3:11 PM

## 2021-03-05 ENCOUNTER — Ambulatory Visit
Admission: RE | Admit: 2021-03-05 | Discharge: 2021-03-05 | Disposition: A | Discharge status: Home / Self Care | Payer: Medicare PPO | Source: Ambulatory Visit | Attending: Acute Care | Admitting: Acute Care

## 2021-03-05 ENCOUNTER — Other Ambulatory Visit: Payer: Self-pay

## 2021-03-05 DIAGNOSIS — F17210 Nicotine dependence, cigarettes, uncomplicated: Secondary | ICD-10-CM

## 2021-03-05 DIAGNOSIS — Z87891 Personal history of nicotine dependence: Secondary | ICD-10-CM

## 2021-03-07 ENCOUNTER — Other Ambulatory Visit: Payer: Self-pay | Admitting: Acute Care

## 2021-03-07 DIAGNOSIS — F17210 Nicotine dependence, cigarettes, uncomplicated: Secondary | ICD-10-CM

## 2021-03-07 DIAGNOSIS — Z87891 Personal history of nicotine dependence: Secondary | ICD-10-CM

## 2021-04-02 ENCOUNTER — Encounter (HOSPITAL_BASED_OUTPATIENT_CLINIC_OR_DEPARTMENT_OTHER): Payer: Self-pay | Admitting: Urology

## 2021-04-02 ENCOUNTER — Inpatient Hospital Stay (HOSPITAL_BASED_OUTPATIENT_CLINIC_OR_DEPARTMENT_OTHER)
Admission: EM | Admit: 2021-04-02 | Discharge: 2021-04-07 | DRG: 177 | Disposition: A | Discharge status: Home / Self Care | Payer: Medicare PPO | Attending: Internal Medicine | Admitting: Internal Medicine

## 2021-04-02 ENCOUNTER — Other Ambulatory Visit: Payer: Self-pay

## 2021-04-02 ENCOUNTER — Emergency Department (HOSPITAL_BASED_OUTPATIENT_CLINIC_OR_DEPARTMENT_OTHER): Payer: Medicare PPO

## 2021-04-02 DIAGNOSIS — Z85828 Personal history of other malignant neoplasm of skin: Secondary | ICD-10-CM | POA: Diagnosis not present

## 2021-04-02 DIAGNOSIS — Z8546 Personal history of malignant neoplasm of prostate: Secondary | ICD-10-CM

## 2021-04-02 DIAGNOSIS — E785 Hyperlipidemia, unspecified: Secondary | ICD-10-CM | POA: Diagnosis not present

## 2021-04-02 DIAGNOSIS — Z86718 Personal history of other venous thrombosis and embolism: Secondary | ICD-10-CM | POA: Diagnosis not present

## 2021-04-02 DIAGNOSIS — R9431 Abnormal electrocardiogram [ECG] [EKG]: Secondary | ICD-10-CM | POA: Diagnosis not present

## 2021-04-02 DIAGNOSIS — R0902 Hypoxemia: Secondary | ICD-10-CM

## 2021-04-02 DIAGNOSIS — U071 COVID-19: Principal | ICD-10-CM | POA: Diagnosis present

## 2021-04-02 DIAGNOSIS — Z7901 Long term (current) use of anticoagulants: Secondary | ICD-10-CM

## 2021-04-02 DIAGNOSIS — E559 Vitamin D deficiency, unspecified: Secondary | ICD-10-CM | POA: Diagnosis present

## 2021-04-02 DIAGNOSIS — E7800 Pure hypercholesterolemia, unspecified: Secondary | ICD-10-CM | POA: Diagnosis present

## 2021-04-02 DIAGNOSIS — R0602 Shortness of breath: Secondary | ICD-10-CM | POA: Diagnosis not present

## 2021-04-02 DIAGNOSIS — E041 Nontoxic single thyroid nodule: Secondary | ICD-10-CM | POA: Diagnosis not present

## 2021-04-02 DIAGNOSIS — Z8042 Family history of malignant neoplasm of prostate: Secondary | ICD-10-CM | POA: Diagnosis not present

## 2021-04-02 DIAGNOSIS — J9601 Acute respiratory failure with hypoxia: Secondary | ICD-10-CM | POA: Diagnosis present

## 2021-04-02 DIAGNOSIS — I82402 Acute embolism and thrombosis of unspecified deep veins of left lower extremity: Secondary | ICD-10-CM | POA: Diagnosis not present

## 2021-04-02 DIAGNOSIS — J439 Emphysema, unspecified: Secondary | ICD-10-CM | POA: Diagnosis not present

## 2021-04-02 DIAGNOSIS — F17210 Nicotine dependence, cigarettes, uncomplicated: Secondary | ICD-10-CM | POA: Diagnosis present

## 2021-04-02 HISTORY — DX: Chronic obstructive pulmonary disease, unspecified: J44.9

## 2021-04-02 LAB — LACTIC ACID, PLASMA: Lactic Acid, Venous: 1.2 mmol/L (ref 0.5–1.9)

## 2021-04-02 LAB — CBC WITH DIFFERENTIAL/PLATELET
Abs Immature Granulocytes: 0 10*3/uL (ref 0.00–0.07)
Basophils Absolute: 0 10*3/uL (ref 0.0–0.1)
Basophils Relative: 1 %
Eosinophils Absolute: 0 10*3/uL (ref 0.0–0.5)
Eosinophils Relative: 0 %
HCT: 45.3 % (ref 39.0–52.0)
Hemoglobin: 15 g/dL (ref 13.0–17.0)
Immature Granulocytes: 0 %
Lymphocytes Relative: 19 %
Lymphs Abs: 0.9 10*3/uL (ref 0.7–4.0)
MCH: 33 pg (ref 26.0–34.0)
MCHC: 33.1 g/dL (ref 30.0–36.0)
MCV: 99.6 fL (ref 80.0–100.0)
Monocytes Absolute: 0.7 10*3/uL (ref 0.1–1.0)
Monocytes Relative: 15 %
Neutro Abs: 3 10*3/uL (ref 1.7–7.7)
Neutrophils Relative %: 65 %
Platelets: 174 10*3/uL (ref 150–400)
RBC: 4.55 MIL/uL (ref 4.22–5.81)
RDW: 12.5 % (ref 11.5–15.5)
WBC: 4.6 10*3/uL (ref 4.0–10.5)
nRBC: 0 % (ref 0.0–0.2)

## 2021-04-02 LAB — COMPREHENSIVE METABOLIC PANEL
ALT: 12 U/L (ref 0–44)
AST: 15 U/L (ref 15–41)
Albumin: 4 g/dL (ref 3.5–5.0)
Alkaline Phosphatase: 50 U/L (ref 38–126)
Anion gap: 7 (ref 5–15)
BUN: 12 mg/dL (ref 8–23)
CO2: 33 mmol/L — ABNORMAL HIGH (ref 22–32)
Calcium: 8.7 mg/dL — ABNORMAL LOW (ref 8.9–10.3)
Chloride: 99 mmol/L (ref 98–111)
Creatinine, Ser: 0.89 mg/dL (ref 0.61–1.24)
GFR, Estimated: >60 mL/min (ref >60)
Glucose, Bld: 106 mg/dL — ABNORMAL HIGH (ref 70–99)
Potassium: 4 mmol/L (ref 3.5–5.1)
Sodium: 139 mmol/L (ref 135–145)
Total Bilirubin: 0.4 mg/dL (ref 0.3–1.2)
Total Protein: 6.6 g/dL (ref 6.5–8.1)

## 2021-04-02 LAB — RESP PANEL BY RT-PCR (FLU A&B, COVID) ARPGX2
Influenza A by PCR: NEGATIVE
Influenza B by PCR: NEGATIVE
SARS Coronavirus 2 by RT PCR: POSITIVE — AB

## 2021-04-02 LAB — PROCALCITONIN: Procalcitonin: <0.1 ng/mL

## 2021-04-02 LAB — FERRITIN: Ferritin: 66 ng/mL (ref 24–336)

## 2021-04-02 LAB — D-DIMER, QUANTITATIVE: D-Dimer, Quant: <0.27 ug/mL-FEU (ref 0.00–0.50)

## 2021-04-02 LAB — LACTATE DEHYDROGENASE: LDH: 155 U/L (ref 98–192)

## 2021-04-02 LAB — TRIGLYCERIDES: Triglycerides: 78 mg/dL (ref <150)

## 2021-04-02 LAB — FIBRINOGEN: Fibrinogen: 526 mg/dL — ABNORMAL HIGH (ref 210–475)

## 2021-04-02 LAB — BRAIN NATRIURETIC PEPTIDE: B Natriuretic Peptide: 31.3 pg/mL (ref 0.0–100.0)

## 2021-04-02 LAB — C-REACTIVE PROTEIN: CRP: 4 mg/dL — ABNORMAL HIGH (ref <1.0)

## 2021-04-02 MED ORDER — DEXAMETHASONE SODIUM PHOSPHATE 10 MG/ML IJ SOLN
6.0000 mg | Freq: Once | INTRAMUSCULAR | Status: AC
  Administered 2021-04-02: 6 mg via INTRAVENOUS
  Filled 2021-04-02: qty 1

## 2021-04-02 MED ORDER — IOHEXOL 350 MG/ML SOLN
100.0000 mL | Freq: Once | INTRAVENOUS | Status: AC | PRN
  Administered 2021-04-02: 100 mL via INTRAVENOUS

## 2021-04-02 NOTE — ED Notes (Signed)
RT Note: Pt. arrived on room air to Triage 1 with Oxygen saturations of 83% in moderate distress, RR~20-24, placed on 4 lpm n/c initially increasing to 98%, b/l b.s. decreased, moved to Airborne Isolation room 4 on portable Oxygen and placed to 2 lpm n/c with saturations leveling @98 %.

## 2021-04-02 NOTE — ED Provider Notes (Signed)
[facility] EMERGENCY DEPT Provider Note   CSN: [id number] Arrival date & time: 04/02/21  1541     History  Chief Complaint  Patient presents with   Shortness of Breath   COVID-19 Positive    Adam Gilmore is a 71 y.o. male.  Patient presents to ER chief complaint of cough congestion shortness of breath.  Symptoms have been ongoing for 2 days.  He took a COVID test today found that is COVID-positive presents to the ER.  He is otherwise vaccinated, has a history of COPD but is not oxygen dependent.  Denies any headache or chest pain or abdominal pain.      Home Medications Prior to Admission medications   Medication Sig Start Date End Date Taking? Authorizing Provider  acetaminophen (TYLENOL) 325 MG tablet Take 325 mg by mouth every 6 (six) hours as needed (for pain.).    [provider]  atorvastatin (LIPITOR) 10 MG tablet Take 10 mg by mouth daily. 11/12/19   [provider]  latanoprost (XALATAN) 0.005 % ophthalmic solution Place 1 drop into both eyes at bedtime. 03/05/16   [provider]  rivaroxaban (XARELTO) 20 MG TABS tablet Take 20 mg by mouth daily with supper.    [provider]  vitamin E (VITAMIN E) 180 MG (400 UNITS) capsule Take 400 Units by mouth daily.    [provider]      Allergies    No known allergies    Review of Systems   Review of Systems  Constitutional:  Negative for fever.  HENT:  Negative for ear pain and sore throat.   Eyes:  Negative for pain.  Respiratory:  Positive for cough and shortness of breath.   Cardiovascular:  Negative for chest pain.  Gastrointestinal:  Negative for abdominal pain.  Genitourinary:  Negative for flank pain.  Musculoskeletal:  Negative for back pain.  Skin:  Negative for color change and rash.  Neurological:  Negative for syncope.  All other systems reviewed and are negative.  Physical Exam Updated Vital Signs BP 131/67    Pulse 66    Temp 98.2 F  (36.8 C)    Resp (!) 22    Ht 6' (1.829 m)    Wt 86.7 kg    SpO2 100%    BMI 25.92 kg/m  Physical Exam Constitutional:      Appearance: He is well-developed.  HENT:     Head: Normocephalic.     Nose: Nose normal.  Eyes:     Extraocular Movements: Extraocular movements intact.  Cardiovascular:     Rate and Rhythm: Normal rate.  Pulmonary:     Effort: Pulmonary effort is normal.     Breath sounds: Normal breath sounds. No wheezing.  Skin:    Coloration: Skin is not jaundiced.  Neurological:     Mental Status: He is alert. Mental status is at baseline.    ED Results / Procedures / Treatments   Labs (all labs ordered are listed, but only abnormal results are displayed) Labs Reviewed  RESP PANEL BY RT-PCR (FLU A&B, COVID) ARPGX2 - Abnormal; Notable for the following components:      Result Value   SARS Coronavirus 2 by RT PCR POSITIVE (*)    All other components within normal limits  COMPREHENSIVE METABOLIC PANEL - Abnormal; Notable for the following components:   CO2 33 (*)    Glucose, Bld 106 (*)    Calcium 8.7 (*)    All other components within  normal limits  CULTURE, BLOOD (ROUTINE X 2)  CULTURE, BLOOD (ROUTINE X 2)  LACTIC ACID, PLASMA  CBC WITH DIFFERENTIAL/PLATELET  D-DIMER, QUANTITATIVE  LACTATE DEHYDROGENASE  BRAIN NATRIURETIC PEPTIDE  PROCALCITONIN  FERRITIN  TRIGLYCERIDES  FIBRINOGEN  C-REACTIVE PROTEIN    EKG None  Radiology DG Chest Portable 1 View  Result Date: 04/02/2021 CLINICAL DATA:  Shortness of breath.  COVID positive. EXAM: PORTABLE CHEST 1 VIEW COMPARISON:  06/30/2012 FINDINGS: Lungs are hyperexpanded. Right upper lobe pulmonary nodule compatible with scarring seen on lung cancer screening CT 03/05/2021. No focal airspace consolidation. No pleural effusion. No evidence for pneumothorax. The cardiopericardial silhouette is within normal limits for size. Bones are diffusely demineralized. IMPRESSION: Hyperexpansion without acute cardiopulmonary  findings. Electronically Signed   By: Misty Stanley M.D.   On: 04/02/2021 16:20    Procedures .Critical Care Performed by: Luna Fuse, MD Authorized by: Luna Fuse, MD   Critical care provider statement:    Critical care time (minutes):  40   Critical care time was exclusive of:  Separately billable procedures and treating other patients and teaching time   Critical care was necessary to treat or prevent imminent or life-threatening deterioration of the following conditions:  Respiratory failure Comments:     Hypoxemia    Medications Ordered in ED Medications  dexamethasone (DECADRON) injection 6 mg (6 mg Intravenous Given 04/02/21 1630)    ED Course/ Medical Decision Making/ A&P                           Medical Decision Making 71 year old patient with history of COPD now COVID-positive with symptoms for 2 days.  He is on Xarelto.  Additional history obtained from the wife is at bedside.  Amount and/or Complexity of Data Reviewed Independent Historian: spouse Labs: ordered. Decision-making details documented in ED Course. Radiology: ordered. Decision-making details documented in ED Course. ECG/medicine tests: ordered and independent interpretation performed. Decision-making details documented in ED Course.  Risk Decision regarding hospitalization. Risk Details: Patient is hypoxic here in the ER requiring 2 to 3 L nasal cannula support. Give IV decadron for COVID+ with hypoxemia.  Case discussed with hospitalist who requested a CT angio which was ordered.  Admitted to the hospitalist team.           Final Clinical Impression(s) / ED Diagnoses Final diagnoses:  COVID-19 virus infection  Hypoxemia    Rx / DC Orders ED Discharge Orders     None         Luna Fuse, MD 04/02/21 1755

## 2021-04-02 NOTE — Treatment Plan (Signed)
71 yo with hx HLD, prostate cancer, COPD not on home O2, and skin cancer who presents with SOB and was found to have COVID 19 virus infection.  Labs unremarkable.  CXR with hyperexpansion.  He was hypoxic to 83% on RA on presentation and is now requiring 2 L by [place].  No wheezing.  Requested CT chest with contrast given CXR without acute abnormalities.  Will admit to obs at [facility] for covid 19 treatment.  He received dexamethasone in the ED.

## 2021-04-02 NOTE — ED Notes (Signed)
Patient transported to CT 

## 2021-04-02 NOTE — ED Triage Notes (Addendum)
CC: Shortness of breath and low SPO2 x 2 days Pt states went on cruise last week SOB started 2 days ago, took Covid test this morning was Positive Spo2 86% on RA on arrival, resp at bedside Put on O@  at 4L

## 2021-04-03 ENCOUNTER — Encounter (HOSPITAL_COMMUNITY): Payer: Self-pay | Admitting: Family Medicine

## 2021-04-03 DIAGNOSIS — J9601 Acute respiratory failure with hypoxia: Secondary | ICD-10-CM | POA: Diagnosis not present

## 2021-04-03 DIAGNOSIS — U071 COVID-19: Secondary | ICD-10-CM | POA: Diagnosis not present

## 2021-04-03 DIAGNOSIS — Z86718 Personal history of other venous thrombosis and embolism: Secondary | ICD-10-CM | POA: Diagnosis not present

## 2021-04-03 DIAGNOSIS — Z7901 Long term (current) use of anticoagulants: Secondary | ICD-10-CM | POA: Diagnosis not present

## 2021-04-03 DIAGNOSIS — E7800 Pure hypercholesterolemia, unspecified: Secondary | ICD-10-CM | POA: Diagnosis not present

## 2021-04-03 DIAGNOSIS — Z8042 Family history of malignant neoplasm of prostate: Secondary | ICD-10-CM | POA: Diagnosis not present

## 2021-04-03 DIAGNOSIS — F17210 Nicotine dependence, cigarettes, uncomplicated: Secondary | ICD-10-CM | POA: Diagnosis not present

## 2021-04-03 DIAGNOSIS — E559 Vitamin D deficiency, unspecified: Secondary | ICD-10-CM | POA: Diagnosis not present

## 2021-04-03 DIAGNOSIS — E785 Hyperlipidemia, unspecified: Secondary | ICD-10-CM | POA: Diagnosis present

## 2021-04-03 DIAGNOSIS — Z8546 Personal history of malignant neoplasm of prostate: Secondary | ICD-10-CM | POA: Diagnosis not present

## 2021-04-03 DIAGNOSIS — Z85828 Personal history of other malignant neoplasm of skin: Secondary | ICD-10-CM | POA: Diagnosis not present

## 2021-04-03 DIAGNOSIS — R0902 Hypoxemia: Secondary | ICD-10-CM | POA: Diagnosis present

## 2021-04-03 LAB — COMPREHENSIVE METABOLIC PANEL
ALT: 14 U/L (ref 0–44)
AST: 16 U/L (ref 15–41)
Albumin: 3.3 g/dL — ABNORMAL LOW (ref 3.5–5.0)
Alkaline Phosphatase: 46 U/L (ref 38–126)
Anion gap: 5 (ref 5–15)
BUN: 11 mg/dL (ref 8–23)
CO2: 36 mmol/L — ABNORMAL HIGH (ref 22–32)
Calcium: 8.4 mg/dL — ABNORMAL LOW (ref 8.9–10.3)
Chloride: 99 mmol/L (ref 98–111)
Creatinine, Ser: 0.84 mg/dL (ref 0.61–1.24)
GFR, Estimated: >60 mL/min (ref >60)
Glucose, Bld: 106 mg/dL — ABNORMAL HIGH (ref 70–99)
Potassium: 3.7 mmol/L (ref 3.5–5.1)
Sodium: 140 mmol/L (ref 135–145)
Total Bilirubin: 0.4 mg/dL (ref 0.3–1.2)
Total Protein: 6 g/dL — ABNORMAL LOW (ref 6.5–8.1)

## 2021-04-03 LAB — CBC WITH DIFFERENTIAL/PLATELET
Abs Immature Granulocytes: 0.01 10*3/uL (ref 0.00–0.07)
Basophils Absolute: 0 10*3/uL (ref 0.0–0.1)
Basophils Relative: 0 %
Eosinophils Absolute: 0 10*3/uL (ref 0.0–0.5)
Eosinophils Relative: 0 %
HCT: 43 % (ref 39.0–52.0)
Hemoglobin: 14.2 g/dL (ref 13.0–17.0)
Immature Granulocytes: 0 %
Lymphocytes Relative: 25 %
Lymphs Abs: 1.3 10*3/uL (ref 0.7–4.0)
MCH: 33 pg (ref 26.0–34.0)
MCHC: 33 g/dL (ref 30.0–36.0)
MCV: 100 fL (ref 80.0–100.0)
Monocytes Absolute: 0.7 10*3/uL (ref 0.1–1.0)
Monocytes Relative: 13 %
Neutro Abs: 3.1 10*3/uL (ref 1.7–7.7)
Neutrophils Relative %: 62 %
Platelets: 167 10*3/uL (ref 150–400)
RBC: 4.3 MIL/uL (ref 4.22–5.81)
RDW: 12.3 % (ref 11.5–15.5)
WBC: 5 10*3/uL (ref 4.0–10.5)
nRBC: 0 % (ref 0.0–0.2)

## 2021-04-03 LAB — D-DIMER, QUANTITATIVE: D-Dimer, Quant: 0.36 ug/mL-FEU (ref 0.00–0.50)

## 2021-04-03 LAB — FERRITIN: Ferritin: 40 ng/mL (ref 24–336)

## 2021-04-03 LAB — C-REACTIVE PROTEIN: CRP: 3.4 mg/dL — ABNORMAL HIGH (ref <1.0)

## 2021-04-03 MED ORDER — SODIUM CHLORIDE 0.9 % IV SOLN
100.0000 mg | INTRAVENOUS | Status: AC
  Administered 2021-04-03 (×2): 100 mg via INTRAVENOUS

## 2021-04-03 MED ORDER — ONDANSETRON HCL 4 MG PO TABS: 4.0000 mg | ORAL_TABLET | Freq: Four times a day (QID) | ORAL | Status: DC | PRN

## 2021-04-03 MED ORDER — ONDANSETRON HCL 4 MG/2ML IJ SOLN: 4.0000 mg | Freq: Four times a day (QID) | INTRAMUSCULAR | Status: DC | PRN

## 2021-04-03 MED ORDER — ZINC SULFATE 220 (50 ZN) MG PO CAPS
220.0000 mg | ORAL_CAPSULE | Freq: Every day | ORAL | Status: DC
  Administered 2021-04-04 – 2021-04-07 (×4): 220 mg via ORAL
  Filled 2021-04-03 (×4): qty 1

## 2021-04-03 MED ORDER — DEXAMETHASONE SODIUM PHOSPHATE 10 MG/ML IJ SOLN
6.0000 mg | INTRAMUSCULAR | Status: DC
  Administered 2021-04-03 – 2021-04-06 (×4): 6 mg via INTRAVENOUS
  Filled 2021-04-03 (×4): qty 1

## 2021-04-03 MED ORDER — NIACIN 500 MG PO TABS
500.0000 mg | ORAL_TABLET | Freq: Every day | ORAL | Status: DC
  Administered 2021-04-03 – 2021-04-06 (×4): 500 mg via ORAL
  Filled 2021-04-03 (×4): qty 1

## 2021-04-03 MED ORDER — ACETAMINOPHEN 325 MG PO TABS: 650.0000 mg | ORAL_TABLET | Freq: Four times a day (QID) | ORAL | Status: DC | PRN

## 2021-04-03 MED ORDER — ASCORBIC ACID 500 MG PO TABS
500.0000 mg | ORAL_TABLET | Freq: Every day | ORAL | Status: DC
  Administered 2021-04-03 – 2021-04-06 (×4): 500 mg via ORAL
  Filled 2021-04-03 (×4): qty 1

## 2021-04-03 MED ORDER — VITAMIN E 45 MG (100 UNIT) PO CAPS
400.0000 [IU] | ORAL_CAPSULE | Freq: Every day | ORAL | Status: DC
  Administered 2021-04-03 – 2021-04-06 (×4): 400 [IU] via ORAL
  Filled 2021-04-03 (×4): qty 4

## 2021-04-03 MED ORDER — ATORVASTATIN CALCIUM 10 MG PO TABS
10.0000 mg | ORAL_TABLET | Freq: Every day | ORAL | Status: DC
  Administered 2021-04-03 – 2021-04-06 (×4): 10 mg via ORAL
  Filled 2021-04-03 (×4): qty 1

## 2021-04-03 MED ORDER — RIVAROXABAN 20 MG PO TABS
20.0000 mg | ORAL_TABLET | Freq: Every day | ORAL | Status: DC
  Administered 2021-04-03 – 2021-04-06 (×4): 20 mg via ORAL
  Filled 2021-04-03 (×4): qty 1

## 2021-04-03 MED ORDER — SODIUM CHLORIDE 0.9 % IV SOLN
100.0000 mg | Freq: Every day | INTRAVENOUS | Status: AC
  Administered 2021-04-04 – 2021-04-07 (×4): 100 mg via INTRAVENOUS
  Filled 2021-04-03 (×4): qty 20

## 2021-04-03 MED ORDER — IPRATROPIUM-ALBUTEROL 20-100 MCG/ACT IN AERS: 2.0000 | INHALATION_SPRAY | Freq: Four times a day (QID) | RESPIRATORY_TRACT | Status: DC | PRN

## 2021-04-03 MED ORDER — ACETAMINOPHEN 650 MG RE SUPP: 650.0000 mg | Freq: Four times a day (QID) | RECTAL | Status: DC | PRN

## 2021-04-03 MED ORDER — LATANOPROST 0.005 % OP SOLN
1.0000 [drp] | Freq: Every day | OPHTHALMIC | Status: DC
  Administered 2021-04-03 – 2021-04-06 (×4): 1 [drp] via OPHTHALMIC
  Filled 2021-04-03: qty 2.5

## 2021-04-03 NOTE — ED Notes (Signed)
Care Handoff/Report given to [facility] at this Time. [facility] at the Bedside. No Questions at this Time.

## 2021-04-03 NOTE — ED Notes (Signed)
Patient made aware of Admission Status. Patient verbally consents to Transfer.

## 2021-04-03 NOTE — ED Notes (Signed)
Care Handoff/Patient Report given to Raquel Sarna, RN at this Time. All Questions answered.

## 2021-04-03 NOTE — ED Notes (Signed)
NAD Noted at this Time. Patient comfortable. Snacks and Coffee provided as requested. Call [place] within Reach. VSS. Staff will continue to Monitor.

## 2021-04-03 NOTE — H&P (Signed)
History and Physical    [name] RWE:[id number] DOB: 07-02-1950 DOA: 04/02/2021  PCP: [name], MD  Patient coming from: Home.  I have personally briefly reviewed patient's old medical records in [place]  Chief Complaint: Shortness of breath for 2 days.  HPI: Adam Gilmore is a 71 y.o. male with medical history significant of COPD, hyperlipidemia, prostate cancer, history of skin cancer,, history of LLE DVT, overweight who is coming to the emergency department with complaints of progressively worse dyspnea for the past 2 days after developing URI symptoms following a cruise last week and then becoming hypoxic Saturday evening.  He denied chest pain, palpitations, diaphoresis, PND, orthopnea or pitting edema of the lower extremities.  His LLE is usually is slightly bigger after he had the DVT.  No abdominal pain, nausea, emesis, diarrhea, constipation, melena or hematochezia.  No flank pain, dysuria, frequency or hematuria.  ED Course: Initial vital signs were temperature Initial vital signs were temperature 98.2 F, pulse 83, respiration 18, BP 166/75 mmHg and O2 sat 83% on room air.  He is currently 97% on 2 LPM via nasal cannula.  He received dexamethasone 6 mg IVP and remdesivir in the emergency department.  Lab work: His CBC, D-dimer, procalcitonin and lactic acid were normal.  CRP was 4.0 and fibrinogen was 526 mg/dL.  CMP showed normal LFTs, renal function and electrolytes, except for CO2 that was 33 mmol/L.  Glucose 106 and calcium 8.7 mg/dL.  Imaging: No acute findings.  Stable thyroid and adrenal nodules.  Please see images and full radiology report for further details.  Review of Systems: As per HPI otherwise all other systems reviewed and are negative.  Past Medical History:  Diagnosis Date   COPD (chronic obstructive pulmonary disease) ([place])    Hypercholesterolemia    prostate ca dx'd 03/2012   surg only   Skin cancer     Past Surgical History:   Procedure Laterality Date   APPENDECTOMY     June 14, 2015   COLONOSCOPY  1/14   INSERTION OF MESH N/A 05/22/2016   Procedure: INSERTION OF MESH;  Surgeon: Clovis Riley, MD;  Location: [facility];  Service: General;  Laterality: N/A;   LYMPHADENECTOMY Bilateral 07/09/2012   Procedure: Noel Journey;  Surgeon: Dutch Gray, MD;  Location: [facility];  Service: Urology;  Laterality: Bilateral;   ROBOT ASSISTED LAPAROSCOPIC RADICAL PROSTATECTOMY N/A 07/09/2012   Procedure: ROBOTIC ASSISTED LAPAROSCOPIC RADICAL PROSTATECTOMY LEVEL 2;  Surgeon: Dutch Gray, MD;  Location: [facility];  Service: Urology;  Laterality: N/A;   TONSILLECTOMY     VENTRAL HERNIA REPAIR N/A 05/22/2016   Procedure: LAPAROSCOPIC VENTRAL HERNIA REPAIR WITH MESH;  Surgeon: Clovis Riley, MD;  Location: [facility];  Service: General;  Laterality: N/A;    Social History  reports that he has been smoking cigarettes. He has a 56.00 pack-year smoking history. He has never used smokeless tobacco. He reports current alcohol use. He reports that he does not use drugs.  Allergies  Allergen Reactions   No Known Allergies     Family History  Problem Relation Age of Onset   Cancer Father        prostate cancer   Cancer Brother        prostate cancer    Prior to Admission medications   Medication Sig Start Date End Date Taking? Authorizing Provider  acetaminophen (TYLENOL) 325 MG tablet Take 325 mg by mouth every 6 (six) hours as needed (for pain.).  Yes [provider]  atorvastatin (LIPITOR) 10 MG tablet Take 10 mg by mouth daily. 11/12/19  Yes [provider]  latanoprost (XALATAN) 0.005 % ophthalmic solution Place 1 drop into both eyes at bedtime. 03/05/16  Yes [provider]  niacin 500 MG tablet Take 500 mg by mouth at bedtime.   Yes [provider]  rivaroxaban (XARELTO) 20 MG TABS tablet Take 20 mg by mouth daily with supper.   Yes [provider]  vitamin C (ASCORBIC ACID) 500 MG tablet  Take 500 mg by mouth daily.   Yes [provider]  vitamin E 180 MG (400 UNITS) capsule Take 400 Units by mouth daily.   Yes [provider]    Physical Exam: Vitals:   04/03/21 1100 04/03/21 1130 04/03/21 1145 04/03/21 1248  BP: (!) 146/69 (!) 149/85 126/72 124/71  Pulse: 76 68 65 67  Resp: (!) 23 16  18   Temp:  98 F (36.7 C)  97.8 F (36.6 C)  TempSrc:  Oral  Oral  SpO2: 91% 98% 97% 97%  Weight:      Height:        Constitutional: NAD, calm, comfortable Eyes: PERRL, lids and conjunctivae normal.  Bilateral conjunctival injection. ENMT: Mucous membranes are moist. Posterior pharynx clear of any exudate or lesions. Neck: normal, supple, no masses, no thyromegaly Respiratory: clear to auscultation bilaterally, no wheezing, no crackles. Normal respiratory effort. No accessory muscle use.  Cardiovascular: Regular rate and rhythm, no murmurs / rubs / gallops. No extremity edema. 2+ pedal pulses. No carotid bruits.  Abdomen: No distention.  Soft, no tenderness, no masses palpated. No hepatosplenomegaly. Bowel sounds positive.  Musculoskeletal: no clubbing / cyanosis.  Mild generalized weakness.  Good ROM, no contractures. Normal muscle tone.  Skin: no acute rashes, lesions, ulcers on very limited dermatological examination. Neurologic: CN 2-12 grossly intact. Sensation intact, DTR normal. Strength 5/5 in all 4.  Psychiatric: Normal judgment and insight. Alert and oriented x 3. Normal mood.   Labs on Admission: I have personally reviewed following labs and imaging studies  CBC: Recent Labs  Lab 04/02/21 1626  WBC 4.6  NEUTROABS 3.0  HGB 15.0  HCT 45.3  MCV 99.6  PLT 580    Basic Metabolic Panel: Recent Labs  Lab 04/02/21 1626  NA 139  K 4.0  CL 99  CO2 33*  GLUCOSE 106*  BUN 12  CREATININE 0.89  CALCIUM 8.7*    GFR: Estimated Creatinine Clearance: 83.6 mL/min (by C-G formula based on SCr of 0.89 mg/dL).  Liver Function Tests: Recent Labs   Lab 04/02/21 1626  AST 15  ALT 12  ALKPHOS 50  BILITOT 0.4  PROT 6.6  ALBUMIN 4.0    Urine analysis: No results found for: COLORURINE, APPEARANCEUR, LABSPEC, PHURINE, GLUCOSEU, HGBUR, BILIRUBINUR, KETONESUR, PROTEINUR, UROBILINOGEN, NITRITE, LEUKOCYTESUR  Radiological Exams on Admission: CT Angio Chest PE W and/or Wo Contrast  Result Date: 04/02/2021 CLINICAL DATA:  Pulmonary embolism suspected. Shortness of breath and low oxygen saturations for 2 days. Positive COVID test. EXAM: CT ANGIOGRAPHY CHEST WITH CONTRAST TECHNIQUE: Multidetector CT imaging of the chest was performed using the standard protocol during bolus administration of intravenous contrast. Multiplanar CT image reconstructions and MIPs were obtained to evaluate the vascular anatomy. RADIATION DOSE REDUCTION: This exam was performed according to the departmental dose-optimization program which includes automated exposure control, adjustment of the mA and/or kV according to patient size and/or use of iterative reconstruction technique. CONTRAST:  12mL OMNIPAQUE IOHEXOL  350 MG/ML SOLN COMPARISON:  03/05/2021 and 07/02/2012 FINDINGS: Cardiovascular: Heart is normal in size. Coronary artery calcifications are noted. The ascending aorta is partially calcified but not aneurysmal. Normal arch anatomy. Pulmonary arteries are well opacified by contrast bolus and there is no evidence for acute pulmonary embolus. Mediastinum/Nodes: 2.4 centimeter LEFT thyroid nodule, similar to technically adequate exam showing no acute pulmonary study and stable compared to exam 07/02/2012. esophagus is unremarkable. No significant mediastinal, hilar, or axillary adenopathy. Lungs/Pleura: Significant and diffuse panlobular emphysema with bullous changes in the apices. There is architectural distortion within the RIGHT UPPER lobe. No suspicious mass or nodule. No consolidations or pleural effusions. Upper Abdomen: Homogeneous low-attenuation LEFT adrenal mass  is stable in appearance, 3.6 centimeters in diameter. There is atherosclerotic calcification of the abdominal aorta. Musculoskeletal: No chest wall abnormality. No acute or significant osseous findings. Review of the MIP images confirms the above findings. IMPRESSION: 1. Technically adequate exam showing no acute pulmonary embolus. 2.  Emphysema (ICD10-J43.9). 3.  Aortic Atherosclerosis (ICD10-I70.0). 4. Stable LEFT thyroid nodule. Not clinically significant; no follow-up imaging recommended (ref: J Am Coll Radiol. 2015 Feb;12(2): 143-50). 5. Benign, stable LEFT adrenal nodule.  No follow-up imaging needed. Electronically Signed   By: Nolon Nations M.D.   On: 04/02/2021 18:22   DG Chest Portable 1 View  Result Date: 04/02/2021 CLINICAL DATA:  Shortness of breath.  COVID positive. EXAM: PORTABLE CHEST 1 VIEW COMPARISON:  06/30/2012 FINDINGS: Lungs are hyperexpanded. Right upper lobe pulmonary nodule compatible with scarring seen on lung cancer screening CT 03/05/2021. No focal airspace consolidation. No pleural effusion. No evidence for pneumothorax. The cardiopericardial silhouette is within normal limits for size. Bones are diffusely demineralized. IMPRESSION: Hyperexpansion without acute cardiopulmonary findings. Electronically Signed   By: Misty Stanley M.D.   On: 04/02/2021 16:20    EKG: Independently reviewed.  Vent. rate 71 BPM PR interval 193 ms QRS duration 100 ms QT/QTcB 408/444 ms P-R-T axes 80 33 77 Sinus rhythm Nonspecific T abnrm, anterolateral leads  Assessment/Plan Principal Problem:   Acute respiratory failure with hypoxia In the setting of   COVID-19 virus infection Observation/MedSurg. Continue supplemental oxygen. Bronchodilators as needed. Incentive spirometry and flutter valve use. Continue dexamethasone 6 mg daily. Continue remdesivir per pharmacy. Follow-up CBC, CMP and inflammatory markers.  Active Problems:   Recurrent acute deep vein thrombosis (DVT) of LLE   Continue Xarelto.    Hyperlipidemia Continue atorvastatin and niacin.    Hypocalcemia Recheck calcium and albumin level. Further work-up depending on results.    DVT prophylaxis: On Xarelto. Code Status:   Full code. Family Communication:   Disposition Plan:   Patient is from:  Home.  Anticipated DC to:  Home.  Anticipated DC date:  04/06/2021.  Anticipated DC barriers: Clinical status.  Consults called:   Admission status:  Observation/telemetry.  Severity of Illness: High severity in the setting of COVID-19 infection with acute respiratory failure and new oxygen requirement.  Reubin Milan MD [facility]  How to contact the [facility] Attending or Consulting provider [place] or covering provider during after hours [place], for this patient?   Check the care team in [facility] and look for a) attending/consulting [facility] provider listed and b) the [facility] team listed Log into [email or web address] and use [place]'s universal password to access. If you do not have the password, please contact the hospital operator. Locate the [facility] provider you are looking for under [facility] and page to a number that you can  be directly reached. If you still have difficulty reaching the provider, please page the [facility] (Director on Call) for the Hospitalists listed on amion for assistance.  04/03/2021, 1:33 PM   This document was prepared using Par[employer] and may contain some unintended transcription errors.

## 2021-04-04 DIAGNOSIS — J9601 Acute respiratory failure with hypoxia: Secondary | ICD-10-CM | POA: Diagnosis present

## 2021-04-04 DIAGNOSIS — E785 Hyperlipidemia, unspecified: Secondary | ICD-10-CM

## 2021-04-04 DIAGNOSIS — I82402 Acute embolism and thrombosis of unspecified deep veins of left lower extremity: Secondary | ICD-10-CM

## 2021-04-04 DIAGNOSIS — Z85828 Personal history of other malignant neoplasm of skin: Secondary | ICD-10-CM | POA: Diagnosis not present

## 2021-04-04 DIAGNOSIS — R0902 Hypoxemia: Secondary | ICD-10-CM | POA: Diagnosis present

## 2021-04-04 DIAGNOSIS — E7800 Pure hypercholesterolemia, unspecified: Secondary | ICD-10-CM | POA: Diagnosis present

## 2021-04-04 DIAGNOSIS — Z8042 Family history of malignant neoplasm of prostate: Secondary | ICD-10-CM | POA: Diagnosis not present

## 2021-04-04 DIAGNOSIS — Z7901 Long term (current) use of anticoagulants: Secondary | ICD-10-CM | POA: Diagnosis not present

## 2021-04-04 DIAGNOSIS — F17210 Nicotine dependence, cigarettes, uncomplicated: Secondary | ICD-10-CM | POA: Diagnosis present

## 2021-04-04 DIAGNOSIS — Z8546 Personal history of malignant neoplasm of prostate: Secondary | ICD-10-CM | POA: Diagnosis not present

## 2021-04-04 DIAGNOSIS — Z86718 Personal history of other venous thrombosis and embolism: Secondary | ICD-10-CM | POA: Diagnosis not present

## 2021-04-04 DIAGNOSIS — E559 Vitamin D deficiency, unspecified: Secondary | ICD-10-CM | POA: Diagnosis present

## 2021-04-04 DIAGNOSIS — U071 COVID-19: Secondary | ICD-10-CM | POA: Diagnosis present

## 2021-04-04 LAB — CBC WITH DIFFERENTIAL/PLATELET
Abs Immature Granulocytes: 0.02 10*3/uL (ref 0.00–0.07)
Basophils Absolute: 0 10*3/uL (ref 0.0–0.1)
Basophils Relative: 0 %
Eosinophils Absolute: 0 10*3/uL (ref 0.0–0.5)
Eosinophils Relative: 0 %
HCT: 46.2 % (ref 39.0–52.0)
Hemoglobin: 15.4 g/dL (ref 13.0–17.0)
Immature Granulocytes: 0 %
Lymphocytes Relative: 21 %
Lymphs Abs: 1.2 10*3/uL (ref 0.7–4.0)
MCH: 33 pg (ref 26.0–34.0)
MCHC: 33.3 g/dL (ref 30.0–36.0)
MCV: 99.1 fL (ref 80.0–100.0)
Monocytes Absolute: 0.5 10*3/uL (ref 0.1–1.0)
Monocytes Relative: 9 %
Neutro Abs: 3.9 10*3/uL (ref 1.7–7.7)
Neutrophils Relative %: 70 %
Platelets: 180 10*3/uL (ref 150–400)
RBC: 4.66 MIL/uL (ref 4.22–5.81)
RDW: 12 % (ref 11.5–15.5)
WBC: 5.6 10*3/uL (ref 4.0–10.5)
nRBC: 0 % (ref 0.0–0.2)

## 2021-04-04 LAB — COMPREHENSIVE METABOLIC PANEL
ALT: 16 U/L (ref 0–44)
AST: 15 U/L (ref 15–41)
Albumin: 3.8 g/dL (ref 3.5–5.0)
Alkaline Phosphatase: 48 U/L (ref 38–126)
Anion gap: 8 (ref 5–15)
BUN: 12 mg/dL (ref 8–23)
CO2: 30 mmol/L (ref 22–32)
Calcium: 8.7 mg/dL — ABNORMAL LOW (ref 8.9–10.3)
Chloride: 100 mmol/L (ref 98–111)
Creatinine, Ser: 0.8 mg/dL (ref 0.61–1.24)
GFR, Estimated: >60 mL/min (ref >60)
Glucose, Bld: 119 mg/dL — ABNORMAL HIGH (ref 70–99)
Potassium: 3.8 mmol/L (ref 3.5–5.1)
Sodium: 138 mmol/L (ref 135–145)
Total Bilirubin: 0.7 mg/dL (ref 0.3–1.2)
Total Protein: 6.5 g/dL (ref 6.5–8.1)

## 2021-04-04 LAB — MAGNESIUM: Magnesium: 2.3 mg/dL (ref 1.7–2.4)

## 2021-04-04 LAB — FERRITIN: Ferritin: 35 ng/mL (ref 24–336)

## 2021-04-04 LAB — C-REACTIVE PROTEIN: CRP: 2.5 mg/dL — ABNORMAL HIGH (ref <1.0)

## 2021-04-04 LAB — D-DIMER, QUANTITATIVE: D-Dimer, Quant: 0.27 ug/mL-FEU (ref 0.00–0.50)

## 2021-04-04 LAB — PHOSPHORUS: Phosphorus: 4.2 mg/dL (ref 2.5–4.6)

## 2021-04-04 MED ORDER — IPRATROPIUM-ALBUTEROL 0.5-2.5 (3) MG/3ML IN SOLN
3.0000 mL | Freq: Three times a day (TID) | RESPIRATORY_TRACT | Status: DC
  Administered 2021-04-05 (×2): 3 mL via RESPIRATORY_TRACT
  Filled 2021-04-04 (×3): qty 3

## 2021-04-04 MED ORDER — DIPHENHYDRAMINE HCL 25 MG PO CAPS
25.0000 mg | ORAL_CAPSULE | Freq: Four times a day (QID) | ORAL | Status: DC | PRN
  Administered 2021-04-05 – 2021-04-06 (×2): 25 mg via ORAL
  Filled 2021-04-04 (×4): qty 1

## 2021-04-04 MED ORDER — IPRATROPIUM-ALBUTEROL 0.5-2.5 (3) MG/3ML IN SOLN: 3.0000 mL | RESPIRATORY_TRACT | Status: DC | PRN

## 2021-04-04 MED ORDER — PANTOPRAZOLE SODIUM 40 MG PO TBEC
40.0000 mg | DELAYED_RELEASE_TABLET | Freq: Every day | ORAL | Status: DC
  Administered 2021-04-04 – 2021-04-07 (×4): 40 mg via ORAL
  Filled 2021-04-04 (×4): qty 1

## 2021-04-04 MED ORDER — DIPHENHYDRAMINE HCL 25 MG PO CAPS
25.0000 mg | ORAL_CAPSULE | Freq: Once | ORAL | Status: AC
  Administered 2021-04-04: 25 mg via ORAL
  Filled 2021-04-04: qty 1

## 2021-04-04 MED ORDER — IPRATROPIUM-ALBUTEROL 0.5-2.5 (3) MG/3ML IN SOLN
3.0000 mL | Freq: Four times a day (QID) | RESPIRATORY_TRACT | Status: DC
  Administered 2021-04-04 (×3): 3 mL via RESPIRATORY_TRACT
  Filled 2021-04-04 (×3): qty 3

## 2021-04-04 MED ORDER — LORATADINE 10 MG PO TABS
10.0000 mg | ORAL_TABLET | Freq: Every day | ORAL | Status: DC
  Administered 2021-04-04 – 2021-04-07 (×4): 10 mg via ORAL
  Filled 2021-04-04 (×4): qty 1

## 2021-04-04 MED ORDER — FLUTICASONE PROPIONATE 50 MCG/ACT NA SUSP
2.0000 | Freq: Every day | NASAL | Status: DC
  Administered 2021-04-04 – 2021-04-07 (×4): 2 via NASAL
  Filled 2021-04-04: qty 16

## 2021-04-04 NOTE — Progress Notes (Signed)
PROGRESS NOTE    Adam Gilmore  HYW:[id number] DOB: [birth year] DOA: 04/02/2021 PCP: [name], MD    Chief Complaint  Patient presents with   Shortness of Breath   COVID-19 Positive    Brief Narrative:  Patient 71 year old gentleman history of COPD with ongoing tobacco use, hyperlipidemia, prostate cancer, history of skin cancer, history of left lower extremity DVT presented to the ED with progressive worsening dyspnea x2 days after developing upper respiratory symptoms following a cruise 1 week prior to admission became hypoxic and presented to the ED.  COVID-19 PCR positive.  Patient placed on IV dexamethasone and IV remdesivir.   Assessment & Plan:   Principal Problem:   COVID-19 virus infection Active Problems:   Recurrent acute deep vein thrombosis (DVT) of left lower extremity (HCC)   Hyperlipidemia   Hypocalcemia  #1 acute respiratory failure with hypoxia secondary to COVID-19 virus infection -Patient admitted with hypoxia, progressive worsening dyspnea, COVID-19 PCR positive. -CT angiogram chest negative for PE. -Continue IV dexamethasone, IV remdesivir per pharmacy. -Follow inflammatory markers. -Continue vitamin C, zinc. -Placed on Claritin, Flonase, scheduled duo nebs, PPI. -Supportive care.  2.  Recurrent acute left lower extremity DVT -Xarelto.  3.  Hyperlipidemia -Continue statin, niacin.  4.  Tobacco abuse -Tobacco cessation. -At this time patient not interested in nicotine patch.  5.  Hypocalcemia -Calcium at 8.7. -Albumin at 3.8. -Check a vitamin D 25-hydroxy level. -Outpatient follow-up.    DVT prophylaxis: Xarelto Code Status: Full Family Communication: Updated patient and wife at bedside. Disposition:   Status is: Inpatient  The patient will require care spanning > 2 midnights and should be moved to inpatient because: Severity of illness      Consultants:  None  Procedures:  CT angiogram chest 04/02/2021 Chest x-ray  04/02/2021   Antimicrobials:  IV remdesivir 04/03/2021>>>> 04/08/2021   Subjective: Sitting up in bed.  States no significant change since admission, may be minor improvement with shortness of breath.  No chest pain.  No abdominal pain.  Wife at bedside.  Sats of 96% on 2 L nasal cannula.  Objective: Vitals:   04/03/21 2014 04/04/21 0009 04/04/21 0326 04/04/21 0955  BP: 109/74 124/60 112/67   Pulse: 66 66 70   Resp: (!) 21 20 18    Temp: 97.9 F (36.6 C) 97.9 F (36.6 C) 97.7 F (36.5 C)   TempSrc: Oral Oral Oral   SpO2: 94% 94% 96% 96%  Weight:      Height:        Intake/Output Summary (Last 24 hours) at 04/04/2021 1215 Last data filed at 04/03/2021 2300 Gross per 24 hour  Intake 240 ml  Output --  Net 240 ml   Filed Weights   04/02/21 1558  Weight: 86.7 kg    Examination:  General exam: Appears calm and comfortable  Respiratory system: Expiratory wheezing noted.  Fair air movement.  Speaking in full sentences.  Cardiovascular system: S1 & S2 heard, RRR. No JVD, murmurs, rubs, gallops or clicks. No pedal edema. Gastrointestinal system: Abdomen is nondistended, soft and nontender. No organomegaly or masses felt. Normal bowel sounds heard. Central nervous system: Alert and oriented. No focal neurological deficits. Extremities: Symmetric 5 x 5 power. Skin: No rashes, lesions or ulcers Psychiatry: Judgement and insight appear normal. Mood & affect appropriate.     Data Reviewed: I have personally reviewed following labs and imaging studies  CBC: Recent Labs  Lab 04/02/21 1626 04/03/21 1347 04/04/21 0503  WBC 4.6 5.0 5.6  NEUTROABS 3.0 3.1 3.9  HGB 15.0 14.2 15.4  HCT 45.3 43.0 46.2  MCV 99.6 100.0 99.1  PLT 174 167 536    Basic Metabolic Panel: Recent Labs  Lab 04/02/21 1626 04/03/21 1347 04/04/21 0503  NA 139 140 138  K 4.0 3.7 3.8  CL 99 99 100  CO2 33* 36* 30  GLUCOSE 106* 106* 119*  BUN 12 11 12   CREATININE 0.89 0.84 0.80  CALCIUM 8.7* 8.4*  8.7*  MG  --   --  2.3  PHOS  --   --  4.2    GFR: Estimated Creatinine Clearance: 93 mL/min (by C-G formula based on SCr of 0.8 mg/dL).  Liver Function Tests: Recent Labs  Lab 04/02/21 1626 04/03/21 1347 04/04/21 0503  AST 15 16 15   ALT 12 14 16   ALKPHOS 50 46 48  BILITOT 0.4 0.4 0.7  PROT 6.6 6.0* 6.5  ALBUMIN 4.0 3.3* 3.8    CBG: No results for input(s): GLUCAP in the last 168 hours.   Recent Results (from the past 240 hour(s))  Blood Culture (routine x 2)     Status: None (Preliminary result)   Collection Time: 04/02/21  4:24 PM   Specimen: BLOOD  Result Value Ref Range Status   Specimen Description   Final    BLOOD RIGHT ANTECUBITAL Performed at [facility], [address] [place], [place] [address]    Special Requests   Final    Blood Culture adequate volume BOTTLES DRAWN AEROBIC AND ANAEROBIC Performed at [facility], [address] [place], [place] [address]    Culture   Final    NO GROWTH 2 DAYS Performed at [facility], [place] [address] [place], [place] [address]    Report Status PENDING  Incomplete  Blood Culture (routine x 2)     Status: None (Preliminary result)   Collection Time: 04/02/21  4:26 PM   Specimen: BLOOD  Result Value Ref Range Status   Specimen Description   Final    BLOOD LEFT ANTECUBITAL Performed at [facility], [address], [place], [place] [address]    Special Requests   Final    Blood Culture adequate volume BOTTLES DRAWN AEROBIC AND ANAEROBIC Performed at [facility], [address], [place], [place] [address]    Culture   Final    NO GROWTH 2 DAYS Performed at [facility], [place] [address] [place], [place] [address]    Report Status PENDING  Incomplete  Resp Panel by RT-PCR (Flu A&B, Covid) Nasopharyngeal Swab     Status: Abnormal   Collection Time: 04/02/21  4:26 PM   Specimen: Nasopharyngeal Swab; Nasopharyngeal(NP)  swabs in vial transport medium  Result Value Ref Range Status   SARS Coronavirus 2 by RT PCR POSITIVE (A) NEGATIVE Final    Comment: (NOTE) SARS-CoV-2 target nucleic acids are DETECTED.  The SARS-CoV-2 RNA is generally detectable in upper respiratory specimens during the acute phase of infection. Positive results are indicative of the presence of the identified virus, but do not rule out bacterial infection or co-infection with other pathogens not detected by the test. Clinical correlation with patient history and other diagnostic information is necessary to determine patient infection status. The expected result is Negative.  Fact Sheet for Patients: EntrepreneurPulse.com.au  Fact Sheet for Healthcare Providers: IncredibleEmployment.be  This test is not yet approved or cleared by the [place] FDA and  has been authorized for detection and/or diagnosis of  SARS-CoV-2 by FDA under an Emergency Use Authorization (EUA).  This EUA will remain in effect (meaning this test can be used) for the duration of  the COVID-19 declaration under Section 564(b)(1) of the A ct, 21 U.S.C. section 360bbb-3(b)(1), unless the authorization is terminated or revoked sooner.     Influenza A by PCR NEGATIVE NEGATIVE Final   Influenza B by PCR NEGATIVE NEGATIVE Final    Comment: (NOTE) The Xpert Xpress SARS-CoV-2/FLU/RSV plus assay is intended as an aid in the diagnosis of influenza from Nasopharyngeal swab specimens and should not be used as a sole basis for treatment. Nasal washings and aspirates are unacceptable for Xpert Xpress SARS-CoV-2/FLU/RSV testing.  Fact Sheet for Patients: EntrepreneurPulse.com.au  Fact Sheet for Healthcare Providers: IncredibleEmployment.be  This test is not yet approved or cleared by the [place] FDA and has been authorized for detection and/or diagnosis of SARS-CoV-2 by FDA under an  Emergency Use Authorization (EUA). This EUA will remain in effect (meaning this test can be used) for the duration of the COVID-19 declaration under Section 564(b)(1) of the Act, 21 U.S.C. section 360bbb-3(b)(1), unless the authorization is terminated or revoked.  Performed at [facility], [address] [place],  [address]          Radiology Studies: CT Angio Chest PE W and/or Wo Contrast  Result Date: 04/02/2021 CLINICAL DATA:  Pulmonary embolism suspected. Shortness of breath and low oxygen saturations for 2 days. Positive COVID test. EXAM: CT ANGIOGRAPHY CHEST WITH CONTRAST TECHNIQUE: Multidetector CT imaging of the chest was performed using the standard protocol during bolus administration of intravenous contrast. Multiplanar CT image reconstructions and MIPs were obtained to evaluate the vascular anatomy. RADIATION DOSE REDUCTION: This exam was performed according to the departmental dose-optimization program which includes automated exposure control, adjustment of the mA and/or kV according to patient size and/or use of iterative reconstruction technique. CONTRAST:  132mL OMNIPAQUE IOHEXOL 350 MG/ML SOLN COMPARISON:  03/05/2021 and 07/02/2012 FINDINGS: Cardiovascular: Heart is normal in size. Coronary artery calcifications are noted. The ascending aorta is partially calcified but not aneurysmal. Normal arch anatomy. Pulmonary arteries are well opacified by contrast bolus and there is no evidence for acute pulmonary embolus. Mediastinum/Nodes: 2.4 centimeter LEFT thyroid nodule, similar to technically adequate exam showing no acute pulmonary study and stable compared to exam 07/02/2012. esophagus is unremarkable. No significant mediastinal, hilar, or axillary adenopathy. Lungs/Pleura: Significant and diffuse panlobular emphysema with bullous changes in the apices. There is architectural distortion within the RIGHT UPPER lobe. No suspicious mass or nodule. No  consolidations or pleural effusions. Upper Abdomen: Homogeneous low-attenuation LEFT adrenal mass is stable in appearance, 3.6 centimeters in diameter. There is atherosclerotic calcification of the abdominal aorta. Musculoskeletal: No chest wall abnormality. No acute or significant osseous findings. Review of the MIP images confirms the above findings. IMPRESSION: 1. Technically adequate exam showing no acute pulmonary embolus. 2.  Emphysema (ICD10-J43.9). 3.  Aortic Atherosclerosis (ICD10-I70.0). 4. Stable LEFT thyroid nodule. Not clinically significant; no follow-up imaging recommended (ref: J Am Coll Radiol. 2015 Feb;12(2): 143-50). 5. Benign, stable LEFT adrenal nodule.  No follow-up imaging needed. Electronically Signed   By: Nolon Nations M.D.   On: 04/02/2021 18:22   DG Chest Portable 1 View  Result Date: 04/02/2021 CLINICAL DATA:  Shortness of breath.  COVID positive. EXAM: PORTABLE CHEST 1 VIEW COMPARISON:  06/30/2012 FINDINGS: Lungs are hyperexpanded. Right upper lobe pulmonary nodule compatible with scarring seen on lung cancer screening CT 03/05/2021. No focal airspace  consolidation. No pleural effusion. No evidence for pneumothorax. The cardiopericardial silhouette is within normal limits for size. Bones are diffusely demineralized. IMPRESSION: Hyperexpansion without acute cardiopulmonary findings. Electronically Signed   By: Misty Stanley M.D.   On: 04/02/2021 16:20        Scheduled Meds:  vitamin C  500 mg Oral Daily   atorvastatin  10 mg Oral Daily   dexamethasone (DECADRON) injection  6 mg Intravenous Q24H   fluticasone  2 spray Each Nare Daily   ipratropium-albuterol  3 mL Nebulization Q6H   latanoprost  1 drop Both Eyes QHS   loratadine  10 mg Oral Daily   niacin  500 mg Oral QHS   pantoprazole  40 mg Oral Daily   rivaroxaban  20 mg Oral Q supper   vitamin E  400 Units Oral QHS   zinc sulfate  220 mg Oral Daily   Continuous Infusions:  remdesivir 100 mg in NS 100 mL 100  mg (04/04/21 1040)     LOS: 0 days    Time spent: 40 minutes    Irine Seal, MD [facility]   To contact the attending provider between 7A-7P or the covering provider during after hours 7P-7A, please log into the web site [email or web address] and access using universal [place] password for that web site. If you do not have the password, please call the hospital operator.  04/04/2021, 12:15 PM

## 2021-04-04 NOTE — Progress Notes (Signed)
°  Transition of Care [facility]) Screening Note   Patient Details  Name: Adam Gilmore Date of Birth: 10/22/1950   Transition of Care [facility]) CM/SW Contact:    Lennart Pall, LCSW Phone Number: 04/04/2021, 12:42 PM    Transition of Care Department [facility]) has reviewed patient and no TOC needs have been identified at this time. We will continue to monitor patient advancement through interdisciplinary progression rounds. If new patient transition needs arise, please place a TOC consult.

## 2021-04-05 DIAGNOSIS — E559 Vitamin D deficiency, unspecified: Secondary | ICD-10-CM

## 2021-04-05 LAB — CBC WITH DIFFERENTIAL/PLATELET
Abs Immature Granulocytes: 0.01 10*3/uL (ref 0.00–0.07)
Basophils Absolute: 0 10*3/uL (ref 0.0–0.1)
Basophils Relative: 0 %
Eosinophils Absolute: 0 10*3/uL (ref 0.0–0.5)
Eosinophils Relative: 0 %
HCT: 41 % (ref 39.0–52.0)
Hemoglobin: 13.8 g/dL (ref 13.0–17.0)
Immature Granulocytes: 0 %
Lymphocytes Relative: 14 %
Lymphs Abs: 0.6 10*3/uL — ABNORMAL LOW (ref 0.7–4.0)
MCH: 34 pg (ref 26.0–34.0)
MCHC: 33.7 g/dL (ref 30.0–36.0)
MCV: 101 fL — ABNORMAL HIGH (ref 80.0–100.0)
Monocytes Absolute: 0.3 10*3/uL (ref 0.1–1.0)
Monocytes Relative: 7 %
Neutro Abs: 3.2 10*3/uL (ref 1.7–7.7)
Neutrophils Relative %: 79 %
Platelets: 163 10*3/uL (ref 150–400)
RBC: 4.06 MIL/uL — ABNORMAL LOW (ref 4.22–5.81)
RDW: 12.1 % (ref 11.5–15.5)
WBC: 4 10*3/uL (ref 4.0–10.5)
nRBC: 0 % (ref 0.0–0.2)

## 2021-04-05 LAB — COMPREHENSIVE METABOLIC PANEL
ALT: 17 U/L (ref 0–44)
AST: 17 U/L (ref 15–41)
Albumin: 3.1 g/dL — ABNORMAL LOW (ref 3.5–5.0)
Alkaline Phosphatase: 40 U/L (ref 38–126)
Anion gap: 9 (ref 5–15)
BUN: 19 mg/dL (ref 8–23)
CO2: 29 mmol/L (ref 22–32)
Calcium: 8.4 mg/dL — ABNORMAL LOW (ref 8.9–10.3)
Chloride: 100 mmol/L (ref 98–111)
Creatinine, Ser: 0.8 mg/dL (ref 0.61–1.24)
GFR, Estimated: >60 mL/min (ref >60)
Glucose, Bld: 141 mg/dL — ABNORMAL HIGH (ref 70–99)
Potassium: 4.5 mmol/L (ref 3.5–5.1)
Sodium: 138 mmol/L (ref 135–145)
Total Bilirubin: 0.2 mg/dL — ABNORMAL LOW (ref 0.3–1.2)
Total Protein: 5.6 g/dL — ABNORMAL LOW (ref 6.5–8.1)

## 2021-04-05 LAB — PHOSPHORUS: Phosphorus: 4.6 mg/dL (ref 2.5–4.6)

## 2021-04-05 LAB — C-REACTIVE PROTEIN: CRP: 1.3 mg/dL — ABNORMAL HIGH (ref <1.0)

## 2021-04-05 LAB — FERRITIN: Ferritin: 42 ng/mL (ref 24–336)

## 2021-04-05 LAB — MAGNESIUM: Magnesium: 2.2 mg/dL (ref 1.7–2.4)

## 2021-04-05 LAB — VITAMIN D 25 HYDROXY (VIT D DEFICIENCY, FRACTURES): Vit D, 25-Hydroxy: 18.05 ng/mL — ABNORMAL LOW (ref 30–100)

## 2021-04-05 LAB — D-DIMER, QUANTITATIVE: D-Dimer, Quant: 0.28 ug/mL-FEU (ref 0.00–0.50)

## 2021-04-05 MED ORDER — CHOLECALCIFEROL 10 MCG (400 UNIT) PO TABS
800.0000 [IU] | ORAL_TABLET | Freq: Every day | ORAL | Status: DC
  Administered 2021-04-05 – 2021-04-07 (×3): 800 [IU] via ORAL
  Filled 2021-04-05 (×3): qty 2

## 2021-04-05 NOTE — Progress Notes (Signed)
Patient ambulated on RA in room oxygen saturations noted to stay above 90%.

## 2021-04-05 NOTE — Progress Notes (Signed)
OT Cancellation Note  Patient Details Name: Adam Gilmore MRN: [id number] DOB: 1951-02-09   Cancelled Treatment:    Reason Eval/Treat Not Completed: OT screened, no needs identified, will sign off. Per PT patient is independent with ADLs and in room ambulation.  Panayiotis Rainville L Aceson Labell 04/05/2021, 9:31 AM

## 2021-04-05 NOTE — Progress Notes (Signed)
Patient weaned to room air satting 94%.

## 2021-04-05 NOTE — Evaluation (Signed)
Physical Therapy Evaluation Patient Details Name: Adam Gilmore MRN: [id number] DOB: 1951/01/06 Today's Date: 04/05/2021  History of Present Illness  Pt admitted from home 2* SOB and dx with COVID.  Pt with hx of COPD, Prostate CA and recurrent acute L LE DVT  Clinical Impression  Pt admitted as above and demonstrating Independence in all basic mobility tasks including ambulation without assistive device. Pt primarily limited by decreased endurance and noted to desat with limited activity on RA to 84% with HR elevated to 98.  Pt able to ambulate 350' on 2L O2 and maintain sats at 95+% and HR in 70s.  RN aware.  Pt with no skilled PT needs at this time and PT service will sign off.     Recommendations for follow up therapy are one component of a multi-disciplinary discharge planning process, led by the attending physician.  Recommendations may be updated based on patient status, additional functional criteria and insurance authorization.  Follow Up Recommendations No PT follow up    Assistance Recommended at Discharge None  Patient can return home with the following       Equipment Recommendations None recommended by PT  Recommendations for Other Services       Functional Status Assessment Patient has had a recent decline in their functional status and demonstrates the ability to make significant improvements in function in a reasonable and predictable amount of time. (based on endurance only)     Precautions / Restrictions Restrictions Weight Bearing Restrictions: No      Mobility  Bed Mobility Overal bed mobility: Independent             General bed mobility comments: No physical assist    Transfers Overall transfer level: Modified independent Equipment used: None               General transfer comment: No physical assist    Ambulation/Gait Ambulation/Gait assistance: Supervision, Independent Gait Distance (Feet): 350 Feet Assistive device: None Gait  Pattern/deviations: WFL(Within Functional Limits)       General Gait Details: Pt ambulated unassisted but desat on RA  Stairs            Wheelchair Mobility    Modified Rankin (Stroke Patients Only)       Balance Overall balance assessment: Independent                                           Pertinent Vitals/Pain Pain Assessment Pain Assessment: No/denies pain    Home Living Family/patient expects to be discharged to:: Private residence Living Arrangements: Spouse/significant other Available Help at Discharge: Family;Available 24 hours/day Type of Home: House         Home Layout: Able to live on main level with bedroom/bathroom Home Equipment: None      Prior Function Prior Level of Function : Independent/Modified Independent             Mobility Comments: Just came back from cruise       Hand Dominance        Extremity/Trunk Assessment   Upper Extremity Assessment Upper Extremity Assessment: Overall WFL for tasks assessed    Lower Extremity Assessment Lower Extremity Assessment: Overall WFL for tasks assessed    Cervical / Trunk Assessment Cervical / Trunk Assessment: Normal  Communication   Communication: No difficulties  Cognition Arousal/Alertness: Awake/alert Behavior During Therapy: WFL for tasks assessed/performed  Overall Cognitive Status: Within Functional Limits for tasks assessed                                          General Comments      Exercises     Assessment/Plan    PT Assessment Patient does not need any further PT services  PT Problem List         PT Treatment Interventions      PT Goals (Current goals can be found in the Care Plan section)  Acute Rehab PT Goals Patient Stated Goal: HOME PT Goal Formulation: All assessment and education complete, DC therapy    Frequency       Co-evaluation               AM-PAC PT "6 Clicks" Mobility  Outcome Measure  Help needed turning from your back to your side while in a flat bed without using bedrails?: None Help needed moving from lying on your back to sitting on the side of a flat bed without using bedrails?: None Help needed moving to and from a bed to a chair (including a wheelchair)?: None Help needed standing up from a chair using your arms (e.g., wheelchair or bedside chair)?: None Help needed to walk in hospital room?: None Help needed climbing 3-5 steps with a railing? : None 6 Click Score: 24    End of Session Equipment Utilized During Treatment: Gait belt;Oxygen Activity Tolerance: Patient tolerated treatment well Patient left: in chair;with call bell/phone within reach Nurse Communication: Mobility status PT Visit Diagnosis: Difficulty in walking, not elsewhere classified (R26.2)    Time: 5170-0174 PT Time Calculation (min) (ACUTE ONLY): 20 min   Charges:   PT Evaluation $PT Eval Low Complexity: [place] [facility] Acute Rehabilitation Services Pager [phone] Office [phone]   Tracie Lindbloom 04/05/2021, 10:49 AM

## 2021-04-05 NOTE — Progress Notes (Signed)
PROGRESS NOTE    Adam Gilmore  PPI:[id number] DOB: 03/11/1951 DOA: 04/02/2021 PCP: [name], MD    Chief Complaint  Patient presents with   Shortness of Breath   COVID-19 Positive    Brief Narrative:  Patient 71 year old gentleman history of COPD with ongoing tobacco use, hyperlipidemia, prostate cancer, history of skin cancer, history of left lower extremity DVT presented to the ED with progressive worsening dyspnea x2 days after developing upper respiratory symptoms following a cruise 1 week prior to admission became hypoxic and presented to the ED.  COVID-19 PCR positive.  Patient placed on IV dexamethasone and IV remdesivir.   Assessment & Plan:   Principal Problem:   COVID-19 virus infection Active Problems:   Recurrent acute deep vein thrombosis (DVT) of left lower extremity (HCC)   Hyperlipidemia   Hypocalcemia   Acute respiratory failure with hypoxia (HCC)   Vitamin D deficiency  #1 acute respiratory failure with hypoxia secondary to COVID-19 virus infection -Patient admitted with hypoxia, progressive worsening dyspnea, COVID-19 PCR positive. -CT angiogram chest negative for PE. -Continue IV dexamethasone, IV remdesivir per pharmacy. -Follow inflammatory markers. -Continue vitamin C, zinc, Claritin, Flonase, scheduled duo nebs, PPI. -Supportive care.  2.  Recurrent acute left lower extremity DVT -Xarelto.  3.  Hyperlipidemia -Continue niacin, statin.   4.  Tobacco abuse -Tobacco cessation. -At this time patient not interested in nicotine patch.  5.  PSEUDOhypocalcemia -Calcium at 8.4. -Albumin at 3.1. -Corrected calcium at 9.12. -Vitamin D 25-hydroxy level at 18.05. -Place on oral vitamin D supplementation. -Outpatient follow-up.  6.  Vitamin D deficiency -25-hydroxy vitamin D level at 18.05. -Place on oral vitamin D 800 units daily and will need repeat vitamin D 25-hydroxy level in 3 months..   DVT prophylaxis: Xarelto Code Status:  Full Family Communication: Updated patient and wife at bedside. Disposition:   Status is: Inpatient  The patient will require care spanning > 2 midnights and should be moved to inpatient because: Severity of illness      Consultants:  None  Procedures:  CT angiogram chest 04/02/2021 Chest x-ray 04/02/2021   Antimicrobials:  IV remdesivir 04/03/2021>>>> 04/08/2021   Subjective: Sitting up in bed just finished eating lunch.  Feels breathing is improved overall.  No chest pain.  Oxygenation being weaned down.  Patient with sats of 97% on 2 L.    Objective: Vitals:   04/04/21 2015 04/05/21 0538 04/05/21 0745 04/05/21 1432  BP:  132/67  (!) 110/55  Pulse:  (!) 59  65  Resp:  18  17  Temp:  97.7 F (36.5 C)  98.5 F (36.9 C)  TempSrc:  Oral  Oral  SpO2: 96% 97% 97% 96%  Weight:      Height:        Intake/Output Summary (Last 24 hours) at 04/05/2021 1724 Last data filed at 04/05/2021 1100 Gross per 24 hour  Intake 700.4 ml  Output --  Net 700.4 ml   Filed Weights   04/02/21 1558  Weight: 86.7 kg    Examination:  General exam: NAD. Respiratory system: Decreasing expiratory wheezing.  Fair air movement.  Speaking in full sentences. Cardiovascular system: Regular rate and rhythm no murmurs rubs or gallops.  No JVD.  No lower extremity edema.  Gastrointestinal system: Abdomen is soft, nontender, nondistended, positive bowel sounds.  No rebound.  No guarding.  Central nervous system: Alert and oriented.  Moving extremities spontaneously.  No focal neurological deficits. Extremities: Symmetric 5 x 5 power. Skin: No  rashes, lesions or ulcers Psychiatry: Judgement and insight appear normal. Mood & affect appropriate.     Data Reviewed: I have personally reviewed following labs and imaging studies  CBC: Recent Labs  Lab 04/02/21 1626 04/03/21 1347 04/04/21 0503 04/05/21 0427  WBC 4.6 5.0 5.6 4.0  NEUTROABS 3.0 3.1 3.9 3.2  HGB 15.0 14.2 15.4 13.8  HCT 45.3 43.0  46.2 41.0  MCV 99.6 100.0 99.1 101.0*  PLT 174 167 180 185    Basic Metabolic Panel: Recent Labs  Lab 04/02/21 1626 04/03/21 1347 04/04/21 0503 04/05/21 0427  NA 139 140 138 138  K 4.0 3.7 3.8 4.5  CL 99 99 100 100  CO2 33* 36* 30 29  GLUCOSE 106* 106* 119* 141*  BUN 12 11 12 19   CREATININE 0.89 0.84 0.80 0.80  CALCIUM 8.7* 8.4* 8.7* 8.4*  MG  --   --  2.3 2.2  PHOS  --   --  4.2 4.6    GFR: Estimated Creatinine Clearance: 93 mL/min (by C-G formula based on SCr of 0.8 mg/dL).  Liver Function Tests: Recent Labs  Lab 04/02/21 1626 04/03/21 1347 04/04/21 0503 04/05/21 0427  AST 15 16 15 17   ALT 12 14 16 17   ALKPHOS 50 46 48 40  BILITOT 0.4 0.4 0.7 0.2*  PROT 6.6 6.0* 6.5 5.6*  ALBUMIN 4.0 3.3* 3.8 3.1*    CBG: No results for input(s): GLUCAP in the last 168 hours.   Recent Results (from the past 240 hour(s))  Blood Culture (routine x 2)     Status: None (Preliminary result)   Collection Time: 04/02/21  4:24 PM   Specimen: BLOOD  Result Value Ref Range Status   Specimen Description   Final    BLOOD RIGHT ANTECUBITAL Performed at [facility], [address], [place], [place] [address]    Special Requests   Final    Blood Culture adequate volume BOTTLES DRAWN AEROBIC AND ANAEROBIC Performed at [facility], [address] [place], [place] [address]    Culture   Final    NO GROWTH 3 DAYS Performed at [facility], [place] [address], [place], [place] [address]    Report Status PENDING  Incomplete  Blood Culture (routine x 2)     Status: None (Preliminary result)   Collection Time: 04/02/21  4:26 PM   Specimen: BLOOD  Result Value Ref Range Status   Specimen Description   Final    BLOOD LEFT ANTECUBITAL Performed at [facility], [address], [place], [place] [address]    Special Requests   Final    Blood Culture adequate volume BOTTLES DRAWN AEROBIC AND ANAEROBIC Performed at [facility], [address], [place], [place] [address]    Culture   Final    NO GROWTH 3 DAYS Performed at [facility], [place] [address], [place], [place] [address]    Report Status PENDING  Incomplete  Resp Panel by RT-PCR (Flu A&B, Covid) Nasopharyngeal Swab     Status: Abnormal   Collection Time: 04/02/21  4:26 PM   Specimen: Nasopharyngeal Swab; Nasopharyngeal(NP) swabs in vial transport medium  Result Value Ref Range Status   SARS Coronavirus 2 by RT PCR POSITIVE (A) NEGATIVE Final    Comment: (NOTE) SARS-CoV-2 target nucleic acids are DETECTED.  The SARS-CoV-2 RNA is generally detectable in upper respiratory specimens during the acute phase of infection. Positive results are indicative of the presence of the identified virus,  but do not rule out bacterial infection or co-infection with other pathogens not detected by the test. Clinical correlation with patient history and other diagnostic information is necessary to determine patient infection status. The expected result is Negative.  Fact Sheet for Patients: EntrepreneurPulse.com.au  Fact Sheet for Healthcare Providers: IncredibleEmployment.be  This test is not yet approved or cleared by the [place] FDA and  has been authorized for detection and/or diagnosis of SARS-CoV-2 by FDA under an Emergency Use Authorization (EUA).  This EUA will remain in effect (meaning this test can be used) for the duration of  the COVID-19 declaration under Section 564(b)(1) of the A ct, 21 U.S.C. section 360bbb-3(b)(1), unless the authorization is terminated or revoked sooner.     Influenza A by PCR NEGATIVE NEGATIVE Final   Influenza B by PCR NEGATIVE NEGATIVE Final    Comment: (NOTE) The Xpert Xpress SARS-CoV-2/FLU/RSV plus assay is intended as an aid in the diagnosis of influenza from Nasopharyngeal swab specimens and should not be used as a sole basis for treatment. Nasal  washings and aspirates are unacceptable for Xpert Xpress SARS-CoV-2/FLU/RSV testing.  Fact Sheet for Patients: EntrepreneurPulse.com.au  Fact Sheet for Healthcare Providers: IncredibleEmployment.be  This test is not yet approved or cleared by the [place] FDA and has been authorized for detection and/or diagnosis of SARS-CoV-2 by FDA under an Emergency Use Authorization (EUA). This EUA will remain in effect (meaning this test can be used) for the duration of the COVID-19 declaration under Section 564(b)(1) of the Act, 21 U.S.C. section 360bbb-3(b)(1), unless the authorization is terminated or revoked.  Performed at [facility], [address] [place], [place] [address]          Radiology Studies: No results found.      Scheduled Meds:  vitamin C  500 mg Oral Daily   atorvastatin  10 mg Oral Daily   dexamethasone (DECADRON) injection  6 mg Intravenous Q24H   fluticasone  2 spray Each Nare Daily   ipratropium-albuterol  3 mL Nebulization TID   latanoprost  1 drop Both Eyes QHS   loratadine  10 mg Oral Daily   niacin  500 mg Oral QHS   pantoprazole  40 mg Oral Daily   rivaroxaban  20 mg Oral Q supper   vitamin E  400 Units Oral QHS   zinc sulfate  220 mg Oral Daily   Continuous Infusions:  remdesivir 100 mg in NS 100 mL 100 mg (04/05/21 1021)     LOS: 1 day    Time spent: 35 minutes    Irine Seal, MD [facility]   To contact the attending provider between 7A-7P or the covering provider during after hours 7P-7A, please log into the web site [email or web address] and access using universal [place] password for that web site. If you do not have the password, please call the hospital operator.  04/05/2021, 5:24 PM

## 2021-04-05 NOTE — Progress Notes (Deleted)
Patient successfully weaned to 1L of oxygen.

## 2021-04-06 LAB — CBC WITH DIFFERENTIAL/PLATELET
Abs Immature Granulocytes: 0.01 10*3/uL (ref 0.00–0.07)
Basophils Absolute: 0 10*3/uL (ref 0.0–0.1)
Basophils Relative: 0 %
Eosinophils Absolute: 0 10*3/uL (ref 0.0–0.5)
Eosinophils Relative: 0 %
HCT: 41.3 % (ref 39.0–52.0)
Hemoglobin: 14.1 g/dL (ref 13.0–17.0)
Immature Granulocytes: 0 %
Lymphocytes Relative: 16 %
Lymphs Abs: 0.8 10*3/uL (ref 0.7–4.0)
MCH: 33.8 pg (ref 26.0–34.0)
MCHC: 34.1 g/dL (ref 30.0–36.0)
MCV: 99 fL (ref 80.0–100.0)
Monocytes Absolute: 0.4 10*3/uL (ref 0.1–1.0)
Monocytes Relative: 7 %
Neutro Abs: 4.1 10*3/uL (ref 1.7–7.7)
Neutrophils Relative %: 77 %
Platelets: 176 10*3/uL (ref 150–400)
RBC: 4.17 MIL/uL — ABNORMAL LOW (ref 4.22–5.81)
RDW: 11.9 % (ref 11.5–15.5)
WBC: 5.4 10*3/uL (ref 4.0–10.5)
nRBC: 0 % (ref 0.0–0.2)

## 2021-04-06 LAB — C-REACTIVE PROTEIN: CRP: 1 mg/dL — ABNORMAL HIGH (ref <1.0)

## 2021-04-06 LAB — COMPREHENSIVE METABOLIC PANEL
ALT: 23 U/L (ref 0–44)
AST: 16 U/L (ref 15–41)
Albumin: 3.3 g/dL — ABNORMAL LOW (ref 3.5–5.0)
Alkaline Phosphatase: 41 U/L (ref 38–126)
Anion gap: 6 (ref 5–15)
BUN: 14 mg/dL (ref 8–23)
CO2: 32 mmol/L (ref 22–32)
Calcium: 8.3 mg/dL — ABNORMAL LOW (ref 8.9–10.3)
Chloride: 101 mmol/L (ref 98–111)
Creatinine, Ser: 0.83 mg/dL (ref 0.61–1.24)
GFR, Estimated: >60 mL/min (ref >60)
Glucose, Bld: 123 mg/dL — ABNORMAL HIGH (ref 70–99)
Potassium: 3.9 mmol/L (ref 3.5–5.1)
Sodium: 139 mmol/L (ref 135–145)
Total Bilirubin: 0.4 mg/dL (ref 0.3–1.2)
Total Protein: 5.8 g/dL — ABNORMAL LOW (ref 6.5–8.1)

## 2021-04-06 LAB — PHOSPHORUS: Phosphorus: 3.4 mg/dL (ref 2.5–4.6)

## 2021-04-06 LAB — MAGNESIUM: Magnesium: 2.3 mg/dL (ref 1.7–2.4)

## 2021-04-06 LAB — FERRITIN: Ferritin: 91 ng/mL (ref 24–336)

## 2021-04-06 LAB — D-DIMER, QUANTITATIVE: D-Dimer, Quant: 0.27 ug/mL-FEU (ref 0.00–0.50)

## 2021-04-06 MED ORDER — POTASSIUM CHLORIDE CRYS ER 10 MEQ PO TBCR
40.0000 meq | EXTENDED_RELEASE_TABLET | Freq: Once | ORAL | Status: DC
  Filled 2021-04-06: qty 4

## 2021-04-06 MED ORDER — FUROSEMIDE 10 MG/ML IJ SOLN
20.0000 mg | Freq: Once | INTRAMUSCULAR | Status: AC
  Administered 2021-04-06: 20 mg via INTRAVENOUS
  Filled 2021-04-06: qty 2

## 2021-04-06 MED ORDER — FUROSEMIDE 10 MG/ML IJ SOLN: 20.0000 mg | Freq: Two times a day (BID) | INTRAMUSCULAR | Status: DC

## 2021-04-06 MED ORDER — IPRATROPIUM-ALBUTEROL 0.5-2.5 (3) MG/3ML IN SOLN
3.0000 mL | Freq: Two times a day (BID) | RESPIRATORY_TRACT | Status: DC
  Filled 2021-04-06: qty 3

## 2021-04-06 NOTE — Progress Notes (Signed)
PROGRESS NOTE    Adam ROBINETTE  RSW:[id number] DOB: 10/07/1950 DOA: 04/02/2021 PCP: [name], MD    Chief Complaint  Patient presents with   Shortness of Breath   COVID-19 Positive    Brief Narrative:  Patient 71 year old gentleman history of COPD with ongoing tobacco use, hyperlipidemia, prostate cancer, history of skin cancer, history of left lower extremity DVT presented to the ED with progressive worsening dyspnea x2 days after developing upper respiratory symptoms following a cruise 1 week prior to admission became hypoxic and presented to the ED.  COVID-19 PCR positive.  Patient placed on IV dexamethasone and IV remdesivir.   Assessment & Plan:   Principal Problem:   COVID-19 virus infection Active Problems:   Recurrent acute deep vein thrombosis (DVT) of left lower extremity (HCC)   Hyperlipidemia   Hypocalcemia   Acute respiratory failure with hypoxia (HCC)   Vitamin D deficiency  #1 acute respiratory failure with hypoxia secondary to COVID-19 virus infection -Patient admitted with hypoxia, progressive worsening dyspnea, COVID-19 PCR positive. -CT angiogram chest negative for PE. -Continue IV dexamethasone, IV remdesivir per pharmacy. -Inflammatory markers trending down.   -Clinical improvement.   -Per RN patient noted to desat with sats in the high 80s on ambulation however sats of 95% on room air at rest. -Continue vitamin C, zinc, Claritin, Flonase, scheduled duo nebs, PPI. -Lasix 20 mg IV x2 doses. -Strict I's and O's, daily weights. -Supportive care.  2.  Recurrent acute left lower extremity DVT -Continue Xarelto.  3.  Hyperlipidemia -Statin, niacin.    4.  Tobacco abuse -Tobacco cessation. -At this time patient not interested in nicotine patch.  5.  PSEUDOhypocalcemia -Calcium at 8.4. -Albumin at 3.1. -Corrected calcium at 9.12. -Vitamin D 25-hydroxy level at 18.05. -Continue oral vitamin D supplementation.   -Outpatient follow-up.    6.  Vitamin D deficiency -25-hydroxy vitamin D level at 18.05. -Continue oral vitamin D 800 units daily and will need repeat vitamin D 25-hydroxy level in 3 months..   DVT prophylaxis: Xarelto Code Status: Full Family Communication: Updated patient and wife at bedside. Disposition:   Status is: Inpatient  The patient will require care spanning > 2 midnights and should be moved to inpatient because: Severity of illness      Consultants:  None  Procedures:  CT angiogram chest 04/02/2021 Chest x-ray 04/02/2021   Antimicrobials:  IV remdesivir 04/03/2021>>>> 04/08/2021   Subjective: Sitting up on the side of the bed.  No chest pain.  No shortness of breath.  Overall feeling better.  Wife at bedside.  Objective: Vitals:   04/05/21 1432 04/05/21 2217 04/06/21 0537 04/06/21 1209  BP: (!) 110/55 (!) 162/73 127/68 123/66  Pulse: 65 72 63 84  Resp: 17 20 18 18   Temp: 98.5 F (36.9 C) 98.2 F (36.8 C) 98.4 F (36.9 C) 97.7 F (36.5 C)  TempSrc: Oral Oral Oral Oral  SpO2: 96% 94% 94% 95%  Weight:      Height:        Intake/Output Summary (Last 24 hours) at 04/06/2021 1237 Last data filed at 04/05/2021 2300 Gross per 24 hour  Intake 100 ml  Output --  Net 100 ml    Filed Weights   04/02/21 1558  Weight: 86.7 kg    Examination:  General exam: NAD.  Respiratory system: Lungs clear to auscultation bilaterally.  No wheezes, no crackles, no rhonchi.  Normal air movement.  Cardiovascular system: RRR regular rate and rhythm no murmurs rubs or gallops.  No JVD.  No lower extremity edema.  Gastrointestinal system: Abdomen is soft, nontender, nondistended, positive bowel sounds.  No rebound.  No guarding.   Central nervous system: Alert and oriented.  Moving extremities spontaneously.  No focal neurological deficits. Extremities: Symmetric 5 x 5 power. Skin: No rashes, lesions or ulcers Psychiatry: Judgement and insight appear normal. Mood & affect appropriate.      Data Reviewed: I have personally reviewed following labs and imaging studies  CBC: Recent Labs  Lab 04/02/21 1626 04/03/21 1347 04/04/21 0503 04/05/21 0427 04/06/21 0442  WBC 4.6 5.0 5.6 4.0 5.4  NEUTROABS 3.0 3.1 3.9 3.2 4.1  HGB 15.0 14.2 15.4 13.8 14.1  HCT 45.3 43.0 46.2 41.0 41.3  MCV 99.6 100.0 99.1 101.0* 99.0  PLT 174 167 180 163 176     Basic Metabolic Panel: Recent Labs  Lab 04/02/21 1626 04/03/21 1347 04/04/21 0503 04/05/21 0427 04/06/21 0442  NA 139 140 138 138 139  K 4.0 3.7 3.8 4.5 3.9  CL 99 99 100 100 101  CO2 33* 36* 30 29 32  GLUCOSE 106* 106* 119* 141* 123*  BUN 12 11 12 19 14   CREATININE 0.89 0.84 0.80 0.80 0.83  CALCIUM 8.7* 8.4* 8.7* 8.4* 8.3*  MG  --   --  2.3 2.2 2.3  PHOS  --   --  4.2 4.6 3.4     GFR: Estimated Creatinine Clearance: 89.6 mL/min (by C-G formula based on SCr of 0.83 mg/dL).  Liver Function Tests: Recent Labs  Lab 04/02/21 1626 04/03/21 1347 04/04/21 0503 04/05/21 0427 04/06/21 0442  AST 15 16 15 17 16   ALT 12 14 16 17 23   ALKPHOS 50 46 48 40 41  BILITOT 0.4 0.4 0.7 0.2* 0.4  PROT 6.6 6.0* 6.5 5.6* 5.8*  ALBUMIN 4.0 3.3* 3.8 3.1* 3.3*     CBG: No results for input(s): GLUCAP in the last 168 hours.   Recent Results (from the past 240 hour(s))  Blood Culture (routine x 2)     Status: None (Preliminary result)   Collection Time: 04/02/21  4:24 PM   Specimen: BLOOD  Result Value Ref Range Status   Specimen Description   Final    BLOOD RIGHT ANTECUBITAL Performed at [facility], [address], [place], [place] [address]    Special Requests   Final    Blood Culture adequate volume BOTTLES DRAWN AEROBIC AND ANAEROBIC Performed at [facility], [address] [place], [place] [address]    Culture   Final    NO GROWTH 4 DAYS Performed at [facility], [place] [address], [place], [place] [address]    Report Status PENDING  Incomplete  Blood Culture  (routine x 2)     Status: None (Preliminary result)   Collection Time: 04/02/21  4:26 PM   Specimen: BLOOD  Result Value Ref Range Status   Specimen Description   Final    BLOOD LEFT ANTECUBITAL Performed at [facility], [address] [place], [place] [address]    Special Requests   Final    Blood Culture adequate volume BOTTLES DRAWN AEROBIC AND ANAEROBIC Performed at [facility], [address], [place], [place] [address]    Culture   Final    NO GROWTH 4 DAYS Performed at [facility], [place] [address] [place], [place] [address]    Report Status PENDING  Incomplete  Resp Panel by RT-PCR (Flu A&B, Covid) Nasopharyngeal Swab  Status: Abnormal   Collection Time: 04/02/21  4:26 PM   Specimen: Nasopharyngeal Swab; Nasopharyngeal(NP) swabs in vial transport medium  Result Value Ref Range Status   SARS Coronavirus 2 by RT PCR POSITIVE (A) NEGATIVE Final    Comment: (NOTE) SARS-CoV-2 target nucleic acids are DETECTED.  The SARS-CoV-2 RNA is generally detectable in upper respiratory specimens during the acute phase of infection. Positive results are indicative of the presence of the identified virus, but do not rule out bacterial infection or co-infection with other pathogens not detected by the test. Clinical correlation with patient history and other diagnostic information is necessary to determine patient infection status. The expected result is Negative.  Fact Sheet for Patients: EntrepreneurPulse.com.au  Fact Sheet for Healthcare Providers: IncredibleEmployment.be  This test is not yet approved or cleared by the [place] FDA and  has been authorized for detection and/or diagnosis of SARS-CoV-2 by FDA under an Emergency Use Authorization (EUA).  This EUA will remain in effect (meaning this test can be used) for the duration of  the COVID-19 declaration under Section 564(b)(1) of the A  ct, 21 U.S.C. section 360bbb-3(b)(1), unless the authorization is terminated or revoked sooner.     Influenza A by PCR NEGATIVE NEGATIVE Final   Influenza B by PCR NEGATIVE NEGATIVE Final    Comment: (NOTE) The Xpert Xpress SARS-CoV-2/FLU/RSV plus assay is intended as an aid in the diagnosis of influenza from Nasopharyngeal swab specimens and should not be used as a sole basis for treatment. Nasal washings and aspirates are unacceptable for Xpert Xpress SARS-CoV-2/FLU/RSV testing.  Fact Sheet for Patients: EntrepreneurPulse.com.au  Fact Sheet for Healthcare Providers: IncredibleEmployment.be  This test is not yet approved or cleared by the [place] FDA and has been authorized for detection and/or diagnosis of SARS-CoV-2 by FDA under an Emergency Use Authorization (EUA). This EUA will remain in effect (meaning this test can be used) for the duration of the COVID-19 declaration under Section 564(b)(1) of the Act, 21 U.S.C. section 360bbb-3(b)(1), unless the authorization is terminated or revoked.  Performed at [facility], [address], [place], [place] [address]           Radiology Studies: No results found.      Scheduled Meds:  vitamin C  500 mg Oral Daily   atorvastatin  10 mg Oral Daily   cholecalciferol  800 Units Oral Daily   dexamethasone (DECADRON) injection  6 mg Intravenous Q24H   fluticasone  2 spray Each Nare Daily   furosemide  20 mg Intravenous Once   ipratropium-albuterol  3 mL Nebulization TID   latanoprost  1 drop Both Eyes QHS   loratadine  10 mg Oral Daily   niacin  500 mg Oral QHS   pantoprazole  40 mg Oral Daily   rivaroxaban  20 mg Oral Q supper   vitamin E  400 Units Oral QHS   zinc sulfate  220 mg Oral Daily   Continuous Infusions:  remdesivir 100 mg in NS 100 mL 100 mg (04/06/21 0930)     LOS: 2 days    Time spent: 35 minutes    Irine Seal, MD [facility]   To contact the attending provider between 7A-7P or the covering provider during after hours 7P-7A, please log into the web site [email or web address] and access using universal [place] password for that web site. If you do not have the password, please call the hospital operator.  04/06/2021, 12:37 PM

## 2021-04-06 NOTE — Progress Notes (Signed)
Patient re-ambulated on RA per patient request, patient noted to stay above 94% on RA.

## 2021-04-06 NOTE — Progress Notes (Signed)
Patient noted to desaturate on room air 88%/

## 2021-04-07 LAB — COMPREHENSIVE METABOLIC PANEL
ALT: 22 U/L (ref 0–44)
AST: 14 U/L — ABNORMAL LOW (ref 15–41)
Albumin: 3.4 g/dL — ABNORMAL LOW (ref 3.5–5.0)
Alkaline Phosphatase: 45 U/L (ref 38–126)
Anion gap: 7 (ref 5–15)
BUN: 15 mg/dL (ref 8–23)
CO2: 31 mmol/L (ref 22–32)
Calcium: 8.4 mg/dL — ABNORMAL LOW (ref 8.9–10.3)
Chloride: 100 mmol/L (ref 98–111)
Creatinine, Ser: 0.83 mg/dL (ref 0.61–1.24)
GFR, Estimated: >60 mL/min (ref >60)
Glucose, Bld: 125 mg/dL — ABNORMAL HIGH (ref 70–99)
Potassium: 3.6 mmol/L (ref 3.5–5.1)
Sodium: 138 mmol/L (ref 135–145)
Total Bilirubin: 0.7 mg/dL (ref 0.3–1.2)
Total Protein: 6 g/dL — ABNORMAL LOW (ref 6.5–8.1)

## 2021-04-07 LAB — CBC WITH DIFFERENTIAL/PLATELET
Abs Immature Granulocytes: 0 10*3/uL (ref 0.00–0.07)
Basophils Absolute: 0 10*3/uL (ref 0.0–0.1)
Basophils Relative: 0 %
Eosinophils Absolute: 0 10*3/uL (ref 0.0–0.5)
Eosinophils Relative: 0 %
HCT: 43.6 % (ref 39.0–52.0)
Hemoglobin: 14.6 g/dL (ref 13.0–17.0)
Immature Granulocytes: 0 %
Lymphocytes Relative: 17 %
Lymphs Abs: 0.8 10*3/uL (ref 0.7–4.0)
MCH: 33 pg (ref 26.0–34.0)
MCHC: 33.5 g/dL (ref 30.0–36.0)
MCV: 98.4 fL (ref 80.0–100.0)
Monocytes Absolute: 0.3 10*3/uL (ref 0.1–1.0)
Monocytes Relative: 6 %
Neutro Abs: 3.9 10*3/uL (ref 1.7–7.7)
Neutrophils Relative %: 77 %
Platelets: 209 10*3/uL (ref 150–400)
RBC: 4.43 MIL/uL (ref 4.22–5.81)
RDW: 12 % (ref 11.5–15.5)
WBC: 5 10*3/uL (ref 4.0–10.5)
nRBC: 0 % (ref 0.0–0.2)

## 2021-04-07 LAB — CULTURE, BLOOD (ROUTINE X 2)
Culture: NO GROWTH
Culture: NO GROWTH
Special Requests: ADEQUATE
Special Requests: ADEQUATE

## 2021-04-07 LAB — MAGNESIUM: Magnesium: 2.1 mg/dL (ref 1.7–2.4)

## 2021-04-07 LAB — FERRITIN: Ferritin: 73 ng/mL (ref 24–336)

## 2021-04-07 LAB — C-REACTIVE PROTEIN: CRP: 0.8 mg/dL (ref <1.0)

## 2021-04-07 LAB — PHOSPHORUS: Phosphorus: 3.3 mg/dL (ref 2.5–4.6)

## 2021-04-07 LAB — D-DIMER, QUANTITATIVE: D-Dimer, Quant: <0.27 ug/mL-FEU (ref 0.00–0.50)

## 2021-04-07 MED ORDER — ZINC SULFATE 220 (50 ZN) MG PO CAPS: 220.0000 mg | ORAL_CAPSULE | Freq: Every day | ORAL | Status: DC

## 2021-04-07 MED ORDER — FLUTICASONE PROPIONATE 50 MCG/ACT NA SUSP: 2.0000 | Freq: Every day | NASAL | 0 refills | Status: DC

## 2021-04-07 MED ORDER — CHOLECALCIFEROL 10 MCG (400 UNIT) PO TABS: 800.0000 [IU] | ORAL_TABLET | Freq: Every day | ORAL | 1 refills | Status: DC

## 2021-04-07 MED ORDER — DEXAMETHASONE 6 MG PO TABS: 6.0000 mg | ORAL_TABLET | Freq: Every day | ORAL | 0 refills | Status: AC

## 2021-04-07 MED ORDER — DEXAMETHASONE 4 MG PO TABS
6.0000 mg | ORAL_TABLET | Freq: Every day | ORAL | Status: DC
  Administered 2021-04-07: 6 mg via ORAL
  Filled 2021-04-07: qty 2

## 2021-04-07 MED ORDER — PANTOPRAZOLE SODIUM 40 MG PO TBEC: 40.0000 mg | DELAYED_RELEASE_TABLET | Freq: Every day | ORAL | 0 refills | Status: DC

## 2021-04-07 MED ORDER — POTASSIUM CHLORIDE CRYS ER 10 MEQ PO TBCR
40.0000 meq | EXTENDED_RELEASE_TABLET | Freq: Once | ORAL | Status: AC
  Administered 2021-04-07: 40 meq via ORAL
  Filled 2021-04-07: qty 4

## 2021-04-07 MED ORDER — IPRATROPIUM-ALBUTEROL 20-100 MCG/ACT IN AERS: 2.0000 | INHALATION_SPRAY | Freq: Four times a day (QID) | RESPIRATORY_TRACT | 1 refills | Status: DC | PRN

## 2021-04-07 MED ORDER — LORATADINE 10 MG PO TABS: 10.0000 mg | ORAL_TABLET | Freq: Every day | ORAL | 1 refills | Status: DC

## 2021-04-07 NOTE — Progress Notes (Signed)
Patient states that he had allergic reaction to breathing treatment. He stated that his body turned red during the reaction. When further questioned he stated that he received breathing treatment and antibiotics about the same time and not long after his skin turned red so he did not know what caused it. He continues stating that he was given Benadryl by the nurse which helped with his reaction and was administered benadryl again before his second breathing treatment to which he had no allergic reaction the second time. He states that he do not want any additional breathing treatments.  When asked which breathing treatment and antibiotics he was referring to he stated he does not know which breathing treatment he received and named the antiviral Remdesivir as the drug administered around the time of his reaction. Patient was educated on the use of Duoneb and Remdesivir and verbalized understanding. Respiratory later called to advise of patients refusal and to make nurse aware of patients' report of allergic reaction. Patient remains in bed resting, no signs of acute distress, no additional needs at this time. Will continue to monitor.

## 2021-04-07 NOTE — Discharge Summary (Signed)
Physician Discharge Summary  Adam Gilmore DJT:[id number] DOB: 04/03/1950 DOA: 04/02/2021  PCP: [name], MD  Admit date: 04/02/2021 Discharge date: 04/07/2021  Time spent: 60 minutes  Recommendations for Outpatient Follow-up:  Follow-up with [name], MD in 3 weeks.  On follow-up patient will need a basic metabolic profile done to follow-up on electrolytes and renal function.  Patient's vitamin D deficiency will need to be followed up upon.   Discharge Diagnoses:  Principal Problem:   COVID-19 virus infection Active Problems:   Recurrent acute deep vein thrombosis (DVT) of left lower extremity (HCC)   Hyperlipidemia   Hypocalcemia   Acute respiratory failure with hypoxia (HCC)   Vitamin D deficiency   Discharge Condition: Stable and improved  Diet recommendation: Heart healthy  Filed Weights   04/02/21 1558 04/07/21 0500  Weight: 86.7 kg 77.3 kg    History of present illness:  HPI per Dr. Onnie Graham is a 71 y.o. male with medical history significant of COPD, hyperlipidemia, prostate cancer, history of skin cancer,, history of LLE DVT, overweight who is coming to the emergency department with complaints of progressively worse dyspnea for the past 2 days after developing URI symptoms following a cruise last week and then becoming hypoxic Saturday evening.  He denied chest pain, palpitations, diaphoresis, PND, orthopnea or pitting edema of the lower extremities.  His LLE is usually is slightly bigger after he had the DVT.  No abdominal pain, nausea, emesis, diarrhea, constipation, melena or hematochezia.  No flank pain, dysuria, frequency or hematuria.   ED Course: Initial vital signs were temperature Initial vital signs were temperature 98.2 F, pulse 83, respiration 18, BP 166/75 mmHg and O2 sat 83% on room air.  He is currently 97% on 2 LPM via nasal cannula.  He received dexamethasone 6 mg IVP and remdesivir in the emergency department.   Lab work:  His CBC, D-dimer, procalcitonin and lactic acid were normal.  CRP was 4.0 and fibrinogen was 526 mg/dL.  CMP showed normal LFTs, renal function and electrolytes, except for CO2 that was 33 mmol/L.  Glucose 106 and calcium 8.7 mg/dL.   Imaging: No acute findings.  Stable thyroid and adrenal nodules.  Please see images and full radiology report for further details.  Hospital Course:  #1 acute respiratory failure with hypoxia secondary to COVID-19 virus infection -Patient admitted with hypoxia, progressive worsening dyspnea, COVID-19 PCR positive. -CT angiogram chest negative for PE. -Patient placed on IV remdesivir and completed course during the hospitalization.  Patient also maintained on IV dexamethasone.  -Patient also placed on vitamin C, zinc, Claritin, Flonase, scheduled duo nebs, PPI. -Patient received 2 doses of Lasix 20 mg IV with urine output of 2.7 L. -Inflammatory markers trended down during the hospitalization.   -Patient improved clinically.   -Ambulatory sats were checked and patient had improved with sats greater than 93% on room air with ambulation by day of discharge.  -Patient with discharge home on 4 more days of oral Decadron to complete a 10-day course of treatment, Combivent inhaler, vitamin C, zinc. -Outpatient follow-up with PCP.  2.  Recurrent acute left lower extremity DVT -Patient maintained on home regimen Xarelto.  3.  Hyperlipidemia -Patient maintained on home regimen statin and niacin during the hospitalization.   4.  Tobacco abuse -Tobacco cessation stressed to patient during the hospitalization. -Outpatient follow-up with PCP.  5.  PSEUDOhypocalcemia -Calcium at 8.4. -Albumin at 3.1. -Corrected calcium at 9.12. -Vitamin D 25-hydroxy level at 18.05. -Patient started  on oral vitamin D supplementation during the hospitalization..   -Outpatient follow-up.   6.  Vitamin D deficiency -25-hydroxy vitamin D level at 18.05. -Patient started on oral vitamin  D 800 units daily and will need repeat vitamin D 25-hydroxy level in 3 months. -Outpatient follow-up with PCP for      Procedures: CT angiogram chest 04/02/2021 Chest x-ray 04/02/2021  Consultations: None  Discharge Exam: Vitals:   04/07/21 0329 04/07/21 0550  BP:  118/63  Pulse:  63  Resp:  20  Temp:  97.8 F (36.6 C)  SpO2: 92% 94%    General: NAD. Cardiovascular: RRR no murmurs rubs or gallops.  No JVD.  No lower extremity edema. Respiratory: Lungs clear to auscultation bilaterally.  No wheezes, no crackles, no rhonchi.  Fair air movement.  Speaking in full sentences.  Discharge Instructions   Discharge Instructions     Diet - low sodium heart healthy   Complete by: As directed    Discharge instructions   Complete by: As directed    ?   Person Under Monitoring Name: Adam Gilmore  Location: [address] [place] [address]   Infection Prevention Recommendations for Individuals Confirmed to have, or Being Evaluated for, 2019 Novel Coronavirus (COVID-19) Infection Who Receive Care at Home  Individuals who are confirmed to have, or are being evaluated for, COVID-19 should follow the prevention steps below until a healthcare provider or local or state health department says they can return to normal activities.  Stay home except to get medical care You should restrict activities outside your home, except for getting medical care. Do not go to work, school, or public areas, and do not use public transportation or taxis.  Call ahead before visiting your doctor Before your medical appointment, call the healthcare provider and tell them that you have, or are being evaluated for, COVID-19 infection. This will help the healthcare provider's office take steps to keep other people from getting infected. Ask your healthcare provider to call the local or state health department.  Monitor your symptoms Seek prompt medical attention if your illness is worsening  (e.g., difficulty breathing). Before going to your medical appointment, call the healthcare provider and tell them that you have, or are being evaluated for, COVID-19 infection. Ask your healthcare provider to call the local or state health department.  Wear a facemask You should wear a facemask that covers your nose and mouth when you are in the same room with other people and when you visit a healthcare provider. People who live with or visit you should also wear a facemask while they are in the same room with you.  Separate yourself from other people in your home As much as possible, you should stay in a different room from other people in your home. Also, you should use a separate bathroom, if available.  Avoid sharing household items You should not share dishes, drinking glasses, cups, eating utensils, towels, bedding, or other items with other people in your home. After using these items, you should wash them thoroughly with soap and water.  Cover your coughs and sneezes Cover your mouth and nose with a tissue when you cough or sneeze, or you can cough or sneeze into your sleeve. Throw used tissues in a lined trash can, and immediately wash your hands with soap and water for at least 20 seconds or use an alcohol-based hand rub.  Wash your [facility] your hands often and thoroughly with soap and water for  at least 20 seconds. You can use an alcohol-based hand sanitizer if soap and water are not available and if your hands are not visibly dirty. Avoid touching your eyes, nose, and mouth with unwashed hands.   Prevention Steps for Caregivers and Household Members of Individuals Confirmed to have, or Being Evaluated for, COVID-19 Infection Being Cared for in the Home  If you live with, or provide care at home for, a person confirmed to have, or being evaluated for, COVID-19 infection please follow these guidelines to prevent infection:  Follow healthcare provider's  instructions Make sure that you understand and can help the patient follow any healthcare provider instructions for all care.  Provide for the patient's basic needs You should help the patient with basic needs in the home and provide support for getting groceries, prescriptions, and other personal needs.  Monitor the patient's symptoms If they are getting sicker, call his or her medical provider and tell them that the patient has, or is being evaluated for, COVID-19 infection. This will help the healthcare provider's office take steps to keep other people from getting infected. Ask the healthcare provider to call the local or state health department.  Limit the number of people who have contact with the patient If possible, have only one caregiver for the patient. Other household members should stay in another home or place of residence. If this is not possible, they should stay in another room, or be separated from the patient as much as possible. Use a separate bathroom, if available. Restrict visitors who do not have an essential need to be in the home.  Keep older adults, very young children, and other sick people away from the patient Keep older adults, very young children, and those who have compromised immune systems or chronic health conditions away from the patient. This includes people with chronic heart, lung, or kidney conditions, diabetes, and cancer.  Ensure good ventilation Make sure that shared spaces in the home have good air flow, such as from an air conditioner or an opened window, weather permitting.  Wash your hands often Wash your hands often and thoroughly with soap and water for at least 20 seconds. You can use an alcohol based hand sanitizer if soap and water are not available and if your hands are not visibly dirty. Avoid touching your eyes, nose, and mouth with unwashed hands. Use disposable paper towels to dry your hands. If not available, use dedicated cloth  towels and replace them when they become wet.  Wear a facemask and gloves Wear a disposable facemask at all times in the room and gloves when you touch or have contact with the patient's blood, body fluids, and/or secretions or excretions, such as sweat, saliva, sputum, nasal mucus, vomit, urine, or feces.  Ensure the mask fits over your nose and mouth tightly, and do not touch it during use. Throw out disposable facemasks and gloves after using them. Do not reuse. Wash your hands immediately after removing your facemask and gloves. If your personal clothing becomes contaminated, carefully remove clothing and launder. Wash your hands after handling contaminated clothing. Place all used disposable facemasks, gloves, and other waste in a lined container before disposing them with other household waste. Remove gloves and wash your hands immediately after handling these items.  Do not share dishes, glasses, or other household items with the patient Avoid sharing household items. You should not share dishes, drinking glasses, cups, eating utensils, towels, bedding, or other items with a patient who is  confirmed to have, or being evaluated for, COVID-19 infection. After the person uses these items, you should wash them thoroughly with soap and water.  Wash laundry thoroughly Immediately remove and wash clothes or bedding that have blood, body fluids, and/or secretions or excretions, such as sweat, saliva, sputum, nasal mucus, vomit, urine, or feces, on them. Wear gloves when handling laundry from the patient. Read and follow directions on labels of laundry or clothing items and detergent. In general, wash and dry with the warmest temperatures recommended on the label.  Clean all areas the individual has used often Clean all touchable surfaces, such as counters, tabletops, doorknobs, bathroom fixtures, toilets, phones, keyboards, tablets, and bedside tables, every day. Also, clean any surfaces that may  have blood, body fluids, and/or secretions or excretions on them. Wear gloves when cleaning surfaces the patient has come in contact with. Use a diluted bleach solution (e.g., dilute bleach with 1 part bleach and 10 parts water) or a household disinfectant with a label that says EPA-registered for coronaviruses. To make a bleach solution at home, add 1 tablespoon of bleach to 1 quart (4 cups) of water. For a larger supply, add  cup of bleach to 1 gallon (16 cups) of water. Read labels of cleaning products and follow recommendations provided on product labels. Labels contain instructions for safe and effective use of the cleaning product including precautions you should take when applying the product, such as wearing gloves or eye protection and making sure you have good ventilation during use of the product. Remove gloves and wash hands immediately after cleaning.  Monitor yourself for signs and symptoms of illness Caregivers and household members are considered close contacts, should monitor their health, and will be asked to limit movement outside of the home to the extent possible. Follow the monitoring steps for close contacts listed on the symptom monitoring form.   ? If you have additional questions, contact your local health department or call the epidemiologist on call at [phone] (available 24/7). ? This guidance is subject to change. For the most up-to-date guidance from [facility], please refer to their website: YouBlogs.pl   Increase activity slowly   Complete by: As directed       Allergies as of 04/07/2021       Reactions   No Known Allergies         Medication List     TAKE these medications    acetaminophen 325 MG tablet Commonly known as: TYLENOL Take 325 mg by mouth every 6 (six) hours as needed (for pain.).   atorvastatin 10 MG tablet Commonly known as: LIPITOR Take 10 mg by mouth daily.    cholecalciferol 10 MCG (400 UNIT) Tabs tablet Commonly known as: VITAMIN D3 Take 2 tablets (800 Units total) by mouth daily. Start taking on: April 08, 2021   dexamethasone 6 MG tablet Commonly known as: DECADRON Take 1 tablet (6 mg total) by mouth daily for 4 days. Start taking on: April 08, 2021   fluticasone 50 MCG/ACT nasal spray Commonly known as: FLONASE Place 2 sprays into both nostrils daily. Start taking on: April 08, 2021   Ipratropium-Albuterol 20-100 MCG/ACT Aers respimat Commonly known as: COMBIVENT Inhale 2 puffs into the lungs every 6 (six) hours as needed for wheezing. Use 2 puffs 3 times daily x5 days, then every 6 hours as needed.   latanoprost 0.005 % ophthalmic solution Commonly known as: XALATAN Place 1 drop into both eyes at bedtime.   loratadine 10 MG tablet  Commonly known as: CLARITIN Take 1 tablet (10 mg total) by mouth daily. Start taking on: April 08, 2021   niacin 500 MG tablet Take 500 mg by mouth at bedtime.   pantoprazole 40 MG tablet Commonly known as: PROTONIX Take 1 tablet (40 mg total) by mouth daily. Start taking on: April 08, 2021   rivaroxaban 20 MG Tabs tablet Commonly known as: XARELTO Take 20 mg by mouth daily with supper.   vitamin C 500 MG tablet Commonly known as: ASCORBIC ACID Take 500 mg by mouth daily.   vitamin E 180 MG (400 UNITS) capsule Take 400 Units by mouth daily.   zinc sulfate 220 (50 Zn) MG capsule Take 1 capsule (220 mg total) by mouth daily. Start taking on: April 08, 2021       Allergies  Allergen Reactions   No Known Allergies     Follow-up Information     [name], MD Follow up in 3 week(s).   Specialty: Family Medicine Contact information: [address], [place] [address] [phone]                  The results of significant diagnostics from this hospitalization (including imaging, microbiology, ancillary and laboratory) are listed  below for reference.    Significant Diagnostic Studies: CT Angio Chest PE W and/or Wo Contrast  Result Date: 04/02/2021 CLINICAL DATA:  Pulmonary embolism suspected. Shortness of breath and low oxygen saturations for 2 days. Positive COVID test. EXAM: CT ANGIOGRAPHY CHEST WITH CONTRAST TECHNIQUE: Multidetector CT imaging of the chest was performed using the standard protocol during bolus administration of intravenous contrast. Multiplanar CT image reconstructions and MIPs were obtained to evaluate the vascular anatomy. RADIATION DOSE REDUCTION: This exam was performed according to the departmental dose-optimization program which includes automated exposure control, adjustment of the mA and/or kV according to patient size and/or use of iterative reconstruction technique. CONTRAST:  136mL OMNIPAQUE IOHEXOL 350 MG/ML SOLN COMPARISON:  03/05/2021 and 07/02/2012 FINDINGS: Cardiovascular: Heart is normal in size. Coronary artery calcifications are noted. The ascending aorta is partially calcified but not aneurysmal. Normal arch anatomy. Pulmonary arteries are well opacified by contrast bolus and there is no evidence for acute pulmonary embolus. Mediastinum/Nodes: 2.4 centimeter LEFT thyroid nodule, similar to technically adequate exam showing no acute pulmonary study and stable compared to exam 07/02/2012. esophagus is unremarkable. No significant mediastinal, hilar, or axillary adenopathy. Lungs/Pleura: Significant and diffuse panlobular emphysema with bullous changes in the apices. There is architectural distortion within the RIGHT UPPER lobe. No suspicious mass or nodule. No consolidations or pleural effusions. Upper Abdomen: Homogeneous low-attenuation LEFT adrenal mass is stable in appearance, 3.6 centimeters in diameter. There is atherosclerotic calcification of the abdominal aorta. Musculoskeletal: No chest wall abnormality. No acute or significant osseous findings. Review of the MIP images confirms the above  findings. IMPRESSION: 1. Technically adequate exam showing no acute pulmonary embolus. 2.  Emphysema (ICD10-J43.9). 3.  Aortic Atherosclerosis (ICD10-I70.0). 4. Stable LEFT thyroid nodule. Not clinically significant; no follow-up imaging recommended (ref: J Am Coll Radiol. 2015 Feb;12(2): 143-50). 5. Benign, stable LEFT adrenal nodule.  No follow-up imaging needed. Electronically Signed   By: Nolon Nations M.D.   On: 04/02/2021 18:22   DG Chest Portable 1 View  Result Date: 04/02/2021 CLINICAL DATA:  Shortness of breath.  COVID positive. EXAM: PORTABLE CHEST 1 VIEW COMPARISON:  06/30/2012 FINDINGS: Lungs are hyperexpanded. Right upper lobe pulmonary nodule compatible with scarring seen on lung cancer screening CT  03/05/2021. No focal airspace consolidation. No pleural effusion. No evidence for pneumothorax. The cardiopericardial silhouette is within normal limits for size. Bones are diffusely demineralized. IMPRESSION: Hyperexpansion without acute cardiopulmonary findings. Electronically Signed   By: Misty Stanley M.D.   On: 04/02/2021 16:20    Microbiology: Recent Results (from the past 240 hour(s))  Blood Culture (routine x 2)     Status: None   Collection Time: 04/02/21  4:24 PM   Specimen: BLOOD  Result Value Ref Range Status   Specimen Description   Final    BLOOD RIGHT ANTECUBITAL Performed at [facility], [address] [place], [place] [address]    Special Requests   Final    Blood Culture adequate volume BOTTLES DRAWN AEROBIC AND ANAEROBIC Performed at [facility], [address], [place], [place] [address]    Culture   Final    NO GROWTH 5 DAYS Performed at [facility], [place] [address], [place], [place] [address]    Report Status 04/07/2021 FINAL  Final  Blood Culture (routine x 2)     Status: None   Collection Time: 04/02/21  4:26 PM   Specimen: BLOOD  Result Value Ref Range Status   Specimen Description   Final    BLOOD  LEFT ANTECUBITAL Performed at [facility], [address] [place], [place] [address]    Special Requests   Final    Blood Culture adequate volume BOTTLES DRAWN AEROBIC AND ANAEROBIC Performed at [facility], [address], [place], [place] [address]    Culture   Final    NO GROWTH 5 DAYS Performed at [facility], [place] [address], [place],  [address]    Report Status 04/07/2021 FINAL  Final  Resp Panel by RT-PCR (Flu A&B, Covid) Nasopharyngeal Swab     Status: Abnormal   Collection Time: 04/02/21  4:26 PM   Specimen: Nasopharyngeal Swab; Nasopharyngeal(NP) swabs in vial transport medium  Result Value Ref Range Status   SARS Coronavirus 2 by RT PCR POSITIVE (A) NEGATIVE Final    Comment: (NOTE) SARS-CoV-2 target nucleic acids are DETECTED.  The SARS-CoV-2 RNA is generally detectable in upper respiratory specimens during the acute phase of infection. Positive results are indicative of the presence of the identified virus, but do not rule out bacterial infection or co-infection with other pathogens not detected by the test. Clinical correlation with patient history and other diagnostic information is necessary to determine patient infection status. The expected result is Negative.  Fact Sheet for Patients: EntrepreneurPulse.com.au  Fact Sheet for Healthcare Providers: IncredibleEmployment.be  This test is not yet approved or cleared by the [place] FDA and  has been authorized for detection and/or diagnosis of SARS-CoV-2 by FDA under an Emergency Use Authorization (EUA).  This EUA will remain in effect (meaning this test can be used) for the duration of  the COVID-19 declaration under Section 564(b)(1) of the A ct, 21 U.S.C. section 360bbb-3(b)(1), unless the authorization is terminated or revoked sooner.     Influenza A by PCR NEGATIVE NEGATIVE Final   Influenza B by PCR NEGATIVE  NEGATIVE Final    Comment: (NOTE) The Xpert Xpress SARS-CoV-2/FLU/RSV plus assay is intended as an aid in the diagnosis of influenza from Nasopharyngeal swab specimens and should not be used as a sole basis for treatment. Nasal washings and aspirates are unacceptable for Xpert Xpress SARS-CoV-2/FLU/RSV testing.  Fact Sheet for Patients: EntrepreneurPulse.com.au  Fact Sheet for Healthcare Providers: IncredibleEmployment.be  This test is not yet approved or cleared by the [place] and has been authorized for detection and/or diagnosis of SARS-CoV-2 by FDA under an Emergency Use Authorization (EUA). This EUA will remain in effect (meaning this test can be used) for the duration of the COVID-19 declaration under Section 564(b)(1) of the Act, 21 U.S.C. section 360bbb-3(b)(1), unless the authorization is terminated or revoked.  Performed at [facility], [address] [place], [place] [address]      Labs: Basic Metabolic Panel: Recent Labs  Lab 04/03/21 1347 04/04/21 0503 04/05/21 0427 04/06/21 0442 04/07/21 0505  NA 140 138 138 139 138  K 3.7 3.8 4.5 3.9 3.6  CL 99 100 100 101 100  CO2 36* 30 29 32 31  GLUCOSE 106* 119* 141* 123* 125*  BUN 11 12 19 14 15   CREATININE 0.84 0.80 0.80 0.83 0.83  CALCIUM 8.4* 8.7* 8.4* 8.3* 8.4*  MG  --  2.3 2.2 2.3 2.1  PHOS  --  4.2 4.6 3.4 3.3   Liver Function Tests: Recent Labs  Lab 04/03/21 1347 04/04/21 0503 04/05/21 0427 04/06/21 0442 04/07/21 0505  AST 16 15 17 16  14*  ALT 14 16 17 23 22   ALKPHOS 46 48 40 41 45  BILITOT 0.4 0.7 0.2* 0.4 0.7  PROT 6.0* 6.5 5.6* 5.8* 6.0*  ALBUMIN 3.3* 3.8 3.1* 3.3* 3.4*   No results for input(s): LIPASE, AMYLASE in the last 168 hours. No results for input(s): AMMONIA in the last 168 hours. CBC: Recent Labs  Lab 04/03/21 1347 04/04/21 0503 04/05/21 0427 04/06/21 0442 04/07/21 0505  WBC 5.0 5.6 4.0 5.4 5.0  NEUTROABS  3.1 3.9 3.2 4.1 3.9  HGB 14.2 15.4 13.8 14.1 14.6  HCT 43.0 46.2 41.0 41.3 43.6  MCV 100.0 99.1 101.0* 99.0 98.4  PLT 167 180 163 176 209   Cardiac Enzymes: No results for input(s): CKTOTAL, CKMB, CKMBINDEX, TROPONINI in the last 168 hours. BNP: BNP (last 3 results) Recent Labs    04/02/21 1626  BNP 31.3    ProBNP (last 3 results) No results for input(s): PROBNP in the last 8760 hours.  CBG: No results for input(s): GLUCAP in the last 168 hours.     Signed:  Irine Seal MD.  [facility] 04/07/2021, 12:11 PM

## 2021-04-07 NOTE — Progress Notes (Signed)
PIV removed. AVS reviewed. Patient verbalizes understanding of necessary medication regimen upon discharge. Wife at bedside for review of information as well.

## 2021-04-30 DIAGNOSIS — I2510 Atherosclerotic heart disease of native coronary artery without angina pectoris: Secondary | ICD-10-CM | POA: Diagnosis not present

## 2021-04-30 DIAGNOSIS — F17200 Nicotine dependence, unspecified, uncomplicated: Secondary | ICD-10-CM | POA: Diagnosis not present

## 2021-04-30 DIAGNOSIS — D692 Other nonthrombocytopenic purpura: Secondary | ICD-10-CM | POA: Diagnosis not present

## 2021-04-30 DIAGNOSIS — U071 COVID-19: Secondary | ICD-10-CM | POA: Diagnosis not present

## 2021-04-30 DIAGNOSIS — Z23 Encounter for immunization: Secondary | ICD-10-CM | POA: Diagnosis not present

## 2021-04-30 DIAGNOSIS — J432 Centrilobular emphysema: Secondary | ICD-10-CM | POA: Diagnosis not present

## 2021-05-23 DIAGNOSIS — N3946 Mixed incontinence: Secondary | ICD-10-CM | POA: Diagnosis not present

## 2021-05-23 DIAGNOSIS — Z8546 Personal history of malignant neoplasm of prostate: Secondary | ICD-10-CM | POA: Diagnosis not present

## 2021-05-24 ENCOUNTER — Telehealth: Payer: Self-pay | Admitting: Internal Medicine

## 2021-05-24 ENCOUNTER — Encounter: Payer: Self-pay | Admitting: Internal Medicine

## 2021-05-24 ENCOUNTER — Ambulatory Visit: Payer: Medicare PPO | Admitting: Internal Medicine

## 2021-05-24 ENCOUNTER — Other Ambulatory Visit: Payer: Self-pay

## 2021-05-24 VITALS — BP 118/70 | HR 73 | Ht 72.0 in | Wt 175.6 lb

## 2021-05-24 DIAGNOSIS — J449 Chronic obstructive pulmonary disease, unspecified: Secondary | ICD-10-CM | POA: Diagnosis not present

## 2021-05-24 DIAGNOSIS — Z720 Tobacco use: Secondary | ICD-10-CM

## 2021-05-24 DIAGNOSIS — I2510 Atherosclerotic heart disease of native coronary artery without angina pectoris: Secondary | ICD-10-CM | POA: Diagnosis not present

## 2021-05-24 DIAGNOSIS — I700 Atherosclerosis of aorta: Secondary | ICD-10-CM | POA: Diagnosis not present

## 2021-05-24 DIAGNOSIS — R0600 Dyspnea, unspecified: Secondary | ICD-10-CM

## 2021-05-24 DIAGNOSIS — E785 Hyperlipidemia, unspecified: Secondary | ICD-10-CM | POA: Diagnosis not present

## 2021-05-24 NOTE — Patient Instructions (Addendum)
Medication Instructions:  ?Your physician recommends that you continue on your current medications as directed. Please refer to the Current Medication list given to you today. ? ?*If you need a refill on your cardiac medications before your next appointment, please call your pharmacy* ? ? ?Lab Work: ?None today ? ?If you have labs (blood work) drawn today and your tests are completely normal, you will receive your results only by: ?MyChart Message (if you have MyChart) OR ?A paper copy in the mail ?If you have any lab test that is abnormal or we need to change your treatment, we will call you to review the results. ? ? ?Testing/Procedures: ?Your physician has requested that you have an echocardiogram. Echocardiography is a painless test that uses sound waves to create images of your heart. It provides your doctor with information about the size and shape of your heart and how well your heart?s chambers and valves are working. This procedure takes approximately one hour. There are no restrictions for this procedure. ? ?Your physician has requested that you have an abdominal aorta duplex. During this test, an ultrasound is used to evaluate the aorta. Allow 30 minutes for this exam. Do not eat after midnight the day before and avoid carbonated beverages ? ? ? ? ?Follow-Up: ?At [facility], you and your health needs are our priority.  As part of our continuing mission to provide you with exceptional heart care, we have created designated Provider Care Teams.  These Care Teams include your primary Cardiologist (physician) and Advanced Practice Providers (APPs -  Physician Assistants and Nurse Practitioners) who all work together to provide you with the care you need, when you need it. ? ?We recommend signing up for the patient portal called "MyChart".  Sign up information is provided on this After Visit Summary.  MyChart is used to connect with patients for Virtual Visits (Telemedicine).  Patients are able to view  lab/test results, encounter notes, upcoming appointments, etc.  Non-urgent messages can be sent to your provider as well.   ?To learn more about what you can do with MyChart, go to [email or web address].   ? ?Your next appointment:   ?3 month(s) ? ?The format for your next appointment:   ?In Person ? ?Provider:   ?Lenna Sciara MD   ? ?  ?

## 2021-05-24 NOTE — Addendum Note (Signed)
Addended by: Gar Ponto on: 05/24/2021 10:29 AM ? ? Modules accepted: Orders ? ?

## 2021-05-24 NOTE — Telephone Encounter (Signed)
Called patient and confirmed that AAA duplex is ordered for him as well as his wife.  He just wanted to make sure and is in agreement to have testing completed. ?

## 2021-05-24 NOTE — Telephone Encounter (Signed)
Patient is calling wanting to confirm Dr. Ali Lowe is wanting him to have a AAA duplex. He states he believes this is meant for his wife because it was not discussed at his appt for him, but for his wife that was seen at the same time. Please advise.  ?

## 2021-05-24 NOTE — Progress Notes (Signed)
?Cardiology Office Note:   ? ?Date:  05/24/2021  ? ?ID:  [facility], DOB 1950-06-26, MRN [id number] ? ?PCP:  [name], MD  ? ?[facility] ?Cardiologist:  Lenna Sciara, MD ?Referring MD: [name], MD  ? ?Chief Complaint/Reason for Referral: Coronary artery calcification ? ?ASSESSMENT:   ? ?Coronary artery calcification seen on CAT scan - Plan: ECHOCARDIOGRAM COMPLETE ? ?Aortic atherosclerosis ([place]) - Plan: ECHOCARDIOGRAM COMPLETE ? ?Chronic obstructive pulmonary disease, unspecified COPD type ([place]) - Plan: ECHOCARDIOGRAM COMPLETE ? ?Hyperlipidemia, unspecified hyperlipidemia type - Plan: ECHOCARDIOGRAM COMPLETE ? ?Dyspnea, unspecified type - Plan: ECHOCARDIOGRAM COMPLETE ? ?Tobacco abuse ? ?PLAN:   ? ?In order of problems listed above: ?1.  Continue anticoagulation in lieu of aspirin.  Continue statin with goal LDL less than 70.  Control blood pressure.  No further work-up unless the patient develops symptoms concerning for angina.  It may be difficult to parse out shortness of breath due to cardiac etiology from his pulmonary disease.  However shortness of breath without wheezing may be a good clue.  Follow-up in 3 months or earlier if needed. ?2.  Continue anticoagulation and lieu of aspirin and continue statin with goal LDL less than 70. ?3.  Continue current management. ?4.  Goal LDL less than 70 on statin.  His lipid panel in October was close to goal with LDL of 79. ?5.  Will check echocardiogram ?6.  Check abdominal ultrasound for AAA. ? ? ?     ? ?  ? ?Dispo:  Return in about 3 months (around 08/24/2021).  ?  ? ?Medication Adjustments/Labs and Tests Ordered: ?Current medicines are reviewed at length with the patient today.  Concerns regarding medicines are outlined above.  ? ?Tests Ordered: ?Orders Placed This Encounter  ?Procedures  ? ECHOCARDIOGRAM COMPLETE  ? ? ?Medication Changes: ?No orders of the defined types were placed in this encounter. ? ? ?History of Present Illness:    ? ?FOCUSED PROBLEM LIST:   ?1.  COPD and emphysema with ongoing tobacco abuse ?2.  Recurrent DVT on indefinite anticoagulation ?3.  Aortic and coronary artery calcification ?4.  Hyperlipidemia ? ? ?The patient is a 71 y.o. male with the indicated medical history here for recommendations regarding incidentally noted coronary artery calcification.  He has been doing fairly well.  He does get short of breath with wheezing when he exerts himself more than moderately.  He denies any exertional angina.  He has had occasional bruising while on Xarelto.  He has had no severe bleeding.  He denies any orthopnea, paroxysmal nocturnal dyspnea, or signs or symptoms of stroke.  He did contract COVID recently and recovered from this fairly well.  He is otherwise without significant complaints today. ? ?    ?  ?Previous Medical History: ?Past Medical History:  ?Diagnosis Date  ? Adenomatous colon polyp   ? Centrilobular emphysema ([place])   ? COPD (chronic obstructive pulmonary disease) ([place])   ? History of DVT of lower extremity   ? Hypercholesterolemia   ? Hypercholesterolemia   ? prostate ca dx'd 03/2012  ? surg only  ? Skin cancer   ? Tobacco dependence   ? ? ? ?Current Medications: ?Current Meds  ?Medication Sig  ? acetaminophen (TYLENOL) 325 MG tablet Take 325 mg by mouth every 6 (six) hours as needed (for pain.).  ? atorvastatin (LIPITOR) 10 MG tablet Take 10 mg by mouth daily.  ? cholecalciferol (VITAMIN D3) 10 MCG (400 UNIT) TABS tablet Take 2 tablets (800  Units total) by mouth daily.  ? fluticasone (FLONASE) 50 MCG/ACT nasal spray Place 2 sprays into both nostrils daily.  ? Ipratropium-Albuterol (COMBIVENT) 20-100 MCG/ACT AERS respimat Inhale 2 puffs into the lungs every 6 (six) hours as needed for wheezing. Use 2 puffs 3 times daily x5 days, then every 6 hours as needed.  ? latanoprost (XALATAN) 0.005 % ophthalmic solution Place 1 drop into both eyes at bedtime.  ? niacin 500 MG tablet Take 500 mg by mouth at bedtime.  ?  rivaroxaban (XARELTO) 20 MG TABS tablet Take 20 mg by mouth daily with supper.  ? vitamin C (ASCORBIC ACID) 500 MG tablet Take 500 mg by mouth daily.  ? vitamin E 180 MG (400 UNITS) capsule Take 400 Units by mouth daily.  ? zinc sulfate 220 (50 Zn) MG capsule Take 1 capsule (220 mg total) by mouth daily.  ?  ? ?Allergies:    ?No known allergies  ? ?Social History:   ?Social History  ? ?Tobacco Use  ? Smoking status: Every Day  ?  Packs/day: 1.00  ?  Years: 56.00  ?  Pack years: 56.00  ?  Types: Cigarettes  ? Smokeless tobacco: Never  ?Vaping Use  ? Vaping Use: Never used  ?Substance Use Topics  ? Alcohol use: Yes  ?  Comment: socially  ? Drug use: No  ?  ? ?Family Hx: ?Family History  ?Problem Relation Age of Onset  ? CAD Mother   ? CVA Mother   ? Cancer Father   ?     prostate cancer  ? CAD Brother   ? Cancer Brother   ?     prostate cancer   ?  ? ?Review of Systems:   ?Please see the history of present illness.    ?All other systems reviewed and are negative. ?  ? ? ?EKGs/Labs/Other Test Reviewed:   ? ?EKG: January 2023 sinus rhythm with nonspecific T wave abnormalities ? ?Prior CV studies: ?None available ? ?Imaging studies that I have independently reviewed today:  ? ?CT 12/22: ?1. Lung-RADS 2, benign appearance or behavior. Continue annual ?screening with low-dose chest CT without contrast in 12 months. ?2. Three-vessel coronary atherosclerosis. ?3. Stable left adrenal adenoma. ?4. Aortic Atherosclerosis (ICD10-I70.0) and Emphysema (ICD10-J43.9). ? ?Recent Labs: ?04/02/2021: B Natriuretic Peptide 31.3 ?04/07/2021: ALT 22; BUN 15; Creatinine, Ser 0.83; Hemoglobin 14.6; Magnesium 2.1; Platelets 209; Potassium 3.6; Sodium 138  ? ?Recent Lipid Panel ?Outside labs demonstrate cholesterol 142 triglycerides 106 HDL 43 and LDL 79 from October 2022 with normal LFTs and creatinine 0.91 ? ?Risk Assessment/Calculations:   ? ?  ?    ? ?Physical Exam:   ? ?VS:  BP 118/70   Pulse 73   Ht 6' (1.829 m)   Wt 175 lb 9.6 oz (79.7  kg)   SpO2 97%   BMI 23.82 kg/m?    ?Wt Readings from Last 3 Encounters:  ?05/24/21 175 lb 9.6 oz (79.7 kg)  ?04/07/21 170 lb 6.7 oz (77.3 kg)  ?09/27/16 191 lb 1.6 oz (86.7 kg)  ?  ?GENERAL:  No apparent distress, AOx3 ?HEENT:  No carotid bruits, +2 carotid impulses, no scleral icterus ?CAR: RRR no murmurs, gallops, rubs, or thrills ?RES:  Clear to auscultation bilaterally ?ABD:  Soft, nontender, nondistended, positive bowel sounds x 4 ?VASC:  +2 radial pulses, +2 carotid pulses, palpable pedal pulses ?NEURO:  CN 2-12 grossly intact; motor and sensory grossly intact ?PSYCH:  No active depression or anxiety ?EXT:  No edema, ecchymosis, or cyanosis ? ?Signed, ?Early Osmond, MD  ?05/24/2021 9:20 AM    ?[place] ?[place], [place],   [address] ?Phone: [phone]; Fax: [phone]  ? ?Note:  This document was prepared using Dragon voice recognition software and may include unintentional dictation errors. ?

## 2021-06-04 ENCOUNTER — Ambulatory Visit (HOSPITAL_COMMUNITY): Payer: Medicare PPO | Attending: Internal Medicine

## 2021-06-04 ENCOUNTER — Other Ambulatory Visit: Payer: Self-pay

## 2021-06-04 DIAGNOSIS — R0600 Dyspnea, unspecified: Secondary | ICD-10-CM | POA: Diagnosis not present

## 2021-06-04 DIAGNOSIS — J449 Chronic obstructive pulmonary disease, unspecified: Secondary | ICD-10-CM | POA: Diagnosis not present

## 2021-06-04 DIAGNOSIS — E785 Hyperlipidemia, unspecified: Secondary | ICD-10-CM

## 2021-06-04 DIAGNOSIS — I2510 Atherosclerotic heart disease of native coronary artery without angina pectoris: Secondary | ICD-10-CM | POA: Diagnosis not present

## 2021-06-04 DIAGNOSIS — R0609 Other forms of dyspnea: Secondary | ICD-10-CM

## 2021-06-04 DIAGNOSIS — I700 Atherosclerosis of aorta: Secondary | ICD-10-CM | POA: Diagnosis not present

## 2021-06-04 LAB — ECHOCARDIOGRAM COMPLETE
AR max vel: 2.53 cm2
AV Area VTI: 2.44 cm2
AV Area mean vel: 2.24 cm2
AV Mean grad: 4 mmHg
AV Peak grad: 7.6 mmHg
Ao pk vel: 1.38 m/s
Area-P 1/2: 2.89 cm2
S' Lateral: 3.1 cm

## 2021-06-14 ENCOUNTER — Other Ambulatory Visit: Payer: Self-pay | Admitting: Internal Medicine

## 2021-06-14 ENCOUNTER — Ambulatory Visit (HOSPITAL_COMMUNITY)
Admission: RE | Admit: 2021-06-14 | Discharge: 2021-06-14 | Disposition: A | Discharge status: Home / Self Care | Payer: Medicare PPO | Source: Ambulatory Visit | Attending: Cardiology | Admitting: Cardiology

## 2021-06-14 DIAGNOSIS — I700 Atherosclerosis of aorta: Secondary | ICD-10-CM | POA: Insufficient documentation

## 2021-06-14 DIAGNOSIS — R0600 Dyspnea, unspecified: Secondary | ICD-10-CM | POA: Diagnosis not present

## 2021-06-14 DIAGNOSIS — J449 Chronic obstructive pulmonary disease, unspecified: Secondary | ICD-10-CM | POA: Diagnosis not present

## 2021-06-14 DIAGNOSIS — I2510 Atherosclerotic heart disease of native coronary artery without angina pectoris: Secondary | ICD-10-CM | POA: Diagnosis not present

## 2021-06-14 DIAGNOSIS — Z720 Tobacco use: Secondary | ICD-10-CM | POA: Insufficient documentation

## 2021-06-14 DIAGNOSIS — E785 Hyperlipidemia, unspecified: Secondary | ICD-10-CM | POA: Insufficient documentation

## 2021-07-16 DIAGNOSIS — H401131 Primary open-angle glaucoma, bilateral, mild stage: Secondary | ICD-10-CM | POA: Diagnosis not present

## 2021-07-16 DIAGNOSIS — H2513 Age-related nuclear cataract, bilateral: Secondary | ICD-10-CM | POA: Diagnosis not present

## 2021-07-18 DIAGNOSIS — L821 Other seborrheic keratosis: Secondary | ICD-10-CM | POA: Diagnosis not present

## 2021-07-18 DIAGNOSIS — L309 Dermatitis, unspecified: Secondary | ICD-10-CM | POA: Diagnosis not present

## 2021-07-18 DIAGNOSIS — Z85828 Personal history of other malignant neoplasm of skin: Secondary | ICD-10-CM | POA: Diagnosis not present

## 2021-07-18 DIAGNOSIS — L814 Other melanin hyperpigmentation: Secondary | ICD-10-CM | POA: Diagnosis not present

## 2021-07-18 DIAGNOSIS — Z08 Encounter for follow-up examination after completed treatment for malignant neoplasm: Secondary | ICD-10-CM | POA: Diagnosis not present

## 2021-07-18 DIAGNOSIS — D225 Melanocytic nevi of trunk: Secondary | ICD-10-CM | POA: Diagnosis not present

## 2021-09-10 NOTE — Progress Notes (Signed)
Cardiology Office Note:    Date:  09/13/2021   ID:  [facility], DOB [birth year], MRN [id number]  PCP:  [name], MD   [facility] Providers Cardiologist:  Lenna Sciara, MD Referring MD: [name], MD   Chief Complaint/Reason for Referral: Coronary artery calcification  ASSESSMENT:    Coronary artery calcification seen on CAT scan  Aortic atherosclerosis ([place]) - Plan: Lipid panel, Hepatic function panel, Lipoprotein A (LPA)  Chronic obstructive pulmonary disease, unspecified COPD type ([place])  Hyperlipidemia, unspecified hyperlipidemia type - Plan: Lipid panel, Hepatic function panel, Lipoprotein A (LPA)  Tobacco abuse  PLAN:    In order of problems listed above: 1.  Coronary artery calcification: Continue anticoagulation in lieu of aspirin.  Continue statin with goal LDL less than 70.  Control blood pressure.  Continue anticoagulation and lieu of aspirin and continue statin with goal LDL less than 70.  Follow-up in 9 months or earlier if needed. 2.  Aortic atherosclerosis: Continue current management. 3.  COPD: Continue current therapy.  Goal LDL less than 70 on statin.  His lipid panel in October was close to goal with LDL of 79. 5.  Hyperlipidemia: Goal LDL is less than 70.  We will check lipid panel, LFTs, LP(a)  6.  Tobacco abuse: Abdominal ultrasound was reassuring.  He has no symptoms of claudication.   Dispo:  Return in about 9 months (around 06/14/2022).     Medication Adjustments/Labs and Tests Ordered: Current medicines are reviewed at length with the patient today.  Concerns regarding medicines are outlined above.   Tests Ordered: Orders Placed This Encounter  Procedures   Lipid panel   Hepatic function panel   Lipoprotein A (LPA)    Medication Changes: No orders of the defined types were placed in this encounter.   History of Present Illness:    FOCUSED PROBLEM LIST:   1.  COPD and emphysema with ongoing tobacco abuse 2.   Recurrent DVT on indefinite anticoagulation 3.  Aortic and coronary artery calcification 4.  Hyperlipidemia 5.  Tobacco abuse  March 2023: Patient seen for initial consultation regarding incidentally noted coronary artery calcification.  He was without chest pain and so no further changes were made to his medical regimen.  He was referred for abdominal aortic aneurysm screening ultrasound which was unremarkable.  Today: The patient remains angina free.  Is able to walk without claudication symptoms.  His biggest issue is a continual cough.  He does not wheeze.  He is required no emergency room visits or hospitalizations recently for breathing issues or chest pain.  He has had no severe bleeding or bruising while on anticoagulation.  He otherwise feels well and is looking forward to going to the beach this summer.      Current Medications: Current Meds  Medication Sig   acetaminophen (TYLENOL) 325 MG tablet Take 325 mg by mouth every 6 (six) hours as needed (for pain.).   atorvastatin (LIPITOR) 10 MG tablet Take 10 mg by mouth daily.   fluticasone (FLONASE) 50 MCG/ACT nasal spray Place 2 sprays into both nostrils daily.   Ipratropium-Albuterol (COMBIVENT) 20-100 MCG/ACT AERS respimat Inhale 2 puffs into the lungs every 6 (six) hours as needed for wheezing. Use 2 puffs 3 times daily x5 days, then every 6 hours as needed.   latanoprost (XALATAN) 0.005 % ophthalmic solution Place 1 drop into both eyes at bedtime.   niacin 500 MG tablet Take 500 mg by mouth at bedtime.   rivaroxaban (XARELTO) 20  MG TABS tablet Take 20 mg by mouth daily with supper.   vitamin C (ASCORBIC ACID) 500 MG tablet Take 500 mg by mouth daily.   vitamin E 180 MG (400 UNITS) capsule Take 400 Units by mouth daily.     Allergies:    No known allergies   Social History:   Social History   Tobacco Use   Smoking status: Every Day    Packs/day: 1.00    Years: 56.00    Total pack years: 56.00    Types: Cigarettes    Smokeless tobacco: Never  Vaping Use   Vaping Use: Never used  Substance Use Topics   Alcohol use: Yes    Comment: socially   Drug use: No     Family Hx: Family History  Problem Relation Age of Onset   CAD Mother    CVA Mother    Cancer Father        prostate cancer   CAD Brother    Cancer Brother        prostate cancer      Review of Systems:   Please see the history of present illness.    All other systems reviewed and are negative.     EKGs/Labs/Other Test Reviewed:    EKG: January 2023 sinus rhythm with nonspecific T wave abnormalities  Prior CV studies: None available  Imaging studies that I have independently reviewed today:   TTE 2023 demonstrates ejection fraction of 60 to 65% with mild concentric left ventricular hypertrophy with aortic valve sclerosis and no significant other valvular abnormalities.  Abdominal ultrasound 2023 without aortic aneurysm  CT 2022 three-vessel coronary atherosclerosis and aortic atherosclerosis  Recent Labs: 04/02/2021: B Natriuretic Peptide 31.3 04/07/2021: ALT 22; BUN 15; Creatinine, Ser 0.83; Hemoglobin 14.6; Magnesium 2.1; Platelets 209; Potassium 3.6; Sodium 138   Recent Lipid Panel Outside labs demonstrate cholesterol 142 triglycerides 106 HDL 43 and LDL 79 from October 2022 with normal LFTs and creatinine 0.91  Risk Assessment/Calculations:           Physical Exam:    VS:  BP 120/70   Pulse 71   Ht 6' (1.829 m)   Wt 172 lb 9.6 oz (78.3 kg)   SpO2 95%   BMI 23.41 kg/m    Wt Readings from Last 3 Encounters:  09/13/21 172 lb 9.6 oz (78.3 kg)  05/24/21 175 lb 9.6 oz (79.7 kg)  04/07/21 170 lb 6.7 oz (77.3 kg)    GENERAL:  No apparent distress, AOx3 HEENT:  No carotid bruits, +2 carotid impulses, no scleral icterus CAR: RRR no murmurs, gallops, rubs, or thrills RES:  Clear to auscultation bilaterally ABD:  Soft, nontender, nondistended, positive bowel sounds x 4 VASC:  +2 radial pulses, +2 carotid pulses,  palpable pedal pulses NEURO:  CN 2-12 grossly intact; motor and sensory grossly intact PSYCH:  No active depression or anxiety EXT:  No edema, ecchymosis, or cyanosis  Signed, Early Osmond, MD  09/13/2021 10:37 AM    [address], [place], [place]  [address] Phone: [phone]; Fax: [phone]   Note:  This document was prepared using Dragon voice recognition software and may include unintentional dictation errors.

## 2021-09-13 ENCOUNTER — Ambulatory Visit: Payer: Medicare PPO | Admitting: Internal Medicine

## 2021-09-13 ENCOUNTER — Encounter: Payer: Self-pay | Admitting: Internal Medicine

## 2021-09-13 VITALS — BP 120/70 | HR 71 | Ht 72.0 in | Wt 172.6 lb

## 2021-09-13 DIAGNOSIS — I700 Atherosclerosis of aorta: Secondary | ICD-10-CM

## 2021-09-13 DIAGNOSIS — E785 Hyperlipidemia, unspecified: Secondary | ICD-10-CM

## 2021-09-13 DIAGNOSIS — Z720 Tobacco use: Secondary | ICD-10-CM | POA: Diagnosis not present

## 2021-09-13 DIAGNOSIS — I2510 Atherosclerotic heart disease of native coronary artery without angina pectoris: Secondary | ICD-10-CM | POA: Diagnosis not present

## 2021-09-13 DIAGNOSIS — J449 Chronic obstructive pulmonary disease, unspecified: Secondary | ICD-10-CM

## 2021-09-13 NOTE — Patient Instructions (Signed)
Medication Instructions:  Your physician recommends that you continue on your current medications as directed. Please refer to the Current Medication list given to you today.  *If you need a refill on your cardiac medications before your next appointment, please call your pharmacy*   Lab Work: Lipids, LFT, LAP If you have labs (blood work) drawn today and your tests are completely normal, you will receive your results only by: Andrew (if you have MyChart) OR A paper copy in the mail If you have any lab test that is abnormal or we need to change your treatment, we will call you to review the results.   Follow-Up: At [facility], you and your health needs are our priority.  As part of our continuing mission to provide you with exceptional heart care, we have created designated Provider Care Teams.  These Care Teams include your primary Cardiologist (physician) and Advanced Practice Providers (APPs -  Physician Assistants and Nurse Practitioners) who all work together to provide you with the care you need, when you need it.   Your next appointment:   9 months   The format for your next appointment:   In Person  Provider:   Early Osmond, MD {      Important Information About Sugar

## 2021-09-14 ENCOUNTER — Telehealth: Payer: Self-pay

## 2021-09-14 DIAGNOSIS — E785 Hyperlipidemia, unspecified: Secondary | ICD-10-CM

## 2021-09-14 LAB — HEPATIC FUNCTION PANEL
ALT: 11 IU/L (ref 0–44)
AST: 16 IU/L (ref 0–40)
Albumin: 4.1 g/dL (ref 3.7–4.7)
Alkaline Phosphatase: 53 IU/L (ref 44–121)
Bilirubin Total: 0.4 mg/dL (ref 0.0–1.2)
Bilirubin, Direct: 0.11 mg/dL (ref 0.00–0.40)
Total Protein: 6 g/dL (ref 6.0–8.5)

## 2021-09-14 LAB — LIPID PANEL
Chol/HDL Ratio: 2.8 ratio (ref 0.0–5.0)
Cholesterol, Total: 132 mg/dL (ref 100–199)
HDL: 48 mg/dL (ref >39)
LDL Chol Calc (NIH): 72 mg/dL (ref 0–99)
Triglycerides: 55 mg/dL (ref 0–149)
VLDL Cholesterol Cal: 12 mg/dL (ref 5–40)

## 2021-09-14 LAB — LIPOPROTEIN A (LPA): Lipoprotein (a): 118.8 nmol/L — ABNORMAL HIGH (ref <75.0)

## 2021-09-14 NOTE — Telephone Encounter (Signed)
-----   Message from Early Osmond, MD sent at 09/14/2021 12:43 PM EDT ----- Please refer to Dr. Debara Pickett re elevated Lp(a)

## 2021-09-14 NOTE — Telephone Encounter (Signed)
Left message for patient to call back or review results/recommendations in MyChart. Referral has been placed.

## 2022-01-04 DIAGNOSIS — Z0000 Encounter for general adult medical examination without abnormal findings: Secondary | ICD-10-CM | POA: Diagnosis not present

## 2022-01-04 DIAGNOSIS — Z79899 Other long term (current) drug therapy: Secondary | ICD-10-CM | POA: Diagnosis not present

## 2022-01-04 DIAGNOSIS — Z86718 Personal history of other venous thrombosis and embolism: Secondary | ICD-10-CM | POA: Diagnosis not present

## 2022-01-04 DIAGNOSIS — E7800 Pure hypercholesterolemia, unspecified: Secondary | ICD-10-CM | POA: Diagnosis not present

## 2022-01-04 DIAGNOSIS — Z1331 Encounter for screening for depression: Secondary | ICD-10-CM | POA: Diagnosis not present

## 2022-01-04 DIAGNOSIS — Z8546 Personal history of malignant neoplasm of prostate: Secondary | ICD-10-CM | POA: Diagnosis not present

## 2022-01-04 DIAGNOSIS — J439 Emphysema, unspecified: Secondary | ICD-10-CM | POA: Diagnosis not present

## 2022-01-04 DIAGNOSIS — I700 Atherosclerosis of aorta: Secondary | ICD-10-CM | POA: Diagnosis not present

## 2022-01-04 DIAGNOSIS — Z23 Encounter for immunization: Secondary | ICD-10-CM | POA: Diagnosis not present

## 2022-01-23 DIAGNOSIS — H2513 Age-related nuclear cataract, bilateral: Secondary | ICD-10-CM | POA: Diagnosis not present

## 2022-01-23 DIAGNOSIS — H401132 Primary open-angle glaucoma, bilateral, moderate stage: Secondary | ICD-10-CM | POA: Diagnosis not present

## 2022-01-23 DIAGNOSIS — H35371 Puckering of macula, right eye: Secondary | ICD-10-CM | POA: Diagnosis not present

## 2022-01-31 ENCOUNTER — Ambulatory Visit: Payer: Medicare PPO | Admitting: Pulmonary Disease

## 2022-01-31 ENCOUNTER — Encounter: Payer: Self-pay | Admitting: Pulmonary Disease

## 2022-01-31 VITALS — BP 132/62 | HR 61 | Temp 98.0°F | Ht 72.0 in | Wt 169.2 lb

## 2022-01-31 DIAGNOSIS — J431 Panlobular emphysema: Secondary | ICD-10-CM | POA: Diagnosis not present

## 2022-01-31 DIAGNOSIS — F17210 Nicotine dependence, cigarettes, uncomplicated: Secondary | ICD-10-CM | POA: Diagnosis not present

## 2022-01-31 MED ORDER — BREZTRI AEROSPHERE 160-9-4.8 MCG/ACT IN AERO: 2.0000 | INHALATION_SPRAY | Freq: Two times a day (BID) | RESPIRATORY_TRACT | 0 refills | Status: DC

## 2022-01-31 NOTE — Progress Notes (Signed)
[facility]    [id number]    Dec 15, 1950  Primary Care Physician:Ehinger, Herbie [place], MD  Referring Physician: [name], MD [address] [place],  [place] [id number]  Chief complaint:   Patient being seen for shortness of breath  HPI:  Patient with known chronic obstructive pulmonary disease Had a CT scan in January showing extensive emphysema  56-pack-year smoking history  [facility] is bothered by a lot of mucus production  Is short of breath with moderate activity worse in the last couple years He did note significant worsening following having COVID  Walking up steps makes it worse Stopping and resting makes it better Denies any chest pains or chest discomfort  Persistent mucus production clear mucus  History of blood clots History of prostate cancer    Outpatient Encounter Medications as of 01/31/2022  Medication Sig   acetaminophen (TYLENOL) 325 MG tablet Take 325 mg by mouth every 6 (six) hours as needed (for pain.).   atorvastatin (LIPITOR) 10 MG tablet Take 10 mg by mouth daily.   fluticasone (FLONASE) 50 MCG/ACT nasal spray Place 2 sprays into both nostrils daily.   latanoprost (XALATAN) 0.005 % ophthalmic solution Place 1 drop into both eyes at bedtime.   niacin 500 MG tablet Take 500 mg by mouth at bedtime.   rivaroxaban (XARELTO) 20 MG TABS tablet Take 20 mg by mouth daily with supper.   vitamin C (ASCORBIC ACID) 500 MG tablet Take 500 mg by mouth daily.   vitamin E 180 MG (400 UNITS) capsule Take 400 Units by mouth daily.   Ipratropium-Albuterol (COMBIVENT) 20-100 MCG/ACT AERS respimat Inhale 2 puffs into the lungs every 6 (six) hours as needed for wheezing. Use 2 puffs 3 times daily x5 days, then every 6 hours as needed. (Patient taking differently: Inhale 1 puff into the lungs at bedtime.)   No facility-administered encounter medications on file as of 01/31/2022.    Allergies as of 01/31/2022 - Review Complete 01/31/2022   Allergen Reaction Noted   No known allergies  05/21/2016    Past Medical History:  Diagnosis Date   Adenomatous colon polyp    Centrilobular emphysema ([facility])    COPD (chronic obstructive pulmonary disease) ([facility])    History of DVT of lower extremity    Hypercholesterolemia    Hypercholesterolemia    prostate ca dx'd 03/2012   surg only   Skin cancer    Tobacco dependence     Past Surgical History:  Procedure Laterality Date   APPENDECTOMY     June 14, 2015   COLONOSCOPY  1/14   INSERTION OF MESH N/A 05/22/2016   Procedure: INSERTION OF MESH;  Surgeon: Clovis Riley, MD;  Location: [facility];  Service: General;  Laterality: N/A;   LYMPHADENECTOMY Bilateral 07/09/2012   Procedure: [facility];  Surgeon: [place] Gray, MD;  Location: [facility];  Service: Urology;  Laterality: Bilateral;   ROBOT ASSISTED LAPAROSCOPIC RADICAL PROSTATECTOMY N/A 07/09/2012   Procedure: ROBOTIC ASSISTED LAPAROSCOPIC RADICAL PROSTATECTOMY LEVEL 2;  Surgeon: [place] Gray, MD;  Location: [facility];  Service: Urology;  Laterality: N/A;   TONSILLECTOMY     VENTRAL HERNIA REPAIR N/A 05/22/2016   Procedure: LAPAROSCOPIC VENTRAL HERNIA REPAIR WITH MESH;  Surgeon: Clovis Riley, MD;  Location: [facility];  Service: General;  Laterality: N/A;    Family History  Problem Relation Age of Onset   CAD Mother    CVA Mother    Cancer Father  prostate cancer   CAD Brother    Cancer Brother        prostate cancer     Social History   Socioeconomic History   Marital status: Married    Spouse name: Not on file   Number of children: Not on file   Years of education: Not on file   Highest education level: Not on file  Occupational History   Not on file  Tobacco Use   Smoking status: Every Day    Packs/day: 1.00    Years: 56.00    Total pack years: 56.00    Types: Cigarettes   Smokeless tobacco: Never  Vaping Use   Vaping Use: Never used  Substance and Sexual Activity   Alcohol use: Yes    Comment: socially    Drug use: No   Sexual activity: Not Currently  Other Topics Concern   Not on file  Social History Narrative   Not on file   Social Determinants of Health   Financial Resource Strain: Not on file  Food Insecurity: Not on file  Transportation Needs: Not on file  Physical Activity: Not on file  Stress: Not on file  Social Connections: Not on file  Intimate Partner Violence: Not on file    Review of Systems  Respiratory:  Positive for cough and shortness of breath.     Vitals:   01/31/22 1333  BP: 132/62  Pulse: 61  Temp: 98 F (36.7 C)  SpO2: 96%     Physical Exam Constitutional:      Appearance: Normal appearance.  HENT:     Head: Normocephalic.     Mouth/Throat:     Mouth: Mucous membranes are moist.  Eyes:     Conjunctiva/sclera: Conjunctivae normal.  Cardiovascular:     Rate and Rhythm: Normal rate and regular rhythm.     Heart sounds: No murmur heard.    No friction rub.  Pulmonary:     Effort: No respiratory distress.     Breath sounds: No stridor. No wheezing or rhonchi.  Musculoskeletal:     Cervical back: No rigidity or tenderness.  Neurological:     Mental Status: He is alert.  Psychiatric:        Mood and Affect: Mood normal.     Data Reviewed: CT scan of the chest from 04/02/2021 reviewed with the patient showing extensive emphysema, some scarring right upper lobe  Assessment:  Advanced chronic obstructive pulmonary disease  Shortness of breath on exertion  Excessive mucus production  Active smoker   Plan/Recommendations: Smoking cessation counseling  Graded exercise as tolerated  Schedule for pulmonary function test  Prescription for Breztri provided, samples provided  I will see him back in about 6 weeks   Sherrilyn Rist MD [facility] 01/31/2022, 2:16 PM  CC: [facility], MD

## 2022-01-31 NOTE — Addendum Note (Signed)
Addended byOralia Rud M on: 01/31/2022 03:23 PM   Modules accepted: Orders

## 2022-01-31 NOTE — Addendum Note (Signed)
Addended byOralia Rud M on: 01/31/2022 04:22 PM   Modules accepted: Orders

## 2022-01-31 NOTE — Patient Instructions (Signed)
Prescription for [facility] will be sent to pharmacy  We will provide you with a sample of Breztri  Use medication as we reviewed in the office today  It is 2 puffs twice a day  Work on quitting smoking  Graded exercise as tolerated  We will get a breathing study on you at the next visit in about 6 weeks  Call with significant concerns

## 2022-02-04 ENCOUNTER — Telehealth: Payer: Self-pay | Admitting: Pulmonary Disease

## 2022-02-04 MED ORDER — BREZTRI AEROSPHERE 160-9-4.8 MCG/ACT IN AERO: 2.0000 | INHALATION_SPRAY | Freq: Two times a day (BID) | RESPIRATORY_TRACT | 0 refills | Status: DC

## 2022-02-04 NOTE — Telephone Encounter (Signed)
Called Pt and he states that he needs a refill on his Breztri sent to [facility] on [address]. Nothing further needed.

## 2022-02-11 ENCOUNTER — Telehealth: Payer: Self-pay | Admitting: Pulmonary Disease

## 2022-02-11 MED ORDER — BREZTRI AEROSPHERE 160-9-4.8 MCG/ACT IN AERO: 2.0000 | INHALATION_SPRAY | Freq: Two times a day (BID) | RESPIRATORY_TRACT | 5 refills | Status: DC

## 2022-02-11 NOTE — Telephone Encounter (Signed)
Called and spoke with patient. He stated that the Judithann Sauger was never sent in on 11/20. I reviewed his chart and it appears the order was placed as a sample. I advised him that I would send the RX for him. He verbalized understanding.   Nothing further needed at time of call.

## 2022-02-15 ENCOUNTER — Ambulatory Visit (HOSPITAL_BASED_OUTPATIENT_CLINIC_OR_DEPARTMENT_OTHER): Payer: Medicare PPO | Admitting: Internal Medicine

## 2022-02-20 ENCOUNTER — Telehealth: Payer: Self-pay | Admitting: Pulmonary Disease

## 2022-02-20 MED ORDER — BREZTRI AEROSPHERE 160-9-4.8 MCG/ACT IN AERO: 2.0000 | INHALATION_SPRAY | Freq: Two times a day (BID) | RESPIRATORY_TRACT | 5 refills | Status: DC

## 2022-02-20 NOTE — Telephone Encounter (Signed)
Rx for pt's Adam Gilmore had been placed as a sample when trying to send directly to the pharmacy. I have fixed the Rx so it can be electronically sent to the pharmacy. Called and spoke with pt letting him know this had been done and he verbalized understanding. Nothing further needed.

## 2022-03-05 ENCOUNTER — Ambulatory Visit (HOSPITAL_BASED_OUTPATIENT_CLINIC_OR_DEPARTMENT_OTHER): Payer: Medicare PPO | Admitting: Internal Medicine

## 2022-03-05 ENCOUNTER — Ambulatory Visit
Admission: RE | Admit: 2022-03-05 | Discharge: 2022-03-05 | Disposition: A | Discharge status: Home / Self Care | Payer: Medicare PPO | Source: Ambulatory Visit | Attending: Acute Care | Admitting: Acute Care

## 2022-03-05 ENCOUNTER — Encounter (HOSPITAL_BASED_OUTPATIENT_CLINIC_OR_DEPARTMENT_OTHER): Payer: Self-pay | Admitting: Internal Medicine

## 2022-03-05 VITALS — BP 136/82 | HR 75 | Ht 72.0 in | Wt 171.6 lb

## 2022-03-05 DIAGNOSIS — I2510 Atherosclerotic heart disease of native coronary artery without angina pectoris: Secondary | ICD-10-CM

## 2022-03-05 DIAGNOSIS — Z87891 Personal history of nicotine dependence: Secondary | ICD-10-CM

## 2022-03-05 DIAGNOSIS — F17210 Nicotine dependence, cigarettes, uncomplicated: Secondary | ICD-10-CM

## 2022-03-05 DIAGNOSIS — E785 Hyperlipidemia, unspecified: Secondary | ICD-10-CM | POA: Diagnosis not present

## 2022-03-05 DIAGNOSIS — R1032 Left lower quadrant pain: Secondary | ICD-10-CM

## 2022-03-05 DIAGNOSIS — E7841 Elevated Lipoprotein(a): Secondary | ICD-10-CM

## 2022-03-05 MED ORDER — ATORVASTATIN CALCIUM 20 MG PO TABS: 20.0000 mg | ORAL_TABLET | Freq: Every day | ORAL | 3 refills | Status: DC

## 2022-03-05 NOTE — Progress Notes (Signed)
LIPID CLINIC CONSULT NOTE  Chief Complaint:  Elevated LP(a)  Primary Care Physician: [name], MD  Primary Cardiologist:  Early Osmond, MD  HPI:  Adam Gilmore is a 71 y.o. male who is being seen today for the evaluation of elevated LP(a) at the request of Thukkani, Arlie Solomons, MD. is a pleasant 71 year old male kindly referred by Dr. Ali Lowe for elevated LP(a).  He was initially seen for coronary calcification that was seen on pulmonary CT.  He did not have a dedicated calcium score.  He has been on 10 mg of atorvastatin with recent lipid profile showing total cholesterol 132, triglycerides 58, HDL 48 and LDL 72.  His LP(a) was elevated at 118 nmol/L.  He has no coronary history of the events, but specifically no chest pain or prior MI or stroke.  He does have a history of COPD and smoking and is anticoagulated on Xarelto due to history of "blood clots".  He also has a history of hernia repair.  He reported to me today that he felt like he had a pain on the left peri-inguinal area which happened last week but has improved somewhat.  He was concerned about recurrent hernia.  He did undergo laparoscopic ventral hernia repair with mesh by Dr. Windle Guard in 2018.  This was for an incisional hernia that was visible superior to the umbilicus.  PMHx:  Past Medical History:  Diagnosis Date   Adenomatous colon polyp    Centrilobular emphysema (HCC)    COPD (chronic obstructive pulmonary disease) ([place])    History of DVT of lower extremity    Hypercholesterolemia    Hypercholesterolemia    prostate ca dx'd 03/2012   surg only   Skin cancer    Tobacco dependence     Past Surgical History:  Procedure Laterality Date   APPENDECTOMY     June 14, 2015   COLONOSCOPY  1/14   INSERTION OF MESH N/A 05/22/2016   Procedure: INSERTION OF MESH;  Surgeon: Clovis Riley, MD;  Location: [facility];  Service: General;  Laterality: N/A;   LYMPHADENECTOMY Bilateral 07/09/2012   Procedure:  Noel Journey;  Surgeon: Dutch Gray, MD;  Location: [facility];  Service: Urology;  Laterality: Bilateral;   ROBOT ASSISTED LAPAROSCOPIC RADICAL PROSTATECTOMY N/A 07/09/2012   Procedure: ROBOTIC ASSISTED LAPAROSCOPIC RADICAL PROSTATECTOMY LEVEL 2;  Surgeon: Dutch Gray, MD;  Location: [facility];  Service: Urology;  Laterality: N/A;   TONSILLECTOMY     VENTRAL HERNIA REPAIR N/A 05/22/2016   Procedure: LAPAROSCOPIC VENTRAL HERNIA REPAIR WITH MESH;  Surgeon: Clovis Riley, MD;  Location: [facility];  Service: General;  Laterality: N/A;    FAMHx:  Family History  Problem Relation Age of Onset   CAD Mother    CVA Mother    Cancer Father        prostate cancer   CAD Brother    Cancer Brother        prostate cancer     SOCHx:   reports that he has been smoking cigarettes. He has a 56.00 pack-year smoking history. He has never used smokeless tobacco. He reports current alcohol use. He reports that he does not use drugs.  ALLERGIES:  Allergies  Allergen Reactions   No Known Allergies     ROS: Pertinent items noted in HPI and remainder of comprehensive ROS otherwise negative.  HOME MEDS: Current Outpatient Medications on File Prior to Visit  Medication Sig Dispense Refill   acetaminophen (TYLENOL) 325 MG tablet  Take 325 mg by mouth every 6 (six) hours as needed (for pain.).     Budeson-Glycopyrrol-Formoterol (BREZTRI AEROSPHERE) 160-9-4.8 MCG/ACT AERO Inhale 2 puffs into the lungs in the morning and at bedtime. 10.7 g 5   fluticasone (FLONASE) 50 MCG/ACT nasal spray Place 2 sprays into both nostrils daily. 11.1 mL 0   latanoprost (XALATAN) 0.005 % ophthalmic solution Place 1 drop into both eyes at bedtime.  5   niacin 500 MG tablet Take 500 mg by mouth at bedtime.     rivaroxaban (XARELTO) 20 MG TABS tablet Take 20 mg by mouth daily with supper.     vitamin C (ASCORBIC ACID) 500 MG tablet Take 500 mg by mouth daily.     vitamin E 180 MG (400 UNITS) capsule Take 400 Units by mouth daily.     No  current facility-administered medications on file prior to visit.    LABS/IMAGING: No results found for this or any previous visit (from the past 48 hour(s)). No results found.  LIPID PANEL:    Component Value Date/Time   CHOL 132 09/13/2021 1038   TRIG 55 09/13/2021 1038   HDL 48 09/13/2021 1038   CHOLHDL 2.8 09/13/2021 1038   LDLCALC 72 09/13/2021 1038    WEIGHTS: Wt Readings from Last 3 Encounters:  03/05/22 171 lb 9.6 oz (77.8 kg)  01/31/22 169 lb 3.2 oz (76.7 kg)  09/13/21 172 lb 9.6 oz (78.3 kg)    VITALS: BP 136/82   Pulse 75   Ht 6' (1.829 m)   Wt 171 lb 9.6 oz (77.8 kg)   BMI 23.27 kg/m   EXAM: General appearance: alert and no distress Abdomen: Focused physical exam of the left superior inguinal area notable for mild bulging but no palpable bowel  EKG: Deferred  ASSESSMENT: Mixed dyslipidemia, goal LDL less than 70 Coronary artery calcification seen on CT scan Elevated LP(a) at 118 nmol/L Left inguinal area pain - prior mesh hernia repair  PLAN: 1.   Mr. Bise has a mixed dyslipidemia and is above a target LDL less than 70.  I would advise an increase in his atorvastatin from 10 to 20 mg daily.  He should have repeat lipids in 3 to 4 months.  As he has had no clinical cardiovascular disease and his LP(a) is only minimally elevated, it is a difficult argument to consider adding a PCSK9 inhibitor at this point.  We did weigh the risks and benefits of this and he would prefer just to optimize statin therapy.  He had some concerns about left area inguinal pain and I did examine it without clear evidence of hernia.  He did have some mild bulging but this was above the inguinal crease.  Previously he had a periumbilical hernia which was repaired in 2018 with a mesh.  He says his pain has improved.  I suspect this was just a strain.  If it persist, he should follow-up with his surgeon or primary care provider.  Thanks again for the kind referral.  Pixie Casino, MD, FACC, [place] Director of the [facility]  [facility] Diplomate of the American Board of Clinical Lipidology Attending Cardiologist  Direct Dial: [phone]  Fax: [phone]  Website:  [email or web address] 03/05/2022, 9:46 AM

## 2022-03-05 NOTE — Patient Instructions (Signed)
Medication Instructions:  INCREASE atorvastatin to '20mg'$  daily   *If you need a refill on your cardiac medications before your next appointment, please call your pharmacy*   Lab Work: FASTING lab work to check cholesterol in about 3-4 months    If you have labs (blood work) drawn today and your tests are completely normal, you will receive your results only by: [place] (if you have MyChart) OR A paper copy in the mail If you have any lab test that is abnormal or we need to change your treatment, we will call you to review the results.  Follow-Up: At [facility], you and your health needs are our priority.  As part of our continuing mission to provide you with exceptional heart care, we have created designated Provider Care Teams.  These Care Teams include your primary Cardiologist (physician) and Advanced Practice Providers (APPs -  Physician Assistants and Nurse Practitioners) who all work together to provide you with the care you need, when you need it.  We recommend signing up for the patient portal called "MyChart".  Sign up information is provided on this After Visit Summary.  MyChart is used to connect with patients for Virtual Visits (Telemedicine).  Patients are able to view lab/test results, encounter notes, upcoming appointments, etc.  Non-urgent messages can be sent to your provider as well.   To learn more about what you can do with MyChart, go to [email or web address].    Your next appointment:    AS NEEDED with Dr. Debara Pickett

## 2022-03-12 ENCOUNTER — Other Ambulatory Visit: Payer: Self-pay | Admitting: Acute Care

## 2022-03-12 DIAGNOSIS — F17210 Nicotine dependence, cigarettes, uncomplicated: Secondary | ICD-10-CM

## 2022-03-12 DIAGNOSIS — Z87891 Personal history of nicotine dependence: Secondary | ICD-10-CM

## 2022-03-12 DIAGNOSIS — Z122 Encounter for screening for malignant neoplasm of respiratory organs: Secondary | ICD-10-CM

## 2022-03-26 ENCOUNTER — Encounter: Payer: Self-pay | Admitting: Pulmonary Disease

## 2022-03-26 ENCOUNTER — Ambulatory Visit (INDEPENDENT_AMBULATORY_CARE_PROVIDER_SITE_OTHER): Payer: Medicare PPO | Admitting: Pulmonary Disease

## 2022-03-26 ENCOUNTER — Ambulatory Visit: Payer: Medicare PPO | Admitting: Pulmonary Disease

## 2022-03-26 VITALS — BP 102/60 | HR 85 | Ht 72.0 in | Wt 173.0 lb

## 2022-03-26 DIAGNOSIS — J431 Panlobular emphysema: Secondary | ICD-10-CM | POA: Diagnosis not present

## 2022-03-26 DIAGNOSIS — F17210 Nicotine dependence, cigarettes, uncomplicated: Secondary | ICD-10-CM | POA: Diagnosis not present

## 2022-03-26 LAB — PULMONARY FUNCTION TEST
DL/VA % pred: 35 %
DL/VA: 1.43 ml/min/mmHg/L
DLCO cor % pred: 33 %
DLCO cor: 9.21 ml/min/mmHg
DLCO unc % pred: 33 %
DLCO unc: 9.21 ml/min/mmHg
FEF 25-75 Post: 0.56 L/sec
FEF 25-75 Pre: 0.48 L/sec
FEF2575-%Change-Post: 18 %
FEF2575-%Pred-Post: 22 %
FEF2575-%Pred-Pre: 18 %
FEV1-%Change-Post: 10 %
FEV1-%Pred-Post: 36 %
FEV1-%Pred-Pre: 32 %
FEV1-Post: 1.24 L
FEV1-Pre: 1.13 L
FEV1FVC-%Change-Post: 3 %
FEV1FVC-%Pred-Pre: 53 %
FEV6-%Change-Post: 8 %
FEV6-%Pred-Post: 66 %
FEV6-%Pred-Pre: 60 %
FEV6-Post: 2.93 L
FEV6-Pre: 2.7 L
FEV6FVC-%Change-Post: 1 %
FEV6FVC-%Pred-Post: 101 %
FEV6FVC-%Pred-Pre: 99 %
FVC-%Change-Post: 6 %
FVC-%Pred-Post: 65 %
FVC-%Pred-Pre: 61 %
FVC-Post: 3.06 L
FVC-Pre: 2.88 L
Post FEV1/FVC ratio: 41 %
Post FEV6/FVC ratio: 96 %
Pre FEV1/FVC ratio: 39 %
Pre FEV6/FVC Ratio: 94 %
RV % pred: 260 %
RV: 6.77 L
TLC % pred: 148 %
TLC: 11.07 L

## 2022-03-26 MED ORDER — ALBUTEROL SULFATE HFA 108 (90 BASE) MCG/ACT IN AERS: 2.0000 | INHALATION_SPRAY | Freq: Four times a day (QID) | RESPIRATORY_TRACT | 6 refills | Status: DC | PRN

## 2022-03-26 MED ORDER — BREZTRI AEROSPHERE 160-9-4.8 MCG/ACT IN AERO: 2.0000 | INHALATION_SPRAY | Freq: Two times a day (BID) | RESPIRATORY_TRACT | 5 refills | Status: DC

## 2022-03-26 NOTE — Progress Notes (Signed)
PFT done today. 

## 2022-03-26 NOTE — Patient Instructions (Signed)
I will see you back in 3 months  Continue Breztri  Rescue inhaler as needed  Graded activities as tolerated  Call with significant concerns

## 2022-03-26 NOTE — Progress Notes (Signed)
Adam Gilmore    [id number]    1950-08-19  Primary Care Physician:Ehinger, Herbie [place], MD  Referring Physician: [name], MD [address] [place],  [place] [address]  Chief complaint:   Patient being seen for shortness of breath  HPI:  Patient with known chronic obstructive pulmonary disease Had a CT scan in January showing extensive emphysema Repeat CT scan of the chest shows stable findings, extensive emphysema  Hide above PFT done today showing severe obstructive disease, severely reduced diffusing capacity  56-pack-year smoking history Is cutting down on his smoking  Has been using Breztri, has not noticed any significant change in his symptoms  Is short of breath with moderate activity worse in the last couple years He did note significant worsening following having COVID  Walking up steps makes it worse Stopping and resting makes it better Denies any chest pains or chest discomfort  Persistent mucus production clear mucus  History of blood clots History of prostate cancer  Outpatient Encounter Medications as of 03/26/2022  Medication Sig   acetaminophen (TYLENOL) 325 MG tablet Take 325 mg by mouth every 6 (six) hours as needed (for pain.).   atorvastatin (LIPITOR) 20 MG tablet Take 1 tablet (20 mg total) by mouth daily.   Budeson-Glycopyrrol-Formoterol (BREZTRI AEROSPHERE) 160-9-4.8 MCG/ACT AERO Inhale 2 puffs into the lungs in the morning and at bedtime.   fluticasone (FLONASE) 50 MCG/ACT nasal spray Place 2 sprays into both nostrils daily.   latanoprost (XALATAN) 0.005 % ophthalmic solution Place 1 drop into both eyes at bedtime.   niacin 500 MG tablet Take 500 mg by mouth at bedtime.   rivaroxaban (XARELTO) 20 MG TABS tablet Take 20 mg by mouth daily with supper.   vitamin C (ASCORBIC ACID) 500 MG tablet Take 500 mg by mouth daily.   vitamin E 180 MG (400 UNITS) capsule Take 400 Units by mouth daily.   No facility-administered  encounter medications on file as of 03/26/2022.    Allergies as of 03/26/2022 - Review Complete 03/26/2022  Allergen Reaction Noted   No known allergies  05/21/2016    Past Medical History:  Diagnosis Date   Adenomatous colon polyp    Centrilobular emphysema (HCC)    COPD (chronic obstructive pulmonary disease) ([place])    History of DVT of lower extremity    Hypercholesterolemia    Hypercholesterolemia    prostate ca dx'd 03/2012   surg only   Skin cancer    Tobacco dependence     Past Surgical History:  Procedure Laterality Date   APPENDECTOMY     June 14, 2015   COLONOSCOPY  1/14   INSERTION OF MESH N/A 05/22/2016   Procedure: INSERTION OF MESH;  Surgeon: Clovis Riley, MD;  Location: [facility];  Service: General;  Laterality: N/A;   LYMPHADENECTOMY Bilateral 07/09/2012   Procedure: Noel Journey;  Surgeon: Dutch Gray, MD;  Location: [facility];  Service: Urology;  Laterality: Bilateral;   ROBOT ASSISTED LAPAROSCOPIC RADICAL PROSTATECTOMY N/A 07/09/2012   Procedure: ROBOTIC ASSISTED LAPAROSCOPIC RADICAL PROSTATECTOMY LEVEL 2;  Surgeon: Dutch Gray, MD;  Location: [facility];  Service: Urology;  Laterality: N/A;   TONSILLECTOMY     VENTRAL HERNIA REPAIR N/A 05/22/2016   Procedure: LAPAROSCOPIC VENTRAL HERNIA REPAIR WITH MESH;  Surgeon: Clovis Riley, MD;  Location: [facility];  Service: General;  Laterality: N/A;    Family History  Problem Relation Age of Onset   CAD Mother  CVA Mother    Cancer Father        prostate cancer   CAD Brother    Cancer Brother        prostate cancer     Social History   Socioeconomic History   Marital status: Married    Spouse name: Not on file   Number of children: Not on file   Years of education: Not on file   Highest education level: Not on file  Occupational History   Not on file  Tobacco Use   Smoking status: Every Day    Packs/day: 1.00    Years: 56.00    Total pack years: 56.00    Types: Cigarettes   Smokeless tobacco: Never   Vaping Use   Vaping Use: Never used  Substance and Sexual Activity   Alcohol use: Yes    Comment: socially   Drug use: No   Sexual activity: Not Currently  Other Topics Concern   Not on file  Social History Narrative   Not on file   Social Determinants of Health   Financial Resource Strain: Not on file  Food Insecurity: Not on file  Transportation Needs: Not on file  Physical Activity: Not on file  Stress: Not on file  Social Connections: Not on file  Intimate Partner Violence: Not on file    Review of Systems  Respiratory:  Positive for cough and shortness of breath.     Vitals:   03/26/22 1550  BP: 102/60  Pulse: 85  SpO2: 100%     Physical Exam Constitutional:      Appearance: Normal appearance.  HENT:     Head: Normocephalic.     Mouth/Throat:     Mouth: Mucous membranes are moist.  Eyes:     Conjunctiva/sclera: Conjunctivae normal.  Cardiovascular:     Rate and Rhythm: Normal rate and regular rhythm.     Heart sounds: No murmur heard.    No friction rub.  Pulmonary:     Effort: No respiratory distress.     Breath sounds: No stridor. No wheezing or rhonchi.  Musculoskeletal:     Cervical back: No rigidity or tenderness.  Neurological:     Mental Status: He is alert.  Psychiatric:        Mood and Affect: Mood normal.    Data Reviewed: CT scan of the chest from 04/02/2021 reviewed with the patient showing extensive emphysema, some scarring right upper lobe  PFT reviewed with patient showing Assessment:  Advanced chronic obstructive pulmonary disease Gold stage III COPD  Shortness of breath on exertion  Active smoker   Plan/Recommendations: Smoking cessation counseling  Continue Breztri  Rescue inhaler use as needed  Graded exercise as tolerated  The importance of continuing to work on quitting smoking was reiterated  Follow-up in 3 months   Sherrilyn Rist MD [facility] 03/26/2022, 4:05 PM  CC:  [name], MD

## 2022-05-27 DIAGNOSIS — C61 Malignant neoplasm of prostate: Secondary | ICD-10-CM | POA: Diagnosis not present

## 2022-05-27 DIAGNOSIS — Z8546 Personal history of malignant neoplasm of prostate: Secondary | ICD-10-CM | POA: Diagnosis not present

## 2022-05-27 DIAGNOSIS — N5201 Erectile dysfunction due to arterial insufficiency: Secondary | ICD-10-CM | POA: Diagnosis not present

## 2022-05-27 DIAGNOSIS — N3946 Mixed incontinence: Secondary | ICD-10-CM | POA: Diagnosis not present

## 2022-07-23 DIAGNOSIS — H2513 Age-related nuclear cataract, bilateral: Secondary | ICD-10-CM | POA: Diagnosis not present

## 2022-07-23 DIAGNOSIS — H401132 Primary open-angle glaucoma, bilateral, moderate stage: Secondary | ICD-10-CM | POA: Diagnosis not present

## 2022-07-24 DIAGNOSIS — L309 Dermatitis, unspecified: Secondary | ICD-10-CM | POA: Diagnosis not present

## 2022-07-24 DIAGNOSIS — L821 Other seborrheic keratosis: Secondary | ICD-10-CM | POA: Diagnosis not present

## 2022-07-24 DIAGNOSIS — L820 Inflamed seborrheic keratosis: Secondary | ICD-10-CM | POA: Diagnosis not present

## 2022-07-24 DIAGNOSIS — L814 Other melanin hyperpigmentation: Secondary | ICD-10-CM | POA: Diagnosis not present

## 2022-07-24 DIAGNOSIS — Z85828 Personal history of other malignant neoplasm of skin: Secondary | ICD-10-CM | POA: Diagnosis not present

## 2022-07-24 DIAGNOSIS — L298 Other pruritus: Secondary | ICD-10-CM | POA: Diagnosis not present

## 2022-07-24 DIAGNOSIS — L810 Postinflammatory hyperpigmentation: Secondary | ICD-10-CM | POA: Diagnosis not present

## 2022-07-24 DIAGNOSIS — D225 Melanocytic nevi of trunk: Secondary | ICD-10-CM | POA: Diagnosis not present

## 2022-07-24 DIAGNOSIS — Z08 Encounter for follow-up examination after completed treatment for malignant neoplasm: Secondary | ICD-10-CM | POA: Diagnosis not present

## 2022-09-22 NOTE — H&P (View-Only) (Signed)
Cardiology Office Note:    Date:  09/22/2022   ID:  Adam Gilmore, DOB 1950-10-24, MRN [id number]  PCP:  Blair Heys, MD   [facility] Providers Cardiologist:  Alverda Skeans, MD Referring MD: Blair Heys, MD   Chief Complaint/Reason for Referral: Coronary artery calcification  ASSESSMENT:    1. Coronary artery calcification seen on CAT scan   2. Aortic atherosclerosis (HCC)   3. Elevated Lp(a)   4. Chronic obstructive pulmonary disease, unspecified COPD type (HCC)     PLAN:    In order of problems listed above: 1.  Coronary artery calcification: Continue anticoagulation and lieu of aspirin, statin, and strict blood pressure control. 2.  Aortic atherosclerosis: See discussion above. 3.  Hyperlipidemia: Goal LDL is less than 55, check lipid panel and LFTs today. 4.  COPD: Abdominal ultrasound in 2023 was negative.  Will screen ABIs and carotids.         {Are you ordering a CV Procedure (e.g. stress test, cath, DCCV, TEE, etc)?   Press F2        :[id number]}   Dispo:  No follow-ups on file.      Medication Adjustments/Labs and Tests Ordered: Current medicines are reviewed at length with the patient today.  Concerns regarding medicines are outlined above.  The following changes have been made:  {PLAN; NO CHANGE:13088:s}   Labs/tests ordered: No orders of the defined types were placed in this encounter.   Medication Changes: No orders of the defined types were placed in this encounter.   Current medicines are reviewed at length with the patient today.  The patient {ACTIONS; HAS/DOES NOT HAVE:19233} concerns regarding medicines.  History of Present Illness:    FOCUSED PROBLEM LIST:   1.  COPD and emphysema with ongoing tobacco abuse 2.  Recurrent DVT on indefinite anticoagulation 3.  Aortic and coronary artery calcification 4.  Hyperlipidemia; elevated LP(a) 5.  Tobacco abuse   March 2023: Patient seen for initial consultation regarding  incidentally noted coronary artery calcification.  He was without chest pain and so no further changes were made to his medical regimen.  He was referred for abdominal aortic aneurysm screening ultrasound which was unremarkable.   June 2023: The patient remains angina free.  Is able to walk without claudication symptoms.  His biggest issue is a continual cough.  He does not wheeze.  He is required no emergency room visits or hospitalizations recently for breathing issues or chest pain.  He has had no severe bleeding or bruising while on anticoagulation.  He otherwise feels well and is looking forward to going to the beach this summer.  Plan: Continue medical therapy; check lipids, LP(a), and LFTs.  Today: In the interim his LDL was found to be 72 with an elevated LP(a).  He was seen by Dr. Rennis Golden and his atorvastatin dose was increased.       Previous Medical History: Past Medical History:  Diagnosis Date   Adenomatous colon polyp    Centrilobular emphysema (HCC)    COPD (chronic obstructive pulmonary disease) (HCC)    History of DVT of lower extremity    Hypercholesterolemia    Hypercholesterolemia    prostate ca dx'd 03/2012   surg only   Skin cancer    Tobacco dependence      Current Medications: No outpatient medications have been marked as taking for the 09/26/22 encounter (Appointment) with Orbie Pyo, MD.     Allergies:    No known allergies   Social  History:   Social History   Tobacco Use   Smoking status: Every Day    Packs/day: 1.00    Years: 56.00    Additional pack years: 0.00    Total pack years: 56.00    Types: Cigarettes   Smokeless tobacco: Never  Vaping Use   Vaping Use: Never used  Substance Use Topics   Alcohol use: Yes    Comment: socially   Drug use: No     Family Hx: Family History  Problem Relation Age of Onset   CAD Mother    CVA Mother    Cancer Father        prostate cancer   CAD Brother    Cancer Brother        prostate cancer       Review of Systems:   Please see the history of present illness.    All other systems reviewed and are negative.     EKGs/Labs/Other Test Reviewed:    EKG:    EKG Interpretation Date/Time:    Ventricular Rate:    PR Interval:    QRS Duration:    QT Interval:    QTC Calculation:   R Axis:      Text Interpretation:           Prior CV studies reviewed:  Cardiac Studies & Procedures       ECHOCARDIOGRAM  ECHOCARDIOGRAM COMPLETE 06/04/2021  Narrative ECHOCARDIOGRAM REPORT    Patient Name:   Adam Gilmore Date of Exam: 06/04/2021 Medical Rec #:  [id number]        Height:       72.0 in Accession #:    [id number]       Weight:       175.6 lb Date of Birth:  [birth year]        BSA:          2.016 m Patient Age:    72 years         BP:           118/70 mmHg Patient Gender: M                HR:           67 bpm. Exam Location:  [address]  Procedure: 2D Echo, Cardiac Doppler and Color Doppler  Indications:    R06.00 Dyspnea  History:        Patient has no prior history of Echocardiogram examinations. COPD; Risk Factors:Dyslipidemia.  Sonographer:    Daphine Deutscher RDCS Referring Phys: [id number] Orbie Pyo  IMPRESSIONS   1. Left ventricular ejection fraction, by estimation, is 60 to 65%. The left ventricle has normal function. The left ventricle has no regional wall motion abnormalities. There is mild concentric left ventricular hypertrophy of the septal segment. Left ventricular diastolic parameters were normal. 2. Right ventricular systolic function is normal. The right ventricular size is normal. 3. The mitral valve is normal in structure. No evidence of mitral valve regurgitation. No evidence of mitral stenosis. 4. The aortic valve has an indeterminant number of cusps. Aortic valve regurgitation is not visualized. Aortic valve sclerosis/calcification is present, without any evidence of aortic stenosis. 5. There is borderline dilatation of  the aortic root, measuring 37 mm. There is moderate dilatation of the ascending aorta, measuring 40 mm. 6. The inferior vena cava is normal in size with greater than 50% respiratory variability, suggesting right atrial pressure of 3 mmHg.  FINDINGS Left Ventricle: Left ventricular ejection  fraction, by estimation, is 60 to 65%. The left ventricle has normal function. The left ventricle has no regional wall motion abnormalities. The left ventricular internal cavity size was normal in size. There is mild concentric left ventricular hypertrophy of the septal segment. Left ventricular diastolic parameters were normal.  Right Ventricle: The right ventricular size is normal. No increase in right ventricular wall thickness. Right ventricular systolic function is normal.  Left Atrium: Left atrial size was normal in size.  Right Atrium: Right atrial size was normal in size.  Pericardium: There is no evidence of pericardial effusion.  Mitral Valve: The mitral valve is normal in structure. No evidence of mitral valve regurgitation. No evidence of mitral valve stenosis.  Tricuspid Valve: The tricuspid valve is normal in structure. Tricuspid valve regurgitation is trivial. No evidence of tricuspid stenosis.  Aortic Valve: The aortic valve has an indeterminant number of cusps. Aortic valve regurgitation is not visualized. Aortic valve sclerosis/calcification is present, without any evidence of aortic stenosis. Aortic valve mean gradient measures 4.0 mmHg. Aortic valve peak gradient measures 7.6 mmHg. Aortic valve area, by VTI measures 2.44 cm.  Pulmonic Valve: The pulmonic valve was normal in structure. Pulmonic valve regurgitation is not visualized. No evidence of pulmonic stenosis.  Aorta: There is borderline dilatation of the aortic root, measuring 37 mm. There is moderate dilatation of the ascending aorta, measuring 40 mm.  Venous: The inferior vena cava is normal in size with greater than 50%  respiratory variability, suggesting right atrial pressure of 3 mmHg.  IAS/Shunts: No atrial level shunt detected by color flow Doppler.   LEFT VENTRICLE PLAX 2D LVIDd:         4.40 cm   Diastology LVIDs:         3.10 cm   LV e' medial:    9.70 cm/s LV PW:         1.00 cm   LV E/e' medial:  5.0 LV IVS:        1.10 cm   LV e' lateral:   10.81 cm/s LVOT diam:     2.20 cm   LV E/e' lateral: 4.5 LV SV:         67 LV SV Index:   33 LVOT Area:     3.80 cm   RIGHT VENTRICLE             IVC RV Basal diam:  3.80 cm     IVC diam: 1.70 cm RV S prime:     14.65 cm/s TAPSE (M-mode): 2.5 cm  LEFT ATRIUM             Index        RIGHT ATRIUM           Index LA diam:        4.20 cm 2.08 cm/m   RA Area:     14.10 cm LA Vol (A2C):   40.9 ml 20.29 ml/m  RA Volume:   38.00 ml  18.85 ml/m LA Vol (A4C):   28.3 ml 14.04 ml/m LA Biplane Vol: 34.8 ml 17.26 ml/m AORTIC VALVE AV Area (Vmax):    2.53 cm AV Area (Vmean):   2.24 cm AV Area (VTI):     2.44 cm AV Vmax:           138.00 cm/s AV Vmean:          94.400 cm/s AV VTI:            0.273 m AV Peak  Grad:      7.6 mmHg AV Mean Grad:      4.0 mmHg LVOT Vmax:         91.75 cm/s LVOT Vmean:        55.700 cm/s LVOT VTI:          0.176 m LVOT/AV VTI ratio: 0.64  AORTA Ao Root diam: 4.00 cm Ao Asc diam:  3.70 cm  MITRAL VALVE               TRICUSPID VALVE MV Area (PHT): 2.89 cm    TR Peak grad:   24.0 mmHg MV Decel Time: 263 msec    TR Vmax:        245.00 cm/s MV E velocity: 48.70 cm/s MV A velocity: 66.35 cm/s  SHUNTS MV E/A ratio:  0.73        Systemic VTI:  0.18 m Systemic Diam: 2.20 cm  Kardie Tobb DO Electronically signed by Thomasene Ripple DO Signature Date/Time: 06/04/2021/5:39:53 PM    Final             Other studies Reviewed: Review of the additional studies/records demonstrates: CT 2020-10-02 three-vessel coronary atherosclerosis and aortic atherosclerosis   Recent Labs: No results found for requested labs within last 365  days.   Lipid Panel    Component Value Date/Time   CHOL 132 09/13/2021 1038   TRIG 55 09/13/2021 1038   HDL 48 09/13/2021 1038   CHOLHDL 2.8 09/13/2021 1038   LDLCALC 72 09/13/2021 1038    Risk Assessment/Calculations:    {Does this patient have ATRIAL FIBRILLATION?:[id number]}      No BP recorded.  {Refresh Note OR Click here to enter BP  :1}***    Physical Exam:    VS:  There were no vitals taken for this visit.   Wt Readings from Last 3 Encounters:  03/26/22 173 lb (78.5 kg)  03/05/22 171 lb 9.6 oz (77.8 kg)  01/31/22 169 lb 3.2 oz (76.7 kg)    No BP recorded.  {Refresh Note OR Click here to enter BP  :1}***    GENERAL:  No apparent distress, AOx3 HEENT:  No carotid bruits, +2 carotid impulses, no scleral icterus CAR: RRR Irregular RR*** no murmurs***, gallops, rubs, or thrills RES:  Clear to auscultation bilaterally ABD:  Soft, nontender, nondistended, positive bowel sounds x 4 VASC:  +2 radial pulses, +2 carotid pulses, palpable pedal pulses NEURO:  CN 2-12 grossly intact; motor and sensory grossly intact PSYCH:  No active depression or anxiety EXT:  No edema, ecchymosis, or cyanosis  Signed, Orbie Pyo, MD  09/22/2022 10:13 AM    [facility] [address] [place], [place], [place]  [address] Phone: [phone]; Fax: [phone]   Note:  This document was prepared using Dragon voice recognition software and may include unintentional dictation errors.

## 2022-09-22 NOTE — Progress Notes (Unsigned)
Cardiology Office Note:    Date:  09/22/2022   ID:  Adam Gilmore, DOB 1950-10-24, MRN [id number]  PCP:  Blair Heys, MD   [facility] Providers Cardiologist:  Alverda Skeans, MD Referring MD: Blair Heys, MD   Chief Complaint/Reason for Referral: Coronary artery calcification  ASSESSMENT:    1. Coronary artery calcification seen on CAT scan   2. Aortic atherosclerosis (HCC)   3. Elevated Lp(a)   4. Chronic obstructive pulmonary disease, unspecified COPD type (HCC)     PLAN:    In order of problems listed above: 1.  Coronary artery calcification: Continue anticoagulation and lieu of aspirin, statin, and strict blood pressure control. 2.  Aortic atherosclerosis: See discussion above. 3.  Hyperlipidemia: Goal LDL is less than 55, check lipid panel and LFTs today. 4.  COPD: Abdominal ultrasound in 2023 was negative.  Will screen ABIs and carotids.         {Are you ordering a CV Procedure (e.g. stress test, cath, DCCV, TEE, etc)?   Press F2        :[id number]}   Dispo:  No follow-ups on file.      Medication Adjustments/Labs and Tests Ordered: Current medicines are reviewed at length with the patient today.  Concerns regarding medicines are outlined above.  The following changes have been made:  {PLAN; NO CHANGE:13088:s}   Labs/tests ordered: No orders of the defined types were placed in this encounter.   Medication Changes: No orders of the defined types were placed in this encounter.   Current medicines are reviewed at length with the patient today.  The patient {ACTIONS; HAS/DOES NOT HAVE:19233} concerns regarding medicines.  History of Present Illness:    FOCUSED PROBLEM LIST:   1.  COPD and emphysema with ongoing tobacco abuse 2.  Recurrent DVT on indefinite anticoagulation 3.  Aortic and coronary artery calcification 4.  Hyperlipidemia; elevated LP(a) 5.  Tobacco abuse   March 2023: Patient seen for initial consultation regarding  incidentally noted coronary artery calcification.  He was without chest pain and so no further changes were made to his medical regimen.  He was referred for abdominal aortic aneurysm screening ultrasound which was unremarkable.   June 2023: The patient remains angina free.  Is able to walk without claudication symptoms.  His biggest issue is a continual cough.  He does not wheeze.  He is required no emergency room visits or hospitalizations recently for breathing issues or chest pain.  He has had no severe bleeding or bruising while on anticoagulation.  He otherwise feels well and is looking forward to going to the beach this summer.  Plan: Continue medical therapy; check lipids, LP(a), and LFTs.  Today: In the interim his LDL was found to be 72 with an elevated LP(a).  He was seen by Dr. Rennis Golden and his atorvastatin dose was increased.       Previous Medical History: Past Medical History:  Diagnosis Date   Adenomatous colon polyp    Centrilobular emphysema (HCC)    COPD (chronic obstructive pulmonary disease) (HCC)    History of DVT of lower extremity    Hypercholesterolemia    Hypercholesterolemia    prostate ca dx'd 03/2012   surg only   Skin cancer    Tobacco dependence      Current Medications: No outpatient medications have been marked as taking for the 09/26/22 encounter (Appointment) with Orbie Pyo, MD.     Allergies:    No known allergies   Social  History:   Social History   Tobacco Use   Smoking status: Every Day    Packs/day: 1.00    Years: 56.00    Additional pack years: 0.00    Total pack years: 56.00    Types: Cigarettes   Smokeless tobacco: Never  Vaping Use   Vaping Use: Never used  Substance Use Topics   Alcohol use: Yes    Comment: socially   Drug use: No     Family Hx: Family History  Problem Relation Age of Onset   CAD Mother    CVA Mother    Cancer Father        prostate cancer   CAD Brother    Cancer Brother        prostate cancer       Review of Systems:   Please see the history of present illness.    All other systems reviewed and are negative.     EKGs/Labs/Other Test Reviewed:    EKG:    EKG Interpretation Date/Time:    Ventricular Rate:    PR Interval:    QRS Duration:    QT Interval:    QTC Calculation:   R Axis:      Text Interpretation:           Prior CV studies reviewed:  Cardiac Studies & Procedures       ECHOCARDIOGRAM  ECHOCARDIOGRAM COMPLETE 06/04/2021  Narrative ECHOCARDIOGRAM REPORT    Patient Name:   Adam Gilmore Date of Exam: 06/04/2021 Medical Rec #:  [id number]        Height:       72.0 in Accession #:    [id number]       Weight:       175.6 lb Date of Birth:  [birth year]        BSA:          2.016 m Patient Age:    70 years         BP:           118/70 mmHg Patient Gender: M                HR:           67 bpm. Exam Location:  [address]  Procedure: 2D Echo, Cardiac Doppler and Color Doppler  Indications:    R06.00 Dyspnea  History:        Patient has no prior history of Echocardiogram examinations. COPD; Risk Factors:Dyslipidemia.  Sonographer:    Daphine Deutscher RDCS Referring Phys: [id number] Orbie Pyo  IMPRESSIONS   1. Left ventricular ejection fraction, by estimation, is 60 to 65%. The left ventricle has normal function. The left ventricle has no regional wall motion abnormalities. There is mild concentric left ventricular hypertrophy of the septal segment. Left ventricular diastolic parameters were normal. 2. Right ventricular systolic function is normal. The right ventricular size is normal. 3. The mitral valve is normal in structure. No evidence of mitral valve regurgitation. No evidence of mitral stenosis. 4. The aortic valve has an indeterminant number of cusps. Aortic valve regurgitation is not visualized. Aortic valve sclerosis/calcification is present, without any evidence of aortic stenosis. 5. There is borderline dilatation of  the aortic root, measuring 37 mm. There is moderate dilatation of the ascending aorta, measuring 40 mm. 6. The inferior vena cava is normal in size with greater than 50% respiratory variability, suggesting right atrial pressure of 3 mmHg.  FINDINGS Left Ventricle: Left ventricular ejection  fraction, by estimation, is 60 to 65%. The left ventricle has normal function. The left ventricle has no regional wall motion abnormalities. The left ventricular internal cavity size was normal in size. There is mild concentric left ventricular hypertrophy of the septal segment. Left ventricular diastolic parameters were normal.  Right Ventricle: The right ventricular size is normal. No increase in right ventricular wall thickness. Right ventricular systolic function is normal.  Left Atrium: Left atrial size was normal in size.  Right Atrium: Right atrial size was normal in size.  Pericardium: There is no evidence of pericardial effusion.  Mitral Valve: The mitral valve is normal in structure. No evidence of mitral valve regurgitation. No evidence of mitral valve stenosis.  Tricuspid Valve: The tricuspid valve is normal in structure. Tricuspid valve regurgitation is trivial. No evidence of tricuspid stenosis.  Aortic Valve: The aortic valve has an indeterminant number of cusps. Aortic valve regurgitation is not visualized. Aortic valve sclerosis/calcification is present, without any evidence of aortic stenosis. Aortic valve mean gradient measures 4.0 mmHg. Aortic valve peak gradient measures 7.6 mmHg. Aortic valve area, by VTI measures 2.44 cm.  Pulmonic Valve: The pulmonic valve was normal in structure. Pulmonic valve regurgitation is not visualized. No evidence of pulmonic stenosis.  Aorta: There is borderline dilatation of the aortic root, measuring 37 mm. There is moderate dilatation of the ascending aorta, measuring 40 mm.  Venous: The inferior vena cava is normal in size with greater than 50%  respiratory variability, suggesting right atrial pressure of 3 mmHg.  IAS/Shunts: No atrial level shunt detected by color flow Doppler.   LEFT VENTRICLE PLAX 2D LVIDd:         4.40 cm   Diastology LVIDs:         3.10 cm   LV e' medial:    9.70 cm/s LV PW:         1.00 cm   LV E/e' medial:  5.0 LV IVS:        1.10 cm   LV e' lateral:   10.81 cm/s LVOT diam:     2.20 cm   LV E/e' lateral: 4.5 LV SV:         67 LV SV Index:   33 LVOT Area:     3.80 cm   RIGHT VENTRICLE             IVC RV Basal diam:  3.80 cm     IVC diam: 1.70 cm RV S prime:     14.65 cm/s TAPSE (M-mode): 2.5 cm  LEFT ATRIUM             Index        RIGHT ATRIUM           Index LA diam:        4.20 cm 2.08 cm/m   RA Area:     14.10 cm LA Vol (A2C):   40.9 ml 20.29 ml/m  RA Volume:   38.00 ml  18.85 ml/m LA Vol (A4C):   28.3 ml 14.04 ml/m LA Biplane Vol: 34.8 ml 17.26 ml/m AORTIC VALVE AV Area (Vmax):    2.53 cm AV Area (Vmean):   2.24 cm AV Area (VTI):     2.44 cm AV Vmax:           138.00 cm/s AV Vmean:          94.400 cm/s AV VTI:            0.273 m AV Peak  Grad:      7.6 mmHg AV Mean Grad:      4.0 mmHg LVOT Vmax:         91.75 cm/s LVOT Vmean:        55.700 cm/s LVOT VTI:          0.176 m LVOT/AV VTI ratio: 0.64  AORTA Ao Root diam: 4.00 cm Ao Asc diam:  3.70 cm  MITRAL VALVE               TRICUSPID VALVE MV Area (PHT): 2.89 cm    TR Peak grad:   24.0 mmHg MV Decel Time: 263 msec    TR Vmax:        245.00 cm/s MV E velocity: 48.70 cm/s MV A velocity: 66.35 cm/s  SHUNTS MV E/A ratio:  0.73        Systemic VTI:  0.18 m Systemic Diam: 2.20 cm  Kardie Tobb DO Electronically signed by Thomasene Ripple DO Signature Date/Time: 06/04/2021/5:39:53 PM    Final             Other studies Reviewed: Review of the additional studies/records demonstrates: CT 2020-10-02 three-vessel coronary atherosclerosis and aortic atherosclerosis   Recent Labs: No results found for requested labs within last 365  days.   Lipid Panel    Component Value Date/Time   CHOL 132 09/13/2021 1038   TRIG 55 09/13/2021 1038   HDL 48 09/13/2021 1038   CHOLHDL 2.8 09/13/2021 1038   LDLCALC 72 09/13/2021 1038    Risk Assessment/Calculations:    {Does this patient have ATRIAL FIBRILLATION?:[id number]}      No BP recorded.  {Refresh Note OR Click here to enter BP  :1}***    Physical Exam:    VS:  There were no vitals taken for this visit.   Wt Readings from Last 3 Encounters:  03/26/22 173 lb (78.5 kg)  03/05/22 171 lb 9.6 oz (77.8 kg)  01/31/22 169 lb 3.2 oz (76.7 kg)    No BP recorded.  {Refresh Note OR Click here to enter BP  :1}***    GENERAL:  No apparent distress, AOx3 HEENT:  No carotid bruits, +2 carotid impulses, no scleral icterus CAR: RRR Irregular RR*** no murmurs***, gallops, rubs, or thrills RES:  Clear to auscultation bilaterally ABD:  Soft, nontender, nondistended, positive bowel sounds x 4 VASC:  +2 radial pulses, +2 carotid pulses, palpable pedal pulses NEURO:  CN 2-12 grossly intact; motor and sensory grossly intact PSYCH:  No active depression or anxiety EXT:  No edema, ecchymosis, or cyanosis  Signed, Orbie Pyo, MD  09/22/2022 10:13 AM    [facility] [address] [place], [place], [place]  [address] Phone: [phone]; Fax: [phone]   Note:  This document was prepared using Dragon voice recognition software and may include unintentional dictation errors.

## 2022-09-26 ENCOUNTER — Ambulatory Visit (INDEPENDENT_AMBULATORY_CARE_PROVIDER_SITE_OTHER): Payer: Medicare PPO

## 2022-09-26 ENCOUNTER — Encounter: Payer: Self-pay | Admitting: Internal Medicine

## 2022-09-26 ENCOUNTER — Ambulatory Visit: Payer: Medicare PPO | Attending: Internal Medicine | Admitting: Internal Medicine

## 2022-09-26 VITALS — BP 116/72 | HR 62 | Ht 72.0 in | Wt 166.8 lb

## 2022-09-26 DIAGNOSIS — I2510 Atherosclerotic heart disease of native coronary artery without angina pectoris: Secondary | ICD-10-CM

## 2022-09-26 DIAGNOSIS — M79604 Pain in right leg: Secondary | ICD-10-CM | POA: Diagnosis not present

## 2022-09-26 DIAGNOSIS — J449 Chronic obstructive pulmonary disease, unspecified: Secondary | ICD-10-CM

## 2022-09-26 DIAGNOSIS — E7841 Elevated Lipoprotein(a): Secondary | ICD-10-CM

## 2022-09-26 DIAGNOSIS — I4891 Unspecified atrial fibrillation: Secondary | ICD-10-CM

## 2022-09-26 DIAGNOSIS — M79605 Pain in left leg: Secondary | ICD-10-CM

## 2022-09-26 DIAGNOSIS — R0609 Other forms of dyspnea: Secondary | ICD-10-CM

## 2022-09-26 DIAGNOSIS — I700 Atherosclerosis of aorta: Secondary | ICD-10-CM

## 2022-09-26 MED ORDER — ATORVASTATIN CALCIUM 40 MG PO TABS: 40.0000 mg | ORAL_TABLET | Freq: Every day | ORAL | 3 refills | Status: DC

## 2022-09-26 MED ORDER — METOPROLOL TARTRATE 50 MG PO TABS: 50.0000 mg | ORAL_TABLET | Freq: Once | ORAL | 0 refills | Status: DC

## 2022-09-26 NOTE — Progress Notes (Unsigned)
Applied a 3 day Zio XT monitor to patient in the [employer]

## 2022-09-26 NOTE — Patient Instructions (Addendum)
Medication Instructions:  Your physician has recommended you make the following change in your medication:  1.) increase atorvastatin to 40 mg - one tablet daily  *If you need a refill on your cardiac medications before your next appointment, please call your pharmacy*   Lab Work: Please return in 2 months for blood work (lipids, liver)   Testing/Procedures: Your physician has requested that you have an echocardiogram. Echocardiography is a painless test that uses sound waves to create images of your heart. It provides your doctor with information about the size and shape of your heart and how well your heart's chambers and valves are working. This procedure takes approximately one hour. There are no restrictions for this procedure. Please do NOT wear cologne, perfume, aftershave, or lotions (deodorant is allowed). Please arrive 15 minutes prior to your appointment time.  3 day Zio Heart Monitor  Vascuscreen - carotids  Your physician has requested that you have an ankle brachial index (ABI). During this test an ultrasound and blood pressure cuff are used to evaluate the arteries that supply the arms and legs with blood. Allow thirty minutes for this exam. There are no restrictions or special instructions.  Coronary CTA - see instructions below    Follow-Up: At [facility], you and your health needs are our priority.  As part of our continuing mission to provide you with exceptional heart care, we have created designated Provider Care Teams.  These Care Teams include your primary Cardiologist (physician) and Advanced Practice Providers (APPs -  Physician Assistants and Nurse Practitioners) who all work together to provide you with the care you need, when you need it.   Your next appointment:   3 month(s)  Provider:   Advanced Practice Provider (NP or PA-C)    Your cardiac CT will be scheduled at [facility] [address] [place], [place]  [phone]  Please arrive at the [facility] and [facility] Entrance (Entrance C2) of [facility] 30 minutes prior to test start time. You can use the FREE valet parking offered at entrance C (encouraged to control the heart rate for the test)  Proceed to the [facility] Radiology Department (first floor) to check-in and test prep.  All radiology patients and guests should use entrance C2 at [facility], accessed from [facility], even though the hospital's physical address listed is [address].      Please follow these instructions carefully (unless otherwise directed):  An IV will be required for this test and Nitroglycerin will be given.  Hold all erectile dysfunction medications at least 3 days (72 hrs) prior to test. (Ie viagra, cialis, sildenafil, tadalafil, etc)   On the Night Before the Test: Be sure to Drink plenty of water. Do not consume any caffeinated/decaffeinated beverages or chocolate 12 hours prior to your test. Do not take any antihistamines 12 hours prior to your test.   On the Day of the Test: Drink plenty of water until 1 hour prior to the test. Do not eat any food 1 hour prior to test. You may take your regular medications prior to the test.  Take metoprolol (Lopressor) two hours prior to test.       After the Test: Drink plenty of water. After receiving IV contrast, you may experience a mild flushed feeling. This is normal. On occasion, you may experience a mild rash up to 24 hours after the test. This is not dangerous. If this occurs, you can  take Benadryl 25 mg and increase your fluid intake. If you experience trouble breathing, this can be serious. If it is severe call 911 IMMEDIATELY. If it is mild, please call our office. If you take any of these medications: Glipizide/Metformin, Avandament, Glucavance, please do not take 48 hours after completing test unless otherwise instructed.  We will call to schedule  your test 2-4 weeks out understanding that some insurance companies will need an authorization prior to the service being performed.   For more information and frequently asked questions, please visit our website : [email or web address]  For non-scheduling related questions, please contact the cardiac imaging nurse navigator should you have any questions/concerns: Rockwell Alexandria, Cardiac Imaging Nurse Navigator Larey Brick, Cardiac Imaging Nurse Navigator [facility] Direct Office Dial: [phone]   For scheduling needs, including cancellations and rescheduling, please call [place], [phone].

## 2022-09-30 ENCOUNTER — Telehealth: Payer: Self-pay | Admitting: *Deleted

## 2022-09-30 ENCOUNTER — Telehealth (HOSPITAL_COMMUNITY): Payer: Self-pay | Admitting: *Deleted

## 2022-09-30 DIAGNOSIS — R0609 Other forms of dyspnea: Secondary | ICD-10-CM

## 2022-09-30 DIAGNOSIS — I2510 Atherosclerotic heart disease of native coronary artery without angina pectoris: Secondary | ICD-10-CM

## 2022-09-30 NOTE — Telephone Encounter (Signed)
Message from Dr. Lynnette Caffey that patient is in atrial fibrillation currently and coronary CTA cannot be completed because of this.  MD requested patient be scheduled for lexiscan myoview instead.

## 2022-09-30 NOTE — Telephone Encounter (Signed)
Patient called and informed him we won't be able to do this cardiac CT scan due to his atrial fibrillation. Informed him and Dr. Trula Ore office will be in touch with the next steps.  He verbalized understanding.  Larey Brick RN Navigator Cardiac Imaging [facility] [phone] Office [phone] Cell

## 2022-10-01 ENCOUNTER — Encounter: Payer: Self-pay | Admitting: *Deleted

## 2022-10-01 ENCOUNTER — Ambulatory Visit (HOSPITAL_COMMUNITY): Payer: Medicare PPO

## 2022-10-01 NOTE — Telephone Encounter (Signed)
Called and spoke w patient.  Reviewed plan for lexiscan since unable to do coronary CTA.  Pt voices understanding and agreement with plan and that I am sending instructions through portal and patient will be called to schedule.

## 2022-10-02 ENCOUNTER — Ambulatory Visit (HOSPITAL_COMMUNITY): Payer: Medicare PPO

## 2022-10-08 ENCOUNTER — Telehealth (HOSPITAL_COMMUNITY): Payer: Self-pay | Admitting: *Deleted

## 2022-10-08 DIAGNOSIS — I2510 Atherosclerotic heart disease of native coronary artery without angina pectoris: Secondary | ICD-10-CM | POA: Diagnosis not present

## 2022-10-08 DIAGNOSIS — J449 Chronic obstructive pulmonary disease, unspecified: Secondary | ICD-10-CM | POA: Diagnosis not present

## 2022-10-08 NOTE — Telephone Encounter (Signed)
Pt reached and given instructions for MPI study. 

## 2022-10-09 ENCOUNTER — Other Ambulatory Visit: Payer: Self-pay | Admitting: Internal Medicine

## 2022-10-09 ENCOUNTER — Ambulatory Visit (HOSPITAL_BASED_OUTPATIENT_CLINIC_OR_DEPARTMENT_OTHER)
Admission: RE | Admit: 2022-10-09 | Discharge: 2022-10-09 | Disposition: A | Discharge status: Home / Self Care | Payer: Medicare PPO | Source: Ambulatory Visit | Attending: Internal Medicine | Admitting: Internal Medicine

## 2022-10-09 ENCOUNTER — Ambulatory Visit (HOSPITAL_COMMUNITY)
Admission: RE | Admit: 2022-10-09 | Discharge: 2022-10-09 | Disposition: A | Discharge status: Home / Self Care | Payer: Medicare PPO | Source: Ambulatory Visit | Attending: Internal Medicine | Admitting: Internal Medicine

## 2022-10-09 DIAGNOSIS — J449 Chronic obstructive pulmonary disease, unspecified: Secondary | ICD-10-CM | POA: Diagnosis not present

## 2022-10-09 DIAGNOSIS — E7841 Elevated Lipoprotein(a): Secondary | ICD-10-CM | POA: Diagnosis not present

## 2022-10-09 DIAGNOSIS — I2510 Atherosclerotic heart disease of native coronary artery without angina pectoris: Secondary | ICD-10-CM | POA: Insufficient documentation

## 2022-10-09 DIAGNOSIS — R0609 Other forms of dyspnea: Secondary | ICD-10-CM | POA: Diagnosis not present

## 2022-10-09 DIAGNOSIS — I700 Atherosclerosis of aorta: Secondary | ICD-10-CM | POA: Diagnosis not present

## 2022-10-09 DIAGNOSIS — I4891 Unspecified atrial fibrillation: Secondary | ICD-10-CM

## 2022-10-09 DIAGNOSIS — M79604 Pain in right leg: Secondary | ICD-10-CM

## 2022-10-09 DIAGNOSIS — M79605 Pain in left leg: Secondary | ICD-10-CM

## 2022-10-09 LAB — VAS US ABI WITH/WO TBI
Left ABI: 1.08
Right ABI: 0.67

## 2022-10-10 ENCOUNTER — Ambulatory Visit (HOSPITAL_COMMUNITY): Payer: Medicare PPO | Attending: Internal Medicine

## 2022-10-10 DIAGNOSIS — I2510 Atherosclerotic heart disease of native coronary artery without angina pectoris: Secondary | ICD-10-CM | POA: Diagnosis not present

## 2022-10-10 DIAGNOSIS — R0609 Other forms of dyspnea: Secondary | ICD-10-CM | POA: Insufficient documentation

## 2022-10-10 LAB — MYOCARDIAL PERFUSION IMAGING
LV dias vol: 108 mL (ref 62–150)
LV sys vol: 52 mL
Nuc Stress EF: 52 %
Peak HR: 93 {beats}/min
Rest HR: 72 {beats}/min
Rest Nuclear Isotope Dose: 10.4 mCi
SDS: 12
SRS: 2
SSS: 14
ST Depression (mm): 0 mm
Stress Nuclear Isotope Dose: 30.2 mCi
TID: 1.32

## 2022-10-10 MED ORDER — REGADENOSON 0.4 MG/5ML IV SOLN
0.4000 mg | Freq: Once | INTRAVENOUS | Status: AC
  Administered 2022-10-10: 0.4 mg via INTRAVENOUS

## 2022-10-10 MED ORDER — TECHNETIUM TC 99M TETROFOSMIN IV KIT
10.4000 | PACK | Freq: Once | INTRAVENOUS | Status: AC | PRN
  Administered 2022-10-10: 10.4 via INTRAVENOUS

## 2022-10-10 MED ORDER — TECHNETIUM TC 99M TETROFOSMIN IV KIT
30.2000 | PACK | Freq: Once | INTRAVENOUS | Status: AC | PRN
  Administered 2022-10-10: 30.2 via INTRAVENOUS

## 2022-10-11 ENCOUNTER — Telehealth: Payer: Self-pay | Admitting: *Deleted

## 2022-10-11 DIAGNOSIS — Z01812 Encounter for preprocedural laboratory examination: Secondary | ICD-10-CM

## 2022-10-11 MED ORDER — ISOSORBIDE MONONITRATE ER 30 MG PO TB24: 30.0000 mg | ORAL_TABLET | Freq: Every day | ORAL | 3 refills | Status: DC

## 2022-10-11 MED ORDER — NITROGLYCERIN 0.4 MG SL SUBL: 0.4000 mg | SUBLINGUAL_TABLET | SUBLINGUAL | 3 refills | Status: AC | PRN

## 2022-10-11 NOTE — Telephone Encounter (Signed)
Reviewed results and recommendations with the patient who also viewed comments from Dr. Lynnette Caffey in the patient portal.    We arranged LHC with Dr. Lynnette Caffey for Thurs 10/17/22 at 12:00.  His EKG and H&P are up to date, he will have labs drawn when he come to appointment at [facility] office on Tue 10/15/22.  Reviewed instructions and sent through the portal for his review/reference.

## 2022-10-11 NOTE — Telephone Encounter (Signed)
-----   Message from Orbie Pyo sent at 10/11/2022  7:29 AM EDT ----- Let him know that his stress test was abnormal.  Given his symptoms, please set him up for cor angio, thanks.  Please start Imdur 30mg  qpm and NTG PRN as well.

## 2022-10-15 ENCOUNTER — Encounter: Payer: Self-pay | Admitting: Cardiovascular Disease

## 2022-10-15 ENCOUNTER — Ambulatory Visit: Payer: Medicare PPO | Attending: Cardiovascular Disease | Admitting: Cardiovascular Disease

## 2022-10-15 VITALS — BP 128/60 | HR 72 | Ht 72.0 in | Wt 162.6 lb

## 2022-10-15 DIAGNOSIS — Z01812 Encounter for preprocedural laboratory examination: Secondary | ICD-10-CM

## 2022-10-15 DIAGNOSIS — I739 Peripheral vascular disease, unspecified: Secondary | ICD-10-CM

## 2022-10-15 DIAGNOSIS — Z01818 Encounter for other preprocedural examination: Secondary | ICD-10-CM | POA: Diagnosis not present

## 2022-10-15 NOTE — Patient Instructions (Signed)
Medication Instructions:  Your physician recommends that you continue on your current medications as directed. Please refer to the Current Medication list given to you today.  *If you need a refill on your cardiac medications before your next appointment, please call your pharmacy*   Follow-Up: At [facility], you and your health needs are our priority.  As part of our continuing mission to provide you with exceptional heart care, we have created designated Provider Care Teams.  These Care Teams include your primary Cardiologist (physician) and Advanced Practice Providers (APPs -  Physician Assistants and Nurse Practitioners) who all work together to provide you with the care you need, when you need it.  We recommend signing up for the patient portal called "MyChart".  Sign up information is provided on this After Visit Summary.  MyChart is used to connect with patients for Virtual Visits (Telemedicine).  Patients are able to view lab/test results, encounter notes, upcoming appointments, etc.  Non-urgent messages can be sent to your provider as well.   To learn more about what you can do with MyChart, go to [email or web address].    Your next appointment:   We will see you on an as needed basis.  Provider:   Jonathan Berry, MD  

## 2022-10-15 NOTE — Progress Notes (Signed)
10/15/2022 KASETON SCITES   24-Jul-1950  [id number]  Primary Physician Blair Heys, MD Primary Cardiologist: Runell Gess MD Nicholes Calamity, [place]  HPI:  Adam Gilmore is a 72 y.o. thin-appearing married Caucasian male father of 2 children, grandfather of 5 grandchildren referred by Dr. Lynnette Caffey,  his primary cardiologist, for peripheral vascular evaluation.  He is retired from being a Electrical engineer.  His risk factors include over 50 pack years of tobacco abuse smoking 1 pack/day recalcitrant to risk factor modification.  He does have COPD.  He has treated hyperlipidemia as well as family history of heart disease with mother who had bypass surgery as well has his brother.  He has A-fib on Xarelto which she has been on for many years because of left lower extremity DVT.  He is experienced progressive dyspnea on exertion over the last several months with a Lexiscan Myoview that apparently was significant for ischemia.  Scheduled for left heart cath this coming Thursday.  Echo performed in March of last year showed normal LV systolic function.  He did have Doppler studies performed 10/09/2022 revealing a right ABI of 0.67 and a left of 1.08.  He had a high-frequency signal in his distal right SFA.   Current Meds  Medication Sig   acetaminophen (TYLENOL) 325 MG tablet Take 325 mg by mouth every 6 (six) hours as needed (for pain.).   albuterol (VENTOLIN HFA) 108 (90 Base) MCG/ACT inhaler Inhale 2 puffs into the lungs every 6 (six) hours as needed for wheezing or shortness of breath.   atorvastatin (LIPITOR) 40 MG tablet Take 1 tablet (40 mg total) by mouth daily.   fluticasone (FLONASE) 50 MCG/ACT nasal spray Place 2 sprays into both nostrils daily. (Patient taking differently: Place 2 sprays into both nostrils at bedtime.)   isosorbide mononitrate (IMDUR) 30 MG 24 hr tablet Take 1 tablet (30 mg total) by mouth at bedtime.   latanoprost (XALATAN) 0.005 % ophthalmic  solution Place 1 drop into both eyes at bedtime.   niacin 500 MG tablet Take 500 mg by mouth at bedtime.   nitroGLYCERIN (NITROSTAT) 0.4 MG SL tablet Place 1 tablet (0.4 mg total) under the tongue every 5 (five) minutes as needed for chest pain.   rivaroxaban (XARELTO) 20 MG TABS tablet Take 20 mg by mouth daily with supper.   vitamin C (ASCORBIC ACID) 500 MG tablet Take 500 mg by mouth daily.   vitamin E 180 MG (400 UNITS) capsule Take 400 Units by mouth daily.     Allergies  Allergen Reactions   Rosuvastatin     Other Reaction(s): sluggish    Social History   Socioeconomic History   Marital status: Married    Spouse name: Not on file   Number of children: Not on file   Years of education: Not on file   Highest education level: Not on file  Occupational History   Not on file  Tobacco Use   Smoking status: Every Day    Current packs/day: 1.00    Average packs/day: 1 pack/day for 56.0 years (56.0 ttl pk-yrs)    Types: Cigarettes   Smokeless tobacco: Never  Vaping Use   Vaping status: Never Used  Substance and Sexual Activity   Alcohol use: Yes    Comment: socially   Drug use: No   Sexual activity: Not Currently  Other Topics Concern   Not on file  Social History Narrative   Not on file  Social Determinants of Health   Financial Resource Strain: Not on file  Food Insecurity: Not on file  Transportation Needs: Not on file  Physical Activity: Not on file  Stress: Not on file  Social Connections: Unknown (07/28/2021)   Received from [facility], [facility]   Social Network    Social Network: Not on file  Intimate Partner Violence: Unknown (06/20/2021)   Received from [facility], [facility]   HITS    Physically Hurt: Not on file    Insult or Talk Down To: Not on file    Threaten Physical Harm: Not on file    Scream or Curse: Not on file     Review of Systems: General: negative for chills, fever, night sweats or weight changes.  Cardiovascular:  negative for chest pain, dyspnea on exertion, edema, orthopnea, palpitations, paroxysmal nocturnal dyspnea or shortness of breath Dermatological: negative for rash Respiratory: negative for cough or wheezing Urologic: negative for hematuria Abdominal: negative for nausea, vomiting, diarrhea, bright red blood per rectum, melena, or hematemesis Neurologic: negative for visual changes, syncope, or dizziness All other systems reviewed and are otherwise negative except as noted above.    Blood pressure 128/60, pulse 72, height 6' (1.829 m), weight 162 lb 9.6 oz (73.8 kg), SpO2 95%.  General appearance: alert and no distress Neck: no adenopathy, no carotid bruit, no JVD, supple, symmetrical, trachea midline, and thyroid not enlarged, symmetric, no tenderness/mass/nodules Lungs: clear to auscultation bilaterally Heart: Irregularly irregular Extremities: extremities normal, atraumatic, no cyanosis or edema Pulses: Decreased right pedal pulse Skin: Skin color, texture, turgor normal. No rashes or lesions Neurologic: Grossly normal  EKG not performed today      ASSESSMENT AND PLAN:   Peripheral arterial disease [facility]) Mr. Wilfred Curtis was referred to me by Dr. Lynnette Caffey for evaluation of PAD.  He had Doppler studies performed 10/09/2022 revealing a right ABI 0.67 and a left of 1.08.  He had a high-frequency signal in his distal right SFA.  He really denies claudication.  His major complaint is of numbness in the lateral aspect of his right hip/thigh which sounds neurologic as opposed to vascular.  At this point, I recommend conservative care with serial follow-up Dopplers on an annual basis.  I will see him back if he develops typical claudication symptoms.     Runell Gess MD FACP,FACC,FAHA, [facility] 10/15/2022 3:39 PM

## 2022-10-15 NOTE — Assessment & Plan Note (Signed)
[name] was referred to me by Dr. Lynnette Caffey for evaluation of PAD.  He had Doppler studies performed 10/09/2022 revealing a right ABI 0.67 and a left of 1.08.  He had a high-frequency signal in his distal right SFA.  He really denies claudication.  His major complaint is of numbness in the lateral aspect of his right hip/thigh which sounds neurologic as opposed to vascular.  At this point, I recommend conservative care with serial follow-up Dopplers on an annual basis.  I will see him back if he develops typical claudication symptoms.

## 2022-10-16 ENCOUNTER — Telehealth: Payer: Self-pay | Admitting: *Deleted

## 2022-10-16 NOTE — Telephone Encounter (Signed)
Cardiac Catheterization scheduled at [facility] for: Thursday October 17, 2022 12 Noon Arrival time [facility] Main Entrance A at: 10 AM  Nothing to eat after midnight prior to procedure, clear liquids until 5 AM day of procedure.  Medication instructions: -Hold:  Xarelto-none 10/15/22 until post procedure -Other usual morning medications can be taken with sips of water including aspirin 81 mg.  Confirmed patient has responsible adult to drive home post procedure and be with patient first 24 hours after arriving home.  Plan to go home the same day, you will only stay overnight if medically necessary.  Reviewed procedure instructions with patient.

## 2022-10-17 ENCOUNTER — Other Ambulatory Visit: Payer: Self-pay

## 2022-10-17 ENCOUNTER — Encounter (HOSPITAL_COMMUNITY)
Admission: RE | Disposition: A | Discharge status: Home / Self Care | Payer: Medicare PPO | Source: Home / Self Care | Attending: Internal Medicine

## 2022-10-17 ENCOUNTER — Ambulatory Visit (HOSPITAL_COMMUNITY)
Admission: RE | Admit: 2022-10-17 | Discharge: 2022-10-17 | Disposition: A | Discharge status: Home / Self Care | Payer: Medicare PPO | Attending: Internal Medicine | Admitting: Internal Medicine

## 2022-10-17 DIAGNOSIS — M79604 Pain in right leg: Secondary | ICD-10-CM | POA: Insufficient documentation

## 2022-10-17 DIAGNOSIS — Z7901 Long term (current) use of anticoagulants: Secondary | ICD-10-CM | POA: Diagnosis not present

## 2022-10-17 DIAGNOSIS — J432 Centrilobular emphysema: Secondary | ICD-10-CM | POA: Diagnosis not present

## 2022-10-17 DIAGNOSIS — F17210 Nicotine dependence, cigarettes, uncomplicated: Secondary | ICD-10-CM | POA: Diagnosis not present

## 2022-10-17 DIAGNOSIS — R0609 Other forms of dyspnea: Secondary | ICD-10-CM | POA: Insufficient documentation

## 2022-10-17 DIAGNOSIS — I2510 Atherosclerotic heart disease of native coronary artery without angina pectoris: Secondary | ICD-10-CM | POA: Diagnosis not present

## 2022-10-17 DIAGNOSIS — Z86718 Personal history of other venous thrombosis and embolism: Secondary | ICD-10-CM | POA: Diagnosis not present

## 2022-10-17 DIAGNOSIS — M79605 Pain in left leg: Secondary | ICD-10-CM | POA: Insufficient documentation

## 2022-10-17 DIAGNOSIS — I4891 Unspecified atrial fibrillation: Secondary | ICD-10-CM | POA: Diagnosis not present

## 2022-10-17 DIAGNOSIS — E785 Hyperlipidemia, unspecified: Secondary | ICD-10-CM | POA: Diagnosis not present

## 2022-10-17 DIAGNOSIS — I700 Atherosclerosis of aorta: Secondary | ICD-10-CM | POA: Diagnosis not present

## 2022-10-17 DIAGNOSIS — E7841 Elevated Lipoprotein(a): Secondary | ICD-10-CM | POA: Diagnosis not present

## 2022-10-17 HISTORY — PX: CORONARY ULTRASOUND/IVUS: CATH118244

## 2022-10-17 HISTORY — PX: CORONARY PRESSURE/FFR WITH 3D MAPPING: CATH118309

## 2022-10-17 HISTORY — PX: LEFT HEART CATH AND CORONARY ANGIOGRAPHY: CATH118249

## 2022-10-17 HISTORY — PX: CORONARY PRESSURE/FFR STUDY: CATH118243

## 2022-10-17 LAB — POCT ACTIVATED CLOTTING TIME: Activated Clotting Time: 238 seconds

## 2022-10-17 SURGERY — LEFT HEART CATH AND CORONARY ANGIOGRAPHY
Anesthesia: LOCAL

## 2022-10-17 MED ORDER — HEPARIN SODIUM (PORCINE) 1000 UNIT/ML IJ SOLN
INTRAMUSCULAR | Status: AC
  Filled 2022-10-17: qty 10

## 2022-10-17 MED ORDER — ATROPINE SULFATE 1 MG/10ML IJ SOSY
PREFILLED_SYRINGE | INTRAMUSCULAR | Status: DC | PRN
  Administered 2022-10-17: .5 mg via INTRAVENOUS

## 2022-10-17 MED ORDER — FENTANYL CITRATE (PF) 100 MCG/2ML IJ SOLN
INTRAMUSCULAR | Status: DC | PRN
  Administered 2022-10-17: 25 ug via INTRAVENOUS

## 2022-10-17 MED ORDER — HYDRALAZINE HCL 20 MG/ML IJ SOLN: 10.0000 mg | INTRAMUSCULAR | Status: DC | PRN

## 2022-10-17 MED ORDER — LABETALOL HCL 5 MG/ML IV SOLN: 10.0000 mg | INTRAVENOUS | Status: DC | PRN

## 2022-10-17 MED ORDER — MIDAZOLAM HCL 2 MG/2ML IJ SOLN
INTRAMUSCULAR | Status: DC | PRN
  Administered 2022-10-17: 1 mg via INTRAVENOUS

## 2022-10-17 MED ORDER — ATROPINE SULFATE 1 MG/10ML IJ SOSY
PREFILLED_SYRINGE | INTRAMUSCULAR | Status: AC
  Filled 2022-10-17: qty 10

## 2022-10-17 MED ORDER — SODIUM CHLORIDE 0.9 % WEIGHT BASED INFUSION: 1.0000 mL/kg/h | INTRAVENOUS | Status: DC

## 2022-10-17 MED ORDER — LIDOCAINE HCL (PF) 1 % IJ SOLN
INTRAMUSCULAR | Status: AC
  Filled 2022-10-17: qty 30

## 2022-10-17 MED ORDER — SODIUM CHLORIDE 0.9 % IV SOLN
INTRAVENOUS | Status: DC | PRN
  Administered 2022-10-17: 999 mL/h via INTRAVENOUS

## 2022-10-17 MED ORDER — FENTANYL CITRATE (PF) 100 MCG/2ML IJ SOLN
INTRAMUSCULAR | Status: AC
  Filled 2022-10-17: qty 2

## 2022-10-17 MED ORDER — SODIUM CHLORIDE 0.9 % IV SOLN: INTRAVENOUS | Status: DC

## 2022-10-17 MED ORDER — LIDOCAINE HCL (PF) 1 % IJ SOLN
INTRAMUSCULAR | Status: DC | PRN
  Administered 2022-10-17: 2 mL

## 2022-10-17 MED ORDER — HEPARIN (PORCINE) IN NACL 1000-0.9 UT/500ML-% IV SOLN
INTRAVENOUS | Status: DC | PRN
  Administered 2022-10-17 (×2): 500 mL

## 2022-10-17 MED ORDER — IOHEXOL 350 MG/ML SOLN
INTRAVENOUS | Status: DC | PRN
  Administered 2022-10-17: 82 mL

## 2022-10-17 MED ORDER — HEPARIN SODIUM (PORCINE) 1000 UNIT/ML IJ SOLN
INTRAMUSCULAR | Status: DC | PRN
  Administered 2022-10-17: 5000 [IU] via INTRAVENOUS
  Administered 2022-10-17: 1000 [IU] via INTRAVENOUS
  Administered 2022-10-17: 3000 [IU] via INTRAVENOUS

## 2022-10-17 MED ORDER — VERAPAMIL HCL 2.5 MG/ML IV SOLN
INTRAVENOUS | Status: DC | PRN
  Administered 2022-10-17: 10 mL via INTRA_ARTERIAL

## 2022-10-17 MED ORDER — ONDANSETRON HCL 4 MG/2ML IJ SOLN: 4.0000 mg | Freq: Four times a day (QID) | INTRAMUSCULAR | Status: DC | PRN

## 2022-10-17 MED ORDER — ASPIRIN 81 MG PO CHEW: 81.0000 mg | CHEWABLE_TABLET | ORAL | Status: DC

## 2022-10-17 MED ORDER — MIDAZOLAM HCL 2 MG/2ML IJ SOLN
INTRAMUSCULAR | Status: AC
  Filled 2022-10-17: qty 2

## 2022-10-17 MED ORDER — SODIUM CHLORIDE 0.9% FLUSH: 3.0000 mL | INTRAVENOUS | Status: DC | PRN

## 2022-10-17 MED ORDER — SODIUM CHLORIDE 0.9 % IV SOLN: 250.0000 mL | INTRAVENOUS | Status: DC | PRN

## 2022-10-17 MED ORDER — VERAPAMIL HCL 2.5 MG/ML IV SOLN
INTRAVENOUS | Status: AC
  Filled 2022-10-17: qty 2

## 2022-10-17 MED ORDER — SODIUM CHLORIDE 0.9 % WEIGHT BASED INFUSION
3.0000 mL/kg/h | INTRAVENOUS | Status: DC
  Administered 2022-10-17: 3 mL/kg/h via INTRAVENOUS

## 2022-10-17 MED ORDER — ACETAMINOPHEN 325 MG PO TABS: 650.0000 mg | ORAL_TABLET | ORAL | Status: DC | PRN

## 2022-10-17 MED ORDER — SODIUM CHLORIDE 0.9% FLUSH: 3.0000 mL | Freq: Two times a day (BID) | INTRAVENOUS | Status: DC

## 2022-10-17 SURGICAL SUPPLY — 13 items
CATH DIAG 6FR PIGTAIL ANGLED (CATHETERS) IMPLANT
CATH INFINITI AMBI 6FR TG (CATHETERS) IMPLANT
CATH LAUNCHER 6FR JL3.5 (CATHETERS) IMPLANT
CATH LAUNCHER 6FR JR3.5 (CATHETERS) IMPLANT
CATH OPTICROSS HD (CATHETERS) IMPLANT
DEVICE RAD COMP TR BAND LRG (VASCULAR PRODUCTS) IMPLANT
GLIDESHEATH SLEND SS 6F .021 (SHEATH) IMPLANT
GUIDEWIRE PRESSURE X 175 (WIRE) IMPLANT
KIT ESSENTIALS PG (KITS) IMPLANT
PACK CARDIAC CATHETERIZATION (CUSTOM PROCEDURE TRAY) ×2 IMPLANT
SET ATX-X65L (MISCELLANEOUS) IMPLANT
SLED PULL BACK IVUS (MISCELLANEOUS) IMPLANT
WIRE EMERALD 3MM-J .035X260CM (WIRE) IMPLANT

## 2022-10-17 NOTE — Progress Notes (Signed)
Pt ambulated without difficulty or bleeding.  Discharge instructions given to pt and wife  who verbalize understanding and deny further questions.  Discharged home with  wife who will drive and stay with pt x 24 hrs.

## 2022-10-17 NOTE — Discharge Instructions (Addendum)
Restart Xarelto on 10/18/22  Drink plenty of fluids for 48 hours and keep wrist elevated at heart level for 24 hours  Radial Site Care   This sheet gives you information about how to care for yourself after your procedure. Your health care provider may also give you more specific instructions. If you have problems or questions, contact your health care provider. What can I expect after the procedure? After the procedure, it is common to have: Bruising and tenderness at the catheter insertion area. Follow these instructions at home: Medicines Take over-the-counter and prescription medicines only as told by your health care provider. Insertion site care Follow instructions from your health care provider about how to take care of your insertion site. Make sure you: Wash your hands with soap and water before you change your bandage (dressing). If soap and water are not available, use hand sanitizer. Remove your dressing as told by your health care provider. In 24 hours Check your insertion site every day for signs of infection. Check for: Redness, swelling, or pain. Fluid or blood. Pus or a bad smell. Warmth. Do not take baths, swim, or use a hot tub until your health care provider approves. You may shower 24-48 hours after the procedure, or as directed by your health care provider. Remove the dressing and gently wash the site with plain soap and water. Pat the area dry with a clean towel. Do not rub the site. That could cause bleeding. Do not apply powder or lotion to the site. Activity   For 24 hours after the procedure, or as directed by your health care provider: Do not flex or bend the affected arm. Do not push or pull heavy objects with the affected arm. Do not drive yourself home from the hospital or clinic. You may drive 24 hours after the procedure unless your health care provider tells you not to. Do not operate machinery or power tools. Do not lift anything that is heavier  than 10 lb (4.5 kg), or the limit that you are told, until your health care provider says that it is safe.  For 4 days Ask your health care provider when it is okay to: Return to work or school. Resume usual physical activities or sports. Resume sexual activity. General instructions If the catheter site starts to bleed, raise your arm and put firm pressure on the site. If the bleeding does not stop, get help right away. This is a medical emergency. If you went home on the same day as your procedure, a responsible adult should be with you for the first 24 hours after you arrive home. Keep all follow-up visits as told by your health care provider. This is important. Contact a health care provider if: You have a fever. You have redness, swelling, or yellow drainage around your insertion site. Get help right away if: You have unusual pain at the radial site. The catheter insertion area swells very fast. The insertion area is bleeding, and the bleeding does not stop when you hold steady pressure on the area. Your arm or hand becomes pale, cool, tingly, or numb. These symptoms may represent a serious problem that is an emergency. Do not wait to see if the symptoms will go away. Get medical help right away. Call your local emergency services (911 in the [place] Do not drive yourself to the hospital. Summary After the procedure, it is common to have bruising and tenderness at the site. Follow instructions from your health care provider about how  to take care of your radial site wound. Check the wound every day for signs of infection. Do not lift anything that is heavier than 10 lb (4.5 kg), or the limit that you are told, until your health care provider says that it is safe. This information is not intended to replace advice given to you by your health care provider. Make sure you discuss any questions you have with your health care provider. Document Revised: 04/09/2017 Document Reviewed:  04/09/2017 Elsevier Patient Education  2020 [employer].

## 2022-10-17 NOTE — Interval H&P Note (Signed)
History and Physical Interval Note:  10/17/2022 10:31 AM  Adam Gilmore  has presented today for surgery, with the diagnosis of dynesa - abnormal nuc.  The various methods of treatment have been discussed with the patient and family. After consideration of risks, benefits and other options for treatment, the patient has consented to  Procedure(s): LEFT HEART CATH AND CORONARY ANGIOGRAPHY (N/A) as a surgical intervention.  The patient's history has been reviewed, patient examined, no change in status, stable for surgery.  I have reviewed the patient's chart and labs.  Questions were answered to the patient's satisfaction.    Cath Lab Visit (complete for each Cath Lab visit)  Clinical Evaluation Leading to the Procedure:   ACS: No.  Non-ACS:    Anginal Classification: CCS III  Anti-ischemic medical therapy: Maximal Therapy (2 or more classes of medications)  Non-Invasive Test Results: High-risk stress test findings: cardiac mortality >3%/year  Prior CABG: No previous CABG        Orbie Pyo

## 2022-10-18 ENCOUNTER — Ambulatory Visit (HOSPITAL_COMMUNITY): Payer: Medicare PPO

## 2022-10-18 ENCOUNTER — Encounter (HOSPITAL_COMMUNITY): Payer: Self-pay | Admitting: Internal Medicine

## 2022-10-23 ENCOUNTER — Ambulatory Visit (HOSPITAL_COMMUNITY): Payer: Medicare PPO | Attending: Cardiovascular Disease

## 2022-10-23 DIAGNOSIS — M79605 Pain in left leg: Secondary | ICD-10-CM | POA: Diagnosis not present

## 2022-10-23 DIAGNOSIS — I700 Atherosclerosis of aorta: Secondary | ICD-10-CM | POA: Diagnosis not present

## 2022-10-23 DIAGNOSIS — I2510 Atherosclerotic heart disease of native coronary artery without angina pectoris: Secondary | ICD-10-CM

## 2022-10-23 DIAGNOSIS — R0609 Other forms of dyspnea: Secondary | ICD-10-CM

## 2022-10-23 DIAGNOSIS — M79604 Pain in right leg: Secondary | ICD-10-CM | POA: Diagnosis not present

## 2022-10-23 DIAGNOSIS — I4891 Unspecified atrial fibrillation: Secondary | ICD-10-CM | POA: Diagnosis not present

## 2022-10-23 DIAGNOSIS — E7841 Elevated Lipoprotein(a): Secondary | ICD-10-CM

## 2022-10-23 DIAGNOSIS — J449 Chronic obstructive pulmonary disease, unspecified: Secondary | ICD-10-CM | POA: Diagnosis not present

## 2022-10-23 LAB — ECHOCARDIOGRAM COMPLETE
AR max vel: 2.06 cm2
AV Area VTI: 1.99 cm2
AV Area mean vel: 1.84 cm2
AV Mean grad: 3.9 mmHg
AV Peak grad: 7.8 mmHg
Ao pk vel: 1.4 m/s
Area-P 1/2: 2.67 cm2
S' Lateral: 3.3 cm

## 2022-11-06 ENCOUNTER — Encounter (HOSPITAL_COMMUNITY): Payer: Self-pay | Admitting: Cardiology

## 2022-11-19 ENCOUNTER — Ambulatory Visit: Payer: Medicare PPO | Admitting: Pulmonary Disease

## 2022-11-19 ENCOUNTER — Encounter: Payer: Self-pay | Admitting: Pulmonary Disease

## 2022-11-19 VITALS — BP 120/64 | HR 69 | Temp 97.7°F | Ht 72.0 in | Wt 162.2 lb

## 2022-11-19 DIAGNOSIS — J431 Panlobular emphysema: Secondary | ICD-10-CM | POA: Diagnosis not present

## 2022-11-19 DIAGNOSIS — F17210 Nicotine dependence, cigarettes, uncomplicated: Secondary | ICD-10-CM

## 2022-11-19 MED ORDER — TRELEGY ELLIPTA 200-62.5-25 MCG/ACT IN AEPB: 1.0000 | INHALATION_SPRAY | Freq: Every day | RESPIRATORY_TRACT | Status: DC

## 2022-11-19 MED ORDER — TRELEGY ELLIPTA 100-62.5-25 MCG/ACT IN AEPB: 1.0000 | INHALATION_SPRAY | Freq: Every day | RESPIRATORY_TRACT | 3 refills | Status: DC

## 2022-11-19 NOTE — Patient Instructions (Signed)
I will see you in about 3 months  Prescription for Trelegy sent to pharmacy for you  We will give you a sample of Trelegy to try-Trelegy 200  Use it once daily Make sure you rinse after use  Call us with significant concerns  Graded activities as tolerated  Check into respiratory muscle training as, this may help shortness of breath

## 2022-11-19 NOTE — Progress Notes (Signed)
Adam Gilmore    [id number]    1950/11/10  Primary Care Physician:Ehinger, Molly Maduro, MD  Referring Physician: Blair Heys, MD [address] [place],  [place] [address]  Chief complaint:   Patient being seen for shortness of breath  HPI:  Patient with known chronic obstructive pulmonary disease Had a CT scan in January showing extensive emphysema Repeat CT scan of the chest shows stable findings, extensive emphysema  Most recent PFT showing severe obstructive disease with severely reduced diffusing capacity  56-pack-year smoking history Is cutting down on smoking but continues to smoke  Stopped taking Breztri because he felt it was not working  Shortness of breath with mild to moderate activity Feels it is worse today Symptoms did get worse following a COVID infection  Walking up steps makes it worse Stopping and resting makes it better Denies any chest pains or chest discomfort  Persistent mucus production clear mucus  History of blood clots History of prostate cancer  Outpatient Encounter Medications as of 11/19/2022  Medication Sig   acetaminophen (TYLENOL) 325 MG tablet Take 325 mg by mouth every 6 (six) hours as needed (for pain.).   albuterol (VENTOLIN HFA) 108 (90 Base) MCG/ACT inhaler Inhale 2 puffs into the lungs every 6 (six) hours as needed for wheezing or shortness of breath.   atorvastatin (LIPITOR) 40 MG tablet Take 1 tablet (40 mg total) by mouth daily.   fluticasone (FLONASE) 50 MCG/ACT nasal spray Place 2 sprays into both nostrils daily. (Patient taking differently: Place 2 sprays into both nostrils at bedtime.)   isosorbide mononitrate (IMDUR) 30 MG 24 hr tablet Take 1 tablet (30 mg total) by mouth at bedtime.   latanoprost (XALATAN) 0.005 % ophthalmic solution Place 1 drop into both eyes at bedtime.   niacin 500 MG tablet Take 500 mg by mouth at bedtime.   nitroGLYCERIN (NITROSTAT) 0.4 MG SL tablet Place 1 tablet (0.4 mg  total) under the tongue every 5 (five) minutes as needed for chest pain.   rivaroxaban (XARELTO) 20 MG TABS tablet Take 20 mg by mouth daily with supper.   vitamin C (ASCORBIC ACID) 500 MG tablet Take 500 mg by mouth daily.   vitamin E 180 MG (400 UNITS) capsule Take 400 Units by mouth daily.   No facility-administered encounter medications on file as of 11/19/2022.    Allergies as of 11/19/2022 - Review Complete 11/19/2022  Allergen Reaction Noted   Rosuvastatin  10/15/2022    Past Medical History:  Diagnosis Date   Adenomatous colon polyp    Centrilobular emphysema (HCC)    COPD (chronic obstructive pulmonary disease) (HCC)    History of DVT of lower extremity    Hypercholesterolemia    Hypercholesterolemia    prostate ca dx'd 03/2012   surg only   Skin cancer    Tobacco dependence     Past Surgical History:  Procedure Laterality Date   APPENDECTOMY     June 14, 2015   COLONOSCOPY  1/14   CORONARY PRESSURE/FFR STUDY N/A 10/17/2022   Procedure: CORONARY PRESSURE/FFR STUDY;  Surgeon: Orbie Pyo, MD;  Location: [facility];  Service: Cardiovascular;  Laterality: N/A;   CORONARY PRESSURE/FFR WITH 3D MAPPING N/A 10/17/2022   Procedure: Coronary Pressure/FFR w/3D Mapping;  Surgeon: Orbie Pyo, MD;  Location: [facility];  Service: Cardiovascular;  Laterality: N/A;   CORONARY ULTRASOUND/IVUS N/A 10/17/2022   Procedure: Coronary Ultrasound/IVUS;  Surgeon: Lynnette Caffey,  Charlies Constable, MD;  Location: [facility];  Service: Cardiovascular;  Laterality: N/A;   INSERTION OF MESH N/A 05/22/2016   Procedure: INSERTION OF MESH;  Surgeon: Berna Bue, MD;  Location: [facility];  Service: General;  Laterality: N/A;   LEFT HEART CATH AND CORONARY ANGIOGRAPHY N/A 10/17/2022   Procedure: LEFT HEART CATH AND CORONARY ANGIOGRAPHY;  Surgeon: Orbie Pyo, MD;  [facility]: [facility];  Service: Cardiovascular;  Laterality: N/A;   LYMPHADENECTOMY Bilateral 07/09/2012    Procedure: LYMPHADENECTOMY;  Surgeon: Crecencio Mc, MD;  Location: [facility];  Service: Urology;  Laterality: Bilateral;   ROBOT ASSISTED LAPAROSCOPIC RADICAL PROSTATECTOMY N/A 07/09/2012   Procedure: ROBOTIC ASSISTED LAPAROSCOPIC RADICAL PROSTATECTOMY LEVEL 2;  Surgeon: Crecencio Mc, MD;  Location: [facility];  Service: Urology;  Laterality: N/A;   TONSILLECTOMY     VENTRAL HERNIA REPAIR N/A 05/22/2016   Procedure: LAPAROSCOPIC VENTRAL HERNIA REPAIR WITH MESH;  Surgeon: Berna Bue, MD;  Location: [facility];  Service: General;  Laterality: N/A;    Family History  Problem Relation Age of Onset   CAD Mother    CVA Mother    Cancer Father        prostate cancer   CAD Brother    Cancer Brother        prostate cancer     Social History   Socioeconomic History   Marital status: Married    Spouse name: Not on file   Number of children: Not on file   Years of education: Not on file   Highest education level: Not on file  Occupational History   Not on file  Tobacco Use   Smoking status: Every Day    Current packs/day: 1.00    Average packs/day: 1 pack/day for 56.0 years (56.0 ttl pk-yrs)    Types: Cigarettes   Smokeless tobacco: Never  Vaping Use   Vaping status: Never Used  Substance and Sexual Activity   Alcohol use: Yes    Comment: socially   Drug use: No   Sexual activity: Not Currently  Other Topics Concern   Not on file  Social History Narrative   Not on file   Social Determinants of Health   Financial Resource Strain: Not on file  Food Insecurity: Not on file  Transportation Needs: Not on file  Physical Activity: Not on file  Stress: Not on file  Social Connections: Unknown (07/28/2021)   Received from [facility], [facility]   Social Network    Social Network: Not on file  Intimate Partner Violence: Unknown (06/20/2021)   Received from [facility], [facility]   HITS    Physically Hurt: Not on file    Insult or Talk Down To: Not on file    Threaten Physical  Harm: Not on file    Scream or Curse: Not on file    Review of Systems  Respiratory:  Positive for cough and shortness of breath.     Vitals:   11/19/22 1425  BP: 120/64  Pulse: 69  Temp: 97.7 F (36.5 C)  SpO2: 95%     Physical Exam Constitutional:      Appearance: Normal appearance.  HENT:     Head: Normocephalic.     Mouth/Throat:     Mouth: Mucous membranes are moist.  Eyes:     Conjunctiva/sclera: Conjunctivae normal.  Cardiovascular:     Rate and Rhythm: Normal rate and regular rhythm.     Heart sounds: No murmur  heard.    No friction rub.  Pulmonary:     Effort: No respiratory distress.     Breath sounds: No stridor. No wheezing or rhonchi.  Musculoskeletal:     Cervical back: No rigidity or tenderness.  Neurological:     Mental Status: He is alert.  Psychiatric:        Mood and Affect: Mood normal.    Data Reviewed: CT scan of the chest from 04/02/2021 reviewed with the patient showing extensive emphysema, some scarring right upper lobe  PFT reviewed with patient showing severe obstructive disease with severely reduced diffusing capacity  Assessment:  Advanced chronic obstructive pulmonary disease  Gold stage III COPD  Worsening shortness of breath  Felt he did not benefit from using Breztri  Active smoker -Smoking cessation counseling  Plan/Recommendations: Sample of Trelegy 200 provided  Will provide a prescription for Trelegy 200  Encourage rescue inhaler use as needed  Smoking cessation counseling  Graded activities as tolerated  Follow-up in 3 months  Encouraged to check into respiratory muscle trainers-we discussed about expiratory and inspiratory muscle training in addition to graded activities   Virl Diamond MD [facility] 11/19/2022, 2:45 PM  CC: Blair Heys, MD

## 2022-11-29 ENCOUNTER — Ambulatory Visit: Payer: Medicare PPO | Attending: Internal Medicine

## 2022-11-29 DIAGNOSIS — I2510 Atherosclerotic heart disease of native coronary artery without angina pectoris: Secondary | ICD-10-CM

## 2022-11-29 DIAGNOSIS — M79605 Pain in left leg: Secondary | ICD-10-CM | POA: Diagnosis not present

## 2022-11-29 DIAGNOSIS — R0609 Other forms of dyspnea: Secondary | ICD-10-CM

## 2022-11-29 DIAGNOSIS — I700 Atherosclerosis of aorta: Secondary | ICD-10-CM | POA: Diagnosis not present

## 2022-11-29 DIAGNOSIS — J449 Chronic obstructive pulmonary disease, unspecified: Secondary | ICD-10-CM | POA: Diagnosis not present

## 2022-11-29 DIAGNOSIS — M79604 Pain in right leg: Secondary | ICD-10-CM

## 2022-11-29 DIAGNOSIS — E7841 Elevated Lipoprotein(a): Secondary | ICD-10-CM | POA: Diagnosis not present

## 2022-11-29 DIAGNOSIS — I4891 Unspecified atrial fibrillation: Secondary | ICD-10-CM | POA: Diagnosis not present

## 2022-11-30 LAB — HEPATIC FUNCTION PANEL
ALT: 13 IU/L (ref 0–44)
AST: 12 IU/L (ref 0–40)
Albumin: 3.9 g/dL (ref 3.8–4.8)
Alkaline Phosphatase: 62 IU/L (ref 44–121)
Bilirubin Total: 0.2 mg/dL (ref 0.0–1.2)
Bilirubin, Direct: 0.11 mg/dL (ref 0.00–0.40)
Total Protein: 5.7 g/dL — ABNORMAL LOW (ref 6.0–8.5)

## 2022-11-30 LAB — LIPID PANEL
Chol/HDL Ratio: 2.4 ratio (ref 0.0–5.0)
Cholesterol, Total: 113 mg/dL (ref 100–199)
HDL: 47 mg/dL (ref >39)
LDL Chol Calc (NIH): 52 mg/dL (ref 0–99)
Triglycerides: 62 mg/dL (ref 0–149)
VLDL Cholesterol Cal: 14 mg/dL (ref 5–40)

## 2022-12-27 ENCOUNTER — Ambulatory Visit: Payer: Medicare PPO | Admitting: Nurse Practitioner

## 2023-01-06 DIAGNOSIS — Z8546 Personal history of malignant neoplasm of prostate: Secondary | ICD-10-CM | POA: Diagnosis not present

## 2023-01-06 DIAGNOSIS — Z08 Encounter for follow-up examination after completed treatment for malignant neoplasm: Secondary | ICD-10-CM | POA: Diagnosis not present

## 2023-01-06 DIAGNOSIS — I700 Atherosclerosis of aorta: Secondary | ICD-10-CM | POA: Diagnosis not present

## 2023-01-06 DIAGNOSIS — D6869 Other thrombophilia: Secondary | ICD-10-CM | POA: Diagnosis not present

## 2023-01-06 DIAGNOSIS — E7800 Pure hypercholesterolemia, unspecified: Secondary | ICD-10-CM | POA: Diagnosis not present

## 2023-01-06 DIAGNOSIS — Z23 Encounter for immunization: Secondary | ICD-10-CM | POA: Diagnosis not present

## 2023-01-06 DIAGNOSIS — I4891 Unspecified atrial fibrillation: Secondary | ICD-10-CM | POA: Diagnosis not present

## 2023-01-06 DIAGNOSIS — Z86718 Personal history of other venous thrombosis and embolism: Secondary | ICD-10-CM | POA: Diagnosis not present

## 2023-01-06 DIAGNOSIS — Z0000 Encounter for general adult medical examination without abnormal findings: Secondary | ICD-10-CM | POA: Diagnosis not present

## 2023-01-06 DIAGNOSIS — J439 Emphysema, unspecified: Secondary | ICD-10-CM | POA: Diagnosis not present

## 2023-02-24 ENCOUNTER — Ambulatory Visit: Payer: Medicare PPO | Admitting: Cardiology

## 2023-02-26 ENCOUNTER — Ambulatory Visit: Payer: Medicare PPO | Attending: Nurse Practitioner | Admitting: Physician Assistant

## 2023-02-26 ENCOUNTER — Encounter: Payer: Self-pay | Admitting: Physician Assistant

## 2023-02-26 ENCOUNTER — Other Ambulatory Visit: Payer: Self-pay | Admitting: *Deleted

## 2023-02-26 VITALS — BP 120/60 | HR 72 | Ht 72.0 in | Wt 163.4 lb

## 2023-02-26 DIAGNOSIS — E7800 Pure hypercholesterolemia, unspecified: Secondary | ICD-10-CM | POA: Diagnosis not present

## 2023-02-26 DIAGNOSIS — Z720 Tobacco use: Secondary | ICD-10-CM | POA: Diagnosis not present

## 2023-02-26 DIAGNOSIS — I2510 Atherosclerotic heart disease of native coronary artery without angina pectoris: Secondary | ICD-10-CM

## 2023-02-26 DIAGNOSIS — I480 Paroxysmal atrial fibrillation: Secondary | ICD-10-CM | POA: Diagnosis not present

## 2023-02-26 DIAGNOSIS — I739 Peripheral vascular disease, unspecified: Secondary | ICD-10-CM | POA: Diagnosis not present

## 2023-02-26 DIAGNOSIS — I6523 Occlusion and stenosis of bilateral carotid arteries: Secondary | ICD-10-CM | POA: Insufficient documentation

## 2023-02-26 NOTE — Progress Notes (Signed)
Cardiology Office Note:    Date:  02/26/2023  ID:  Adam Gilmore, DOB 02/26/1951, MRN [id number] PCP: Blair Heys, MD (Inactive)  [facility] Providers Cardiologist:  Orbie Pyo, MD       Patient Profile:      Coronary artery disease  Myoview 10/10/22: inf infarct w peri-infarct ischemia, EF 52; int risk  LHC 10/17/2022: mRCA diffuse 50, mLM 60 (RFR 1.0) >> Med Rx TTE 10/23/22: EF 55-60, no RWMA, mild LVH, Gr 1 DD, NL RVSF, mod AV Ca2+, AV sclerosis, RAP 3  Paroxysmal atrial fibrillation  Monitor 09/2022: SVT x 1 (6 beats); PACs 2.4%; PVCs 3.2%; no AF Peripheral arterial disease  [place] 06/14/21: no AAA ABIs 10/09/22: R 0.67; L 1.08 // [place] R dist SFA 75-99; L mid SFA 30-49 PV eval (Dr. Allyson Sabal) 09/2022 >> no claudication - conservative Rx unless +claudication  Carotid artery disease Vascuscreen 10/09/22: mod RICA plaque, mild LICA plaque Hyperlipidemia  Lp(a) 08/2021: 118.8 Chronic Obstructive Pulmonary Disease  Recurrent DVT  Prostate CA  FHx of CAD         History of Present Illness:  Discussed the use of AI scribe software for clinical note transcription with the patient, who gave verbal consent to proceed.  Adam Gilmore is a 72 y.o. male who returns for follow up of CAD, AF, HL. He was last seen by Dr. Lynnette Caffey in 09/2022. Myoview was done for dyspnea on exertion. This was abnormal and he was set up for cath. He had mod nonobstructive disease in the LM and RCA. Med Rx was recommended. He is here alone. He notes chronic left leg swelling his DVT several years ago. The patient has noticed rare chest tightness, particularly after strenuous activities or climbing stairs. The tightness is described as being located in the left side of the chest. The patient has not noticed a correlation between the chest tightness and the need to rest while climbing stairs. The patient denies any worsening of symptoms since the heart catheterization. The patient is a current smoker, but only  smokes when idle. The patient denies any issues with breathing when lying flat but prefers to sleep on his side. The patient denies any episodes of passing out. The patient's left-sided chest tightness is rare and has occurred two to three times since the last visit. The patient describes the sensation as more of a muscle soreness rather than cardiac in nature.      ROS   See HPI    Studies Reviewed:        Results   LABS LDL: 52 mg/dL (21/3086) LDL: 65 mg/dL (57/8469) Lp(a):  629 (6.29.23)     Risk Assessment/Calculations:             Physical Exam:   VS:  BP 120/60   Pulse 72   Ht 6' (1.829 m)   Wt 163 lb 6.4 oz (74.1 kg)   SpO2 95%   BMI 22.16 kg/m    Wt Readings from Last 3 Encounters:  02/26/23 163 lb 6.4 oz (74.1 kg)  11/19/22 162 lb 3.2 oz (73.6 kg)  10/17/22 162 lb (73.5 kg)    Constitutional:      Appearance: Healthy appearance. Not in distress.  Neck:     Vascular: No JVR. JVD normal.  Pulmonary:     Breath sounds: Normal breath sounds. No wheezing. No rales.  Cardiovascular:     Normal rate. Regular rhythm.     Murmurs: There is no  murmur.  Edema:    Peripheral edema present.    Pretibial: bilateral trace edema of the pretibial area. Abdominal:     Palpations: Abdomen is soft.        Assessment and Plan:   Assessment & Plan Coronary artery disease involving native coronary artery of native heart without angina pectoris Cardiac catheterization in 10/2022 with mod nonobstructive CAD in the LM and RCA. Med Rx was recommended. He is doing well w/o chest pain to suggest angina. He does have some L sided pain at times that sounds c/w MSK chest pain.  -Continue Atorvastatin 40 mg once daily -Continue Imdur 30 mg once daily, NTG prn -Follow up 6 mos Pure hypercholesterolemia LDL in 11/2022 was at goal. LDL per [facility] in 12/2022 was 65. Goal is < 55. Will need to continue to monitor this. Would have low threshold to send to PharmD to start PCSK9i if LDL remains  > 55 esp with known elevated Lp(a).  -Continue Atorvastatin 40 mg once daily  -CMET, Lipids in 6 mos prior to next OV Paroxysmal atrial fibrillation (HCC) Hx of possible atrial fibrillation on prior EKG. Follow up monitor with no AFib. Pt with regular rhythm on exam today. He is already on long term anticoagulation with Xarelto with hx of DVT.  Peripheral arterial disease (HCC) Bilateral SFA disease, especially on the L noted on prior US. PV eval completed with Dr. Allyson Sabal. Pt is managed conservatively unless he develops claudication. He is currently doing well w/o symptoms of claudication.  Bilateral carotid artery stenosis Mod RICA plaque on Vascuscreen this year. Consider Carotid US for follow up in 1 year.  Tobacco abuse We discussed the importance of quitting.        Dispo:  Return in about 6 months (around 08/27/2023) for Routine Follow Up, w/ Dr. Lynnette Caffey.  Signed, Tereso Newcomer, PA-C

## 2023-02-26 NOTE — Assessment & Plan Note (Signed)
LDL in 11/2022 was at goal. LDL per [facility] in 12/2022 was 65. Goal is < 55. Will need to continue to monitor this. Would have low threshold to send to PharmD to start PCSK9i if LDL remains > 55 esp with known elevated Lp(a).  -Continue Atorvastatin 40 mg once daily  -CMET, Lipids in 6 mos prior to next OV

## 2023-02-26 NOTE — Assessment & Plan Note (Signed)
Hx of possible atrial fibrillation on prior EKG. Follow up monitor with no AFib. Pt with regular rhythm on exam today. He is already on long term anticoagulation with Xarelto with hx of DVT.

## 2023-02-26 NOTE — Assessment & Plan Note (Signed)
We discussed the importance of quitting. 

## 2023-02-26 NOTE — Assessment & Plan Note (Signed)
Mod RICA plaque on Vascuscreen this year. Consider Carotid US for follow up in 1 year.

## 2023-02-26 NOTE — Assessment & Plan Note (Signed)
Bilateral SFA disease, especially on the L noted on prior US. PV eval completed with Dr. Allyson Sabal. Pt is managed conservatively unless he develops claudication. He is currently doing well w/o symptoms of claudication.

## 2023-02-26 NOTE — Patient Instructions (Signed)
Medication Instructions:  Your physician recommends that you continue on your current medications as directed. Please refer to the Current Medication list given to you today.  *If you need a refill on your cardiac medications before your next appointment, please call your pharmacy*   Lab Work: 1 WEEK BEFORE APPT IN 6 MONTHS, GET FASTING:  LIPID & CMET  If you have labs (blood work) drawn today and your tests are completely normal, you will receive your results only by: MyChart Message (if you have MyChart) OR A paper copy in the mail If you have any lab test that is abnormal or we need to change your treatment, we will call you to review the results.   Testing/Procedures: None ordered   Follow-Up: At [facility], you and your health needs are our priority.  As part of our continuing mission to provide you with exceptional heart care, we have created designated Provider Care Teams.  These Care Teams include your primary Cardiologist (physician) and Advanced Practice Providers (APPs -  Physician Assistants and Nurse Practitioners) who all work together to provide you with the care you need, when you need it.  We recommend signing up for the patient portal called "MyChart".  Sign up information is provided on this After Visit Summary.  MyChart is used to connect with patients for Virtual Visits (Telemedicine).  Patients are able to view lab/test results, encounter notes, upcoming appointments, etc.  Non-urgent messages can be sent to your provider as well.   To learn more about what you can do with MyChart, go to [email or web address].    Your next appointment:   6 month(s)  Provider:   Orbie Pyo, MD     Other Instructions

## 2023-02-26 NOTE — Assessment & Plan Note (Signed)
Cardiac catheterization in 10/2022 with mod nonobstructive CAD in the LM and RCA. Med Rx was recommended. He is doing well w/o chest pain to suggest angina. He does have some L sided pain at times that sounds c/w MSK chest pain.  -Continue Atorvastatin 40 mg once daily -Continue Imdur 30 mg once daily, NTG prn -Follow up 6 mos

## 2023-02-28 DIAGNOSIS — H401132 Primary open-angle glaucoma, bilateral, moderate stage: Secondary | ICD-10-CM | POA: Diagnosis not present

## 2023-02-28 DIAGNOSIS — H2513 Age-related nuclear cataract, bilateral: Secondary | ICD-10-CM | POA: Diagnosis not present

## 2023-02-28 DIAGNOSIS — H35371 Puckering of macula, right eye: Secondary | ICD-10-CM | POA: Diagnosis not present

## 2023-03-10 ENCOUNTER — Ambulatory Visit
Admission: RE | Admit: 2023-03-10 | Discharge: 2023-03-10 | Disposition: A | Discharge status: Home / Self Care | Payer: Medicare PPO | Source: Ambulatory Visit | Attending: Family Medicine | Admitting: Family Medicine

## 2023-03-10 DIAGNOSIS — F17210 Nicotine dependence, cigarettes, uncomplicated: Secondary | ICD-10-CM | POA: Diagnosis not present

## 2023-03-10 DIAGNOSIS — Z122 Encounter for screening for malignant neoplasm of respiratory organs: Secondary | ICD-10-CM

## 2023-03-10 DIAGNOSIS — Z87891 Personal history of nicotine dependence: Secondary | ICD-10-CM

## 2023-04-07 ENCOUNTER — Other Ambulatory Visit: Payer: Self-pay

## 2023-04-07 ENCOUNTER — Other Ambulatory Visit: Payer: Self-pay | Admitting: Pulmonary Disease

## 2023-04-07 ENCOUNTER — Telehealth: Payer: Self-pay | Admitting: Acute Care

## 2023-04-07 DIAGNOSIS — Z87891 Personal history of nicotine dependence: Secondary | ICD-10-CM

## 2023-04-07 DIAGNOSIS — R911 Solitary pulmonary nodule: Secondary | ICD-10-CM

## 2023-04-07 DIAGNOSIS — F17210 Nicotine dependence, cigarettes, uncomplicated: Secondary | ICD-10-CM

## 2023-04-07 DIAGNOSIS — Z122 Encounter for screening for malignant neoplasm of respiratory organs: Secondary | ICD-10-CM

## 2023-04-07 NOTE — Telephone Encounter (Signed)
Patient returned call. Advised annual LDCT on 03/10/23 resulted as a LR2. There has been nodule growth from 8.61mm to 9.28mm since last years scan. After review Adam Robinsons NP would prefer to have patient due a 6 month follow up scan to assure nodule stability, due 09/09/2023. Patient is in agreement and has no questions. Order placed for 6 month follow up.

## 2023-04-07 NOTE — Telephone Encounter (Signed)
Called and left VM for pt

## 2023-04-07 NOTE — Telephone Encounter (Signed)
Patient had annual LDCT on 03/10/23 that resulted as a LR2. There has been nodule growth from 8.57mm to 9.64mm since last years scan. After review Kandice Robinsons NP would prefer to have patient due a 6 month follow up scan to assure nodule stability, due 09/09/2023.

## 2023-09-10 ENCOUNTER — Ambulatory Visit
Admission: RE | Admit: 2023-09-10 | Discharge: 2023-09-10 | Disposition: A | Discharge status: Home / Self Care | Source: Ambulatory Visit | Attending: Acute Care | Admitting: Acute Care

## 2023-09-10 DIAGNOSIS — R911 Solitary pulmonary nodule: Secondary | ICD-10-CM

## 2023-09-10 DIAGNOSIS — Z122 Encounter for screening for malignant neoplasm of respiratory organs: Secondary | ICD-10-CM

## 2023-09-10 DIAGNOSIS — F17210 Nicotine dependence, cigarettes, uncomplicated: Secondary | ICD-10-CM

## 2023-09-10 DIAGNOSIS — Z87891 Personal history of nicotine dependence: Secondary | ICD-10-CM

## 2023-09-22 ENCOUNTER — Telehealth: Payer: Self-pay

## 2023-09-22 NOTE — Telephone Encounter (Signed)
 Call Report from Tiffany  IMPRESSION: 1. Lung-RADS 4B, suspicious. Additional imaging evaluation or consultation with Pulmonology or Thoracic Surgery recommended. 2. 19.9 mm irregular spiculated nodular opacity in the left lower lobe confluent with and retracting the major fissure. Even in retrospect there was no lesion visible at this location on the study from 6 months ago. While the rapid interval appearance would be more in keeping with an infectious/inflammatory etiology, qualitative imaging features are highly suspicious for primary bronchogenic carcinoma. 3. Stable left adrenal adenoma. 4. Aortic Atherosclerosis (ICD10-I70.0) and Emphysema (ICD10-J43.9).

## 2023-09-24 ENCOUNTER — Telehealth: Payer: Self-pay | Admitting: Acute Care

## 2023-09-24 NOTE — Telephone Encounter (Signed)
 I have attempted to call the patient with the results of their  Low Dose CT Chest Lung cancer screening scan. There was no answer. I have left a HIPPA compliant VM requesting the patient call the office for the scan results. I included the office contact information in the message. We will await his return call. If no return call we will continue to call until patient is contacted.    Pt. Has a new 19.9 mm spiculated pulmonary nodule in the LLL.Please check with him to see if he has been sick. If he has been sick, we will do a 3 month follow up scan . He should see his PCP and get treated for this if he has been sick.   If he has not been sick, we will do a PET scan now and he will need to follow up with me 1 week after the scan has been done. Thanks so much.

## 2023-09-25 ENCOUNTER — Other Ambulatory Visit: Payer: Self-pay

## 2023-09-25 ENCOUNTER — Other Ambulatory Visit: Payer: Self-pay | Admitting: Internal Medicine

## 2023-09-25 DIAGNOSIS — Z87891 Personal history of nicotine dependence: Secondary | ICD-10-CM

## 2023-09-25 DIAGNOSIS — R911 Solitary pulmonary nodule: Secondary | ICD-10-CM

## 2023-09-25 DIAGNOSIS — Z122 Encounter for screening for malignant neoplasm of respiratory organs: Secondary | ICD-10-CM

## 2023-09-25 NOTE — Telephone Encounter (Signed)
 See provider note 09/24/2023

## 2023-10-03 ENCOUNTER — Other Ambulatory Visit: Payer: Self-pay | Admitting: Internal Medicine

## 2023-10-09 ENCOUNTER — Ambulatory Visit (HOSPITAL_COMMUNITY)
Admission: RE | Admit: 2023-10-09 | Discharge: 2023-10-09 | Disposition: A | Discharge status: Home / Self Care | Source: Ambulatory Visit | Attending: Acute Care | Admitting: Acute Care

## 2023-10-09 DIAGNOSIS — R911 Solitary pulmonary nodule: Secondary | ICD-10-CM | POA: Diagnosis present

## 2023-10-09 DIAGNOSIS — J439 Emphysema, unspecified: Secondary | ICD-10-CM | POA: Diagnosis not present

## 2023-10-09 DIAGNOSIS — D3502 Benign neoplasm of left adrenal gland: Secondary | ICD-10-CM | POA: Insufficient documentation

## 2023-10-09 DIAGNOSIS — R918 Other nonspecific abnormal finding of lung field: Secondary | ICD-10-CM | POA: Diagnosis not present

## 2023-10-09 DIAGNOSIS — Z87891 Personal history of nicotine dependence: Secondary | ICD-10-CM | POA: Diagnosis present

## 2023-10-09 DIAGNOSIS — Z122 Encounter for screening for malignant neoplasm of respiratory organs: Secondary | ICD-10-CM

## 2023-10-09 LAB — GLUCOSE, CAPILLARY: Glucose-Capillary: 103 mg/dL — ABNORMAL HIGH (ref 70–99)

## 2023-10-09 MED ORDER — FLUDEOXYGLUCOSE F - 18 (FDG) INJECTION
7.4900 | Freq: Once | INTRAVENOUS | Status: AC | PRN
  Administered 2023-10-09: 7.49 via INTRAVENOUS

## 2023-10-14 ENCOUNTER — Other Ambulatory Visit: Payer: Self-pay | Admitting: Pulmonary Disease

## 2023-10-17 ENCOUNTER — Encounter: Payer: Self-pay | Admitting: Acute Care

## 2023-10-17 ENCOUNTER — Telehealth: Payer: Self-pay

## 2023-10-17 ENCOUNTER — Ambulatory Visit: Admitting: Acute Care

## 2023-10-17 ENCOUNTER — Telehealth: Payer: Self-pay | Admitting: Acute Care

## 2023-10-17 VITALS — BP 123/68 | HR 67 | Temp 97.5°F | Ht 72.0 in | Wt 154.2 lb

## 2023-10-17 DIAGNOSIS — F17210 Nicotine dependence, cigarettes, uncomplicated: Secondary | ICD-10-CM | POA: Diagnosis not present

## 2023-10-17 DIAGNOSIS — J449 Chronic obstructive pulmonary disease, unspecified: Secondary | ICD-10-CM

## 2023-10-17 DIAGNOSIS — Z720 Tobacco use: Secondary | ICD-10-CM

## 2023-10-17 DIAGNOSIS — R942 Abnormal results of pulmonary function studies: Secondary | ICD-10-CM

## 2023-10-17 DIAGNOSIS — R911 Solitary pulmonary nodule: Secondary | ICD-10-CM | POA: Diagnosis not present

## 2023-10-17 MED ORDER — TRELEGY ELLIPTA 100-62.5-25 MCG/ACT IN AEPB: 1.0000 | INHALATION_SPRAY | Freq: Every day | RESPIRATORY_TRACT | 12 refills | Status: DC

## 2023-10-17 MED ORDER — TRELEGY ELLIPTA 200-62.5-25 MCG/ACT IN AEPB: 1.0000 | INHALATION_SPRAY | Freq: Every day | RESPIRATORY_TRACT | Status: DC

## 2023-10-17 NOTE — Telephone Encounter (Signed)
 Spoke nurse at [facility] to get okay for patient to be off xarelto  for procedure should be receiving a call later with the okay on that

## 2023-10-17 NOTE — Telephone Encounter (Signed)
 Please schedule the following:  Provider performing procedure: Byrum Diagnosis: Lung nodule Which side if for nodule / mass?  Left Procedure:  Navigational bronchoscopy with biopsies Has patient been spoken to by Provider and given informed consent?  Yes, to Lauraine NP Anesthesia: General Do you need Fluro?  Yes Duration of procedure:  1.5 hours Date: 10/28/2023 Alternate Date: 10/27/2023  Time:Would like the to be there at 11 for  a 1 ish bronch time Location:  [facility] Does patient have OSA? No DM? No Or Latex allergy?  No Medication Restriction/ Anticoagulate/Antiplatelet:  Xaralto needs to be held x 48 hours prior to procedure Pre-op Labs Ordered:determined by Anesthesia Imaging request:   Low Dose CT chest 09/22/2023 (If, SuperDimension CT Chest, please have STAT courier sent to ENDO)

## 2023-10-17 NOTE — Telephone Encounter (Signed)
 Patient scheduled 10/28/23 at [facility]. Routing to shakimah to [employer]

## 2023-10-17 NOTE — Patient Instructions (Addendum)
 It is good to see you today. We have reviewed your PET scan .  It did show the left lower lobe of concern was tracer avid on PET scan. Best option for plan of care is for biopsy. I have placed an order for a bronchoscopy with biopsies.  We have discussed the procedure in detail.  We have reviewed the risks and benefits of the procedure. These include bleeding, infection, puncture of the lung, and adverse reaction to anesthesia. You have agreed to proceed with biopsy to evaluate the left lower lobe nodule. Your procedure will be done by Dr. Lamar Chris. You will receive a letter today with date time and information pertaining to the procedure. You will need someone to drive you to the procedure, stay with you during the procedure, and stay with you after the procedure. You will also need someone to stay with you for 24 hours after anesthesia to ensure you have cleared and are doing well. You will follow-up with me 1 week after the procedure to review the results and to ensure you are doing well. Call if you need us  prior to the procedure or if you have any questions at all. Please contact office for sooner follow up if symptoms do not improve or worsen or seek emergency care   Please work on quitting smoking. You can receive free nicotine replacement therapy (patches, gum, or mints) by calling 1-800-QUIT NOW. Please call so we can get you on the path to becoming a non-smoker. I know it is hard, but you can do this!  Hypnosis for smoking cessation  [employer] [phone]  Acupuncture for smoking cessation  [employer] [phone]

## 2023-10-17 NOTE — H&P (View-Only) (Signed)
 History of Present Illness Adam Gilmore is a 73 y.o. male current every day smoker followed through the lung cancer screening program, referred for consult for abnormal lung cancer screening. He will be  followed by Dr. Shelah.   10/17/2023 Discussed the use of AI scribe software for clinical note transcription with the patient, who gave verbal consent to proceed.  History of Present Illness Pt. Presents for follow up after PET scan to further evaluate abnormal lung cancer screening scan.  Which revealed an abnormal left lower lobe pulmonary nodule.   Adam Gilmore is a 72 year old male who presents with a left lower lobe lung nodule for further evaluation.  A left lower lobe lung nodule has developed, with recent PET scan showing abnormal uptake. Three additional small nodules exhibit low-level uptake, with the most intense being a 5 mm nodule in the left upper lobe. He has experienced significant weight loss, approximately 30 pounds over the last few years, with 10 pounds lost in the past six months. No hemoptysis, recent illness, fever, or purulent secretions.  We discussed best options for next steps in regard to evaluating this pulmonary nodule.  I explained that the best way to definitively diagnose with this is is for a navigational bronchoscopy with biopsies.  The patient agrees and would like to move forward.  We discussed the risks to include bleeding, infection, pneumothorax, and adverse reaction to anesthesia.  He is on Xarelto  for anticoagulation due to previous blood clots in his legs. He takes Xarelto  once daily.  We will get clearance for this to be held for 48 hours prior to the procedure.  Respiratory symptoms improved significantly with Trelegy 200 mg, allowing him to climb stairs without stopping. The current Trelegy 100 mg dose is less effective, and he wishes to return to the 200 mg dose.      Test Results: PET scan 10/09/2023 Dominant spiculated nodule left  lower lobe has significant abnormal uptake of maximum SUV of 5.3 and is worrisome for neoplasm. Recommend further evaluation.   There 3 other small nodules elsewhere in the lungs which have developed over time and have low-level uptake. The most intense is in the left upper lung measuring maximum SUV of 2.0 and measures 5 mm in size. This would be suspicious for size based on this level of uptake. Other lesions could be followed.   Low-dose CT 09/10/2023 Lung-RADS 4B, suspicious. Additional imaging evaluation or consultation with Pulmonology or Thoracic Surgery recommended. 2. 19.9 mm irregular spiculated nodular opacity in the left lower lobe confluent with and retracting the major fissure. Even in retrospect there was no lesion visible at this location on the study from 6 months ago. While the rapid interval appearance would be more in keeping with an infectious/inflammatory etiology, qualitative imaging features are highly suspicious for primary bronchogenic carcinoma. 3. Stable left adrenal adenoma. 4. Aortic Atherosclerosis (ICD10-I70.0) and Emphysema (ICD10-J43.9).       Latest Ref Rng & Units 10/15/2022    3:48 PM 04/07/2021    5:05 AM 04/06/2021    4:42 AM  CBC  WBC 3.4 - 10.8 x10E3/uL 5.7  5.0  5.4   Hemoglobin 13.0 - 17.7 g/dL 84.6  85.3  85.8   Hematocrit 37.5 - 51.0 % 43.9  43.6  41.3   Platelets 150 - 450 x10E3/uL 214  209  176        Latest Ref Rng & Units 10/15/2022    3:48 PM 04/07/2021  5:05 AM 04/06/2021    4:42 AM  BMP  Glucose 70 - 99 mg/dL 82  874  876   BUN 8 - 27 mg/dL 10  15  14    Creatinine 0.76 - 1.27 mg/dL 9.05  9.16  9.16   BUN/Creat Ratio 10 - 24 11     Sodium 134 - 144 mmol/L 147  138  139   Potassium 3.5 - 5.2 mmol/L 5.3  3.6  3.9   Chloride 96 - 106 mmol/L 103  100  101   CO2 20 - 29 mmol/L 29  31  32   Calcium  8.6 - 10.2 mg/dL 9.5  8.4  8.3     BNP    Component Value Date/Time   BNP 31.3 04/02/2021 1626    ProBNP No results found  for: PROBNP  PFT    Component Value Date/Time   FEV1PRE 1.13 03/26/2022 1452   FEV1POST 1.24 03/26/2022 1452   FVCPRE 2.88 03/26/2022 1452   FVCPOST 3.06 03/26/2022 1452   TLC 11.07 03/26/2022 1452   DLCOUNC 9.21 03/26/2022 1452   PREFEV1FVCRT 39 03/26/2022 1452   PSTFEV1FVCRT 41 03/26/2022 1452    NM PET Image Initial (PI) Skull Base To Thigh Result Date: 10/10/2023 CLINICAL DATA:  Initial treatment strategy for lung nodule. EXAM: NUCLEAR MEDICINE PET SKULL BASE TO THIGH TECHNIQUE: 7.49 mCi F-18 FDG was injected intravenously. Full-ring PET imaging was performed from the skull base to thigh after the radiotracer. CT data was obtained and used for attenuation correction and anatomic localization. Fasting blood glucose: 103 mg/dl COMPARISON:  Noncontrast CT 09/10/2023 and older. Abdomen pelvis CT 2019 FINDINGS: Mediastinal blood pool activity: SUV max 2.3 Liver activity: SUV max 2.5 NECK: No specific abnormal uptake identified in the neck including along lymph node change of the submandibular, posterior triangle or internal jugular region. Near symmetric uptake of the visualized intracranial compartment. Incidental CT findings: The parotid glands, submandibular glands are grossly preserved. There is some enlargement of the left thyroid lobe without abnormal uptake. Streak artifact related to the patient's dental hardware. Visualized paranasal sinuses and mastoid air cells are clear. Scattered vascular calcifications are seen. CHEST: There are 2 hypermetabolic left-sided lung nodules. Larger focus is seen in the superior segment left lower lobe abutting the interlobar fissure with traction and spiculation. On the prior CT this measured 20 mm. This has maximum SUV value of 5.3 and view worrisome for neoplasm. There is also some mild uptake within small spiculated nodule left upper lobe of maximum SUV of 2.0. This nodule on image 65 of series 4 measures 5 mm. This is significant amount of uptake for  nodule of this size. In addition this nodule was 1-2 mm going back to a study of December 2024. Additional small asymmetric area of uptake with maximum SUV value of 0.9 corresponding to a nodular area in the right lung, posteroinferior right upper lobe measuring 3 mm on series 4, image 83. This also was not present in December 2024 but is simply too small to further delineate. Similar focus medially right upper lobe with nodular focus on image 71 of series 4 measuring 5 mm and minimal uptake of maximum SUV of 1.3. There is some other areas of nodularity in the lungs which do not show abnormal uptake. There is some mild uptake along a bandlike opacity along the posterior right lung apex, unchanged from previous examinations and could represent areas of scarring and fibrotic changes. This been present since at least 2022. No specific  abnormal uptake above blood pool in the axillary regions, hilum or mediastinum. Incidental CT findings: Advanced emphysematous lung changes. No pleural effusion, consolidation or pneumothorax. Coronary artery calcifications are seen. Heart is nonenlarged. No pericardial effusion. There is significant calcifications along the aortic valve. Moderate calcifications seen along the thoracic aorta. Layering debris in the trachea. ABDOMEN/PELVIS: There is some mild asymmetric uptake along the stomach diffusely. The stomach is underdistended and there is some question of fold thickening. Please correlate for any symptoms of gastritis or other process. Otherwise there is physiologic distribution radiotracer along the parenchymal organs, bowel and renal collecting systems. No specific abnormal nodal uptake. Incidental CT findings: Breathing motion. Few splenic calcifications. Grossly the liver, pancreas and right adrenal gland are preserved. There is low-attenuation nodularity of the left adrenal gland consistent with an adenoma measuring 37 mm. Hounsfield units of less than 0 and no specific  abnormal uptake. Gallbladder is present. No renal or ureteral stones. Large bowel has a normal course and caliber with scattered colonic stool. Small bowel stool appearance. Diffuse vascular calcifications. Minimal ectasia of the abdominal aorta but under 3 cm. Normal caliber IVC. SKELETON: No abnormal uptake along the visualized osseous structures. Incidental CT findings: Scattered degenerative changes. IMPRESSION: Dominant spiculated nodule left lower lobe has significant abnormal uptake of maximum SUV of 5.3 and is worrisome for neoplasm. Recommend further evaluation. There 3 other small nodules elsewhere in the lungs which have developed over time and have low-level uptake. The most intense is in the left upper lung measuring maximum SUV of 2.0 and measures 5 mm in size. This would be suspicious for size based on this level of uptake. Other lesions could be followed. Mild asymmetric uptake involving the stomach diffusely with fold thickening. Stomach is underdistended. Please correlate with any symptoms. No additional areas of abnormal radiotracer uptake. Advanced emphysematous changes. Right apical lung scarring, stable from previous. Diffuse colonic stool. Left adrenal adenoma Electronically Signed   By: Ranell Bring M.D.   On: 10/10/2023 14:36     Past medical hx Past Medical History:  Diagnosis Date   Adenomatous colon polyp    Centrilobular emphysema (HCC)    COPD (chronic obstructive pulmonary disease) (HCC)    History of DVT of lower extremity    Hypercholesterolemia    Hypercholesterolemia    prostate ca dx'd 03/2012   surg only   Skin cancer    Tobacco dependence      Social History   Tobacco Use   Smoking status: Every Day    Current packs/day: 1.00    Average packs/day: 1 pack/day for 56.0 years (56.0 ttl pk-yrs)    Types: Cigarettes   Smokeless tobacco: Never   Tobacco comments:    1 pack a day 10/17/2023 KRD  Vaping Use   Vaping status: Never Used  Substance Use Topics    Alcohol use: Yes    Comment: socially   Drug use: No    Mr.Kos reports that he has been smoking cigarettes. He has a 56 pack-year smoking history. He has never used smokeless tobacco. He reports current alcohol use. He reports that he does not use drugs.  Tobacco Cessation: Ready to quit: Not Answered Counseling given: Not Answered Tobacco comments: 1 pack a day 10/17/2023 KRD Current every day smoker , I spent 3-4 minutes counseling patient on  steps to stop use of tobacco products. I have provided patient with information on receiving free nicotine replacement therapy, and contact numbers for hypnosis for smoking cessation as  well as acupuncture for smoking cessation.   Past surgical hx, Family hx, Social hx all reviewed.  Current Outpatient Medications on File Prior to Visit  Medication Sig   acetaminophen  (TYLENOL ) 325 MG tablet Take 325 mg by mouth every 6 (six) hours as needed (for pain.).   albuterol  (VENTOLIN  HFA) 108 (90 Base) MCG/ACT inhaler TAKE 2 PUFFS BY MOUTH EVERY 6 HOURS AS NEEDED FOR WHEEZE OR SHORTNESS OF BREATH   atorvastatin  (LIPITOR) 40 MG tablet TAKE 1 TABLET BY MOUTH EVERY DAY   fluticasone  (FLONASE ) 50 MCG/ACT nasal spray Place 2 sprays into both nostrils daily.   Fluticasone -Umeclidin-Vilant (TRELEGY ELLIPTA ) 100-62.5-25 MCG/ACT AEPB Inhale 1 puff into the lungs daily.   isosorbide  mononitrate (IMDUR ) 30 MG 24 hr tablet TAKE 1 TABLET BY MOUTH AT BEDTIME.   latanoprost  (XALATAN ) 0.005 % ophthalmic solution Place 1 drop into both eyes at bedtime.   niacin  500 MG tablet Take 500 mg by mouth at bedtime.   nitroGLYCERIN  (NITROSTAT ) 0.4 MG SL tablet Place 1 tablet (0.4 mg total) under the tongue every 5 (five) minutes as needed for chest pain.   rivaroxaban  (XARELTO ) 20 MG TABS tablet Take 20 mg by mouth daily with supper.   vitamin C (ASCORBIC ACID ) 500 MG tablet Take 500 mg by mouth daily.   vitamin E  180 MG (400 UNITS) capsule Take 400 Units by mouth daily.   No  current facility-administered medications on file prior to visit.     Allergies  Allergen Reactions   Rosuvastatin     Other Reaction(s): sluggish    Review Of Systems:  Constitutional:   + weight loss, No night sweats,  Fevers, chills, fatigue, or  lassitude.  HEENT:   No headaches,  Difficulty swallowing,  Tooth/dental problems, or  Sore throat,                No sneezing, itching, ear ache, nasal congestion, post nasal drip,   CV:  No chest pain,  Orthopnea, PND, swelling in lower extremities, anasarca, dizziness, palpitations, syncope.   GI  No heartburn, indigestion, abdominal pain, nausea, vomiting, diarrhea, change in bowel habits, loss of appetite, bloody stools.   Resp: + shortness of breath with exertion less at rest.  + baseline excess mucus, + baseline  productive cough,  No non-productive cough,  No coughing up of blood.  No change in color of mucus.  No wheezing.  No chest wall deformity  Skin: no rash or lesions.  GU: no dysuria, change in color of urine, no urgency or frequency.  No flank pain, no hematuria   MS:  No joint pain or swelling.  No decreased range of motion.  No back pain.  Psych:  No change in mood or affect. No depression or anxiety.  No memory loss.   Vital Signs BP 123/68   Pulse 67   Temp (!) 97.5 F (36.4 C) (Oral)   Ht 6' (1.829 m)   Wt 154 lb 3.2 oz (69.9 kg)   SpO2 93%   BMI 20.91 kg/m    Physical Exam:  General- No distress,  A&Ox3, pleasant and appropriate ENT: No sinus tenderness, TM clear, pale nasal mucosa, no oral exudate,no post nasal drip, no LAN Cardiac: S1, S2, regular rate and rhythm, no murmur Chest: No wheeze/ rales/ dullness; no accessory muscle use, no nasal flaring, no sternal retractions Abd.: Soft Non-tender, nondistended, bowel sounds positive, Body mass index is 20.91 kg/m.   Ext: No clubbing cyanosis, edema, no obvious deformities Neuro:  normal strength, moving all extremities x 4, alert and oriented x 3,  appropriate Skin: No rashes, warm and dry, no obvious skin lesions Psych: normal mood and behavior   Assessment/Plan  Assessment and Plan Assessment & Plan Pulmonary nodules and a current everyday smoker with PET avidity, suspicious for neoplasm Plan We have reviewed your PET scan .  It did show the left lower lobe of concern was tracer avid on PET scan. Best option for plan of care is for biopsy. I have placed an order for a bronchoscopy with biopsies.  We have discussed the procedure in detail.  We have reviewed the risks and benefits of the procedure. These include bleeding, infection, puncture of the lung, and adverse reaction to anesthesia. You have agreed to proceed with biopsy to evaluate the left lower lobe nodule. Your procedure will be done by Dr. Lamar Chris. You will receive a letter today with date time and information pertaining to the procedure. You will need someone to drive you to the procedure, stay with you during the procedure, and stay with you after the procedure. You will also need someone to stay with you for 24 hours after anesthesia to ensure you have cleared and are doing well. You will follow-up with me 1 week after the procedure to review the results and to ensure you are doing well. Call if you need us  prior to the procedure or if you have any questions Please contact office for sooner follow up if symptoms do not improve or worsen or seek emergency care   Please work on quitting smoking. You can receive free nicotine replacement therapy (patches, gum, or mints) by calling 1-800-QUIT NOW. Please call so we can get you on the path to becoming a non-smoker. I know it is hard, but you can do this!  Hypnosis for smoking cessation  [employer] [phone]  Acupuncture for smoking cessation  [employer] [phone]       Chronic obstructive pulmonary disease (COPD) COPD managed with Trelegy. Improved symptoms with 200 mg dose,  indicating better control. - Prescribe Trelegy 200 mg to be filled at [facility]. - Provide samples of Trelegy 200 mg until the prescription is filled.  Chronic cough Chronic cough likely related to COPD and possibly pulmonary nodules. No pneumonia on recent imaging.  Unintentional weight loss Weight loss of 30 pounds over the last few years, with 10 pounds in the last six months. Concerning in context of potential malignancy with suspicious nodules.  History of deep vein thrombosis (DVT) on chronic anticoagulation DVT managed with Xarelto .  -Anticoagulation to be held for two days prior to bronchoscopy to minimize bleeding risk. - Hold Xarelto  for two days prior to the bronchoscopy, with the last dose on August 9th. - Coordinate with primary care to ensure holding Xarelto  is acceptable.   I spent 35 minutes dedicated to the care of this patient on the date of this encounter to include pre-visit review of records, face-to-face time with the patient discussing conditions above, post visit ordering of testing, clinical documentation with the electronic health record, making appropriate referrals as documented, and communicating necessary information to the patient's healthcare team.     Lauraine JULIANNA Lites, NP 10/17/2023  11:05 AM

## 2023-10-17 NOTE — Progress Notes (Signed)
 History of Present Illness Adam Gilmore is a 73 y.o. male current every day smoker followed through the lung cancer screening program, referred for consult for abnormal lung cancer screening. He will be  followed by Dr. Shelah.   10/17/2023 Discussed the use of AI scribe software for clinical note transcription with the patient, who gave verbal consent to proceed.  History of Present Illness Pt. Presents for follow up after PET scan to further evaluate abnormal lung cancer screening scan.  Which revealed an abnormal left lower lobe pulmonary nodule.   Adam Gilmore is a 73 year old male who presents with a left lower lobe lung nodule for further evaluation.  A left lower lobe lung nodule has developed, with recent PET scan showing abnormal uptake. Three additional small nodules exhibit low-level uptake, with the most intense being a 5 mm nodule in the left upper lobe. He has experienced significant weight loss, approximately 30 pounds over the last few years, with 10 pounds lost in the past six months. No hemoptysis, recent illness, fever, or purulent secretions.  We discussed best options for next steps in regard to evaluating this pulmonary nodule.  I explained that the best way to definitively diagnose with this is is for a navigational bronchoscopy with biopsies.  The patient agrees and would like to move forward.  We discussed the risks to include bleeding, infection, pneumothorax, and adverse reaction to anesthesia.  He is on Xarelto  for anticoagulation due to previous blood clots in his legs. He takes Xarelto  once daily.  We will get clearance for this to be held for 48 hours prior to the procedure.  Respiratory symptoms improved significantly with Trelegy 200 mg, allowing him to climb stairs without stopping. The current Trelegy 100 mg dose is less effective, and he wishes to return to the 200 mg dose.      Test Results: PET scan 10/09/2023 Dominant spiculated nodule left  lower lobe has significant abnormal uptake of maximum SUV of 5.3 and is worrisome for neoplasm. Recommend further evaluation.   There 3 other small nodules elsewhere in the lungs which have developed over time and have low-level uptake. The most intense is in the left upper lung measuring maximum SUV of 2.0 and measures 5 mm in size. This would be suspicious for size based on this level of uptake. Other lesions could be followed.   Low-dose CT 09/10/2023 Lung-RADS 4B, suspicious. Additional imaging evaluation or consultation with Pulmonology or Thoracic Surgery recommended. 2. 19.9 mm irregular spiculated nodular opacity in the left lower lobe confluent with and retracting the major fissure. Even in retrospect there was no lesion visible at this location on the study from 6 months ago. While the rapid interval appearance would be more in keeping with an infectious/inflammatory etiology, qualitative imaging features are highly suspicious for primary bronchogenic carcinoma. 3. Stable left adrenal adenoma. 4. Aortic Atherosclerosis (ICD10-I70.0) and Emphysema (ICD10-J43.9).       Latest Ref Rng & Units 10/15/2022    3:48 PM 04/07/2021    5:05 AM 04/06/2021    4:42 AM  CBC  WBC 3.4 - 10.8 x10E3/uL 5.7  5.0  5.4   Hemoglobin 13.0 - 17.7 g/dL 84.6  85.3  85.8   Hematocrit 37.5 - 51.0 % 43.9  43.6  41.3   Platelets 150 - 450 x10E3/uL 214  209  176        Latest Ref Rng & Units 10/15/2022    3:48 PM 04/07/2021  5:05 AM 04/06/2021    4:42 AM  BMP  Glucose 70 - 99 mg/dL 82  874  876   BUN 8 - 27 mg/dL 10  15  14    Creatinine 0.76 - 1.27 mg/dL 9.05  9.16  9.16   BUN/Creat Ratio 10 - 24 11     Sodium 134 - 144 mmol/L 147  138  139   Potassium 3.5 - 5.2 mmol/L 5.3  3.6  3.9   Chloride 96 - 106 mmol/L 103  100  101   CO2 20 - 29 mmol/L 29  31  32   Calcium  8.6 - 10.2 mg/dL 9.5  8.4  8.3     BNP    Component Value Date/Time   BNP 31.3 04/02/2021 1626    ProBNP No results found  for: PROBNP  PFT    Component Value Date/Time   FEV1PRE 1.13 03/26/2022 1452   FEV1POST 1.24 03/26/2022 1452   FVCPRE 2.88 03/26/2022 1452   FVCPOST 3.06 03/26/2022 1452   TLC 11.07 03/26/2022 1452   DLCOUNC 9.21 03/26/2022 1452   PREFEV1FVCRT 39 03/26/2022 1452   PSTFEV1FVCRT 41 03/26/2022 1452    NM PET Image Initial (PI) Skull Base To Thigh Result Date: 10/10/2023 CLINICAL DATA:  Initial treatment strategy for lung nodule. EXAM: NUCLEAR MEDICINE PET SKULL BASE TO THIGH TECHNIQUE: 7.49 mCi F-18 FDG was injected intravenously. Full-ring PET imaging was performed from the skull base to thigh after the radiotracer. CT data was obtained and used for attenuation correction and anatomic localization. Fasting blood glucose: 103 mg/dl COMPARISON:  Noncontrast CT 09/10/2023 and older. Abdomen pelvis CT 2019 FINDINGS: Mediastinal blood pool activity: SUV max 2.3 Liver activity: SUV max 2.5 NECK: No specific abnormal uptake identified in the neck including along lymph node change of the submandibular, posterior triangle or internal jugular region. Near symmetric uptake of the visualized intracranial compartment. Incidental CT findings: The parotid glands, submandibular glands are grossly preserved. There is some enlargement of the left thyroid lobe without abnormal uptake. Streak artifact related to the patient's dental hardware. Visualized paranasal sinuses and mastoid air cells are clear. Scattered vascular calcifications are seen. CHEST: There are 2 hypermetabolic left-sided lung nodules. Larger focus is seen in the superior segment left lower lobe abutting the interlobar fissure with traction and spiculation. On the prior CT this measured 20 mm. This has maximum SUV value of 5.3 and view worrisome for neoplasm. There is also some mild uptake within small spiculated nodule left upper lobe of maximum SUV of 2.0. This nodule on image 65 of series 4 measures 5 mm. This is significant amount of uptake for  nodule of this size. In addition this nodule was 1-2 mm going back to a study of December 2024. Additional small asymmetric area of uptake with maximum SUV value of 0.9 corresponding to a nodular area in the right lung, posteroinferior right upper lobe measuring 3 mm on series 4, image 83. This also was not present in December 2024 but is simply too small to further delineate. Similar focus medially right upper lobe with nodular focus on image 71 of series 4 measuring 5 mm and minimal uptake of maximum SUV of 1.3. There is some other areas of nodularity in the lungs which do not show abnormal uptake. There is some mild uptake along a bandlike opacity along the posterior right lung apex, unchanged from previous examinations and could represent areas of scarring and fibrotic changes. This been present since at least 2022. No specific  abnormal uptake above blood pool in the axillary regions, hilum or mediastinum. Incidental CT findings: Advanced emphysematous lung changes. No pleural effusion, consolidation or pneumothorax. Coronary artery calcifications are seen. Heart is nonenlarged. No pericardial effusion. There is significant calcifications along the aortic valve. Moderate calcifications seen along the thoracic aorta. Layering debris in the trachea. ABDOMEN/PELVIS: There is some mild asymmetric uptake along the stomach diffusely. The stomach is underdistended and there is some question of fold thickening. Please correlate for any symptoms of gastritis or other process. Otherwise there is physiologic distribution radiotracer along the parenchymal organs, bowel and renal collecting systems. No specific abnormal nodal uptake. Incidental CT findings: Breathing motion. Few splenic calcifications. Grossly the liver, pancreas and right adrenal gland are preserved. There is low-attenuation nodularity of the left adrenal gland consistent with an adenoma measuring 37 mm. Hounsfield units of less than 0 and no specific  abnormal uptake. Gallbladder is present. No renal or ureteral stones. Large bowel has a normal course and caliber with scattered colonic stool. Small bowel stool appearance. Diffuse vascular calcifications. Minimal ectasia of the abdominal aorta but under 3 cm. Normal caliber IVC. SKELETON: No abnormal uptake along the visualized osseous structures. Incidental CT findings: Scattered degenerative changes. IMPRESSION: Dominant spiculated nodule left lower lobe has significant abnormal uptake of maximum SUV of 5.3 and is worrisome for neoplasm. Recommend further evaluation. There 3 other small nodules elsewhere in the lungs which have developed over time and have low-level uptake. The most intense is in the left upper lung measuring maximum SUV of 2.0 and measures 5 mm in size. This would be suspicious for size based on this level of uptake. Other lesions could be followed. Mild asymmetric uptake involving the stomach diffusely with fold thickening. Stomach is underdistended. Please correlate with any symptoms. No additional areas of abnormal radiotracer uptake. Advanced emphysematous changes. Right apical lung scarring, stable from previous. Diffuse colonic stool. Left adrenal adenoma Electronically Signed   By: Ranell Bring M.D.   On: 10/10/2023 14:36     Past medical hx Past Medical History:  Diagnosis Date   Adenomatous colon polyp    Centrilobular emphysema (HCC)    COPD (chronic obstructive pulmonary disease) (HCC)    History of DVT of lower extremity    Hypercholesterolemia    Hypercholesterolemia    prostate ca dx'd 03/2012   surg only   Skin cancer    Tobacco dependence      Social History   Tobacco Use   Smoking status: Every Day    Current packs/day: 1.00    Average packs/day: 1 pack/day for 56.0 years (56.0 ttl pk-yrs)    Types: Cigarettes   Smokeless tobacco: Never   Tobacco comments:    1 pack a day 10/17/2023 KRD  Vaping Use   Vaping status: Never Used  Substance Use Topics    Alcohol use: Yes    Comment: socially   Drug use: No    Mr.Coulthard reports that he has been smoking cigarettes. He has a 56 pack-year smoking history. He has never used smokeless tobacco. He reports current alcohol use. He reports that he does not use drugs.  Tobacco Cessation: Ready to quit: Not Answered Counseling given: Not Answered Tobacco comments: 1 pack a day 10/17/2023 KRD Current every day smoker , I spent 3-4 minutes counseling patient on  steps to stop use of tobacco products. I have provided patient with information on receiving free nicotine replacement therapy, and contact numbers for hypnosis for smoking cessation as  well as acupuncture for smoking cessation.   Past surgical hx, Family hx, Social hx all reviewed.  Current Outpatient Medications on File Prior to Visit  Medication Sig   acetaminophen  (TYLENOL ) 325 MG tablet Take 325 mg by mouth every 6 (six) hours as needed (for pain.).   albuterol  (VENTOLIN  HFA) 108 (90 Base) MCG/ACT inhaler TAKE 2 PUFFS BY MOUTH EVERY 6 HOURS AS NEEDED FOR WHEEZE OR SHORTNESS OF BREATH   atorvastatin  (LIPITOR) 40 MG tablet TAKE 1 TABLET BY MOUTH EVERY DAY   fluticasone  (FLONASE ) 50 MCG/ACT nasal spray Place 2 sprays into both nostrils daily.   Fluticasone -Umeclidin-Vilant (TRELEGY ELLIPTA ) 100-62.5-25 MCG/ACT AEPB Inhale 1 puff into the lungs daily.   isosorbide  mononitrate (IMDUR ) 30 MG 24 hr tablet TAKE 1 TABLET BY MOUTH AT BEDTIME.   latanoprost  (XALATAN ) 0.005 % ophthalmic solution Place 1 drop into both eyes at bedtime.   niacin  500 MG tablet Take 500 mg by mouth at bedtime.   nitroGLYCERIN  (NITROSTAT ) 0.4 MG SL tablet Place 1 tablet (0.4 mg total) under the tongue every 5 (five) minutes as needed for chest pain.   rivaroxaban  (XARELTO ) 20 MG TABS tablet Take 20 mg by mouth daily with supper.   vitamin C (ASCORBIC ACID ) 500 MG tablet Take 500 mg by mouth daily.   vitamin E  180 MG (400 UNITS) capsule Take 400 Units by mouth daily.   No  current facility-administered medications on file prior to visit.     Allergies  Allergen Reactions   Rosuvastatin     Other Reaction(s): sluggish    Review Of Systems:  Constitutional:   + weight loss, No night sweats,  Fevers, chills, fatigue, or  lassitude.  HEENT:   No headaches,  Difficulty swallowing,  Tooth/dental problems, or  Sore throat,                No sneezing, itching, ear ache, nasal congestion, post nasal drip,   CV:  No chest pain,  Orthopnea, PND, swelling in lower extremities, anasarca, dizziness, palpitations, syncope.   GI  No heartburn, indigestion, abdominal pain, nausea, vomiting, diarrhea, change in bowel habits, loss of appetite, bloody stools.   Resp: + shortness of breath with exertion less at rest.  + baseline excess mucus, + baseline  productive cough,  No non-productive cough,  No coughing up of blood.  No change in color of mucus.  No wheezing.  No chest wall deformity  Skin: no rash or lesions.  GU: no dysuria, change in color of urine, no urgency or frequency.  No flank pain, no hematuria   MS:  No joint pain or swelling.  No decreased range of motion.  No back pain.  Psych:  No change in mood or affect. No depression or anxiety.  No memory loss.   Vital Signs BP 123/68   Pulse 67   Temp (!) 97.5 F (36.4 C) (Oral)   Ht 6' (1.829 m)   Wt 154 lb 3.2 oz (69.9 kg)   SpO2 93%   BMI 20.91 kg/m    Physical Exam:  General- No distress,  A&Ox3, pleasant and appropriate ENT: No sinus tenderness, TM clear, pale nasal mucosa, no oral exudate,no post nasal drip, no LAN Cardiac: S1, S2, regular rate and rhythm, no murmur Chest: No wheeze/ rales/ dullness; no accessory muscle use, no nasal flaring, no sternal retractions Abd.: Soft Non-tender, nondistended, bowel sounds positive, Body mass index is 20.91 kg/m.   Ext: No clubbing cyanosis, edema, no obvious deformities Neuro:  normal strength, moving all extremities x 4, alert and oriented x 3,  appropriate Skin: No rashes, warm and dry, no obvious skin lesions Psych: normal mood and behavior   Assessment/Plan  Assessment and Plan Assessment & Plan Pulmonary nodules and a current everyday smoker with PET avidity, suspicious for neoplasm Plan We have reviewed your PET scan .  It did show the left lower lobe of concern was tracer avid on PET scan. Best option for plan of care is for biopsy. I have placed an order for a bronchoscopy with biopsies.  We have discussed the procedure in detail.  We have reviewed the risks and benefits of the procedure. These include bleeding, infection, puncture of the lung, and adverse reaction to anesthesia. You have agreed to proceed with biopsy to evaluate the left lower lobe nodule. Your procedure will be done by Dr. Lamar Chris. You will receive a letter today with date time and information pertaining to the procedure. You will need someone to drive you to the procedure, stay with you during the procedure, and stay with you after the procedure. You will also need someone to stay with you for 24 hours after anesthesia to ensure you have cleared and are doing well. You will follow-up with me 1 week after the procedure to review the results and to ensure you are doing well. Call if you need us  prior to the procedure or if you have any questions Please contact office for sooner follow up if symptoms do not improve or worsen or seek emergency care   Please work on quitting smoking. You can receive free nicotine replacement therapy (patches, gum, or mints) by calling 1-800-QUIT NOW. Please call so we can get you on the path to becoming a non-smoker. I know it is hard, but you can do this!  Hypnosis for smoking cessation  [employer] [phone]  Acupuncture for smoking cessation  [employer] [phone]       Chronic obstructive pulmonary disease (COPD) COPD managed with Trelegy. Improved symptoms with 200 mg dose,  indicating better control. - Prescribe Trelegy 200 mg to be filled at [facility]. - Provide samples of Trelegy 200 mg until the prescription is filled.  Chronic cough Chronic cough likely related to COPD and possibly pulmonary nodules. No pneumonia on recent imaging.  Unintentional weight loss Weight loss of 30 pounds over the last few years, with 10 pounds in the last six months. Concerning in context of potential malignancy with suspicious nodules.  History of deep vein thrombosis (DVT) on chronic anticoagulation DVT managed with Xarelto .  -Anticoagulation to be held for two days prior to bronchoscopy to minimize bleeding risk. - Hold Xarelto  for two days prior to the bronchoscopy, with the last dose on August 9th. - Coordinate with primary care to ensure holding Xarelto  is acceptable.   I spent 35 minutes dedicated to the care of this patient on the date of this encounter to include pre-visit review of records, face-to-face time with the patient discussing conditions above, post visit ordering of testing, clinical documentation with the electronic health record, making appropriate referrals as documented, and communicating necessary information to the patient's healthcare team.     Lauraine JULIANNA Lites, NP 10/17/2023  11:05 AM

## 2023-10-20 ENCOUNTER — Encounter: Payer: Self-pay | Admitting: Acute Care

## 2023-10-22 ENCOUNTER — Telehealth: Payer: Self-pay | Admitting: *Deleted

## 2023-10-22 NOTE — Telephone Encounter (Addendum)
 He is on Xarelto  for anticoagulation due to previous blood clots in his legs. He takes Xarelto  once daily.  We will get clearance for this to be held for 48 hours prior to the procedure.   Spoke with Arna at Dr. Auston office, who states Dr. Auston approves a 2-day hold on Xarelto .

## 2023-10-23 ENCOUNTER — Telehealth: Payer: Self-pay | Admitting: *Deleted

## 2023-10-23 DIAGNOSIS — J449 Chronic obstructive pulmonary disease, unspecified: Secondary | ICD-10-CM

## 2023-10-23 MED ORDER — TRELEGY ELLIPTA 200-62.5-25 MCG/ACT IN AEPB: 1.0000 | INHALATION_SPRAY | Freq: Every day | RESPIRATORY_TRACT | 5 refills | Status: DC

## 2023-10-23 NOTE — Telephone Encounter (Signed)
 Thank you :)

## 2023-10-23 NOTE — Telephone Encounter (Signed)
 Patient notified new rx Trelegy was sent to [facility] pharmacy today. NFN

## 2023-10-23 NOTE — Addendum Note (Signed)
 Addended by: DAYNE SHERRY RAMAN on: 10/23/2023 08:16 AM   Modules accepted: Orders

## 2023-10-24 ENCOUNTER — Encounter (HOSPITAL_COMMUNITY): Payer: Self-pay | Admitting: Emergency Medicine

## 2023-10-27 ENCOUNTER — Other Ambulatory Visit: Payer: Self-pay

## 2023-10-27 ENCOUNTER — Encounter (HOSPITAL_COMMUNITY): Payer: Self-pay | Admitting: Emergency Medicine

## 2023-10-27 NOTE — Progress Notes (Addendum)
 PCP - Auston Opal, DO  Cardiologist - Thukkani, Arun K, MD   PPM/ICD - denies Device Orders - n/a Rep Notified - n/a  Chest x-ray - 04-02-21 EKG - DOS Stress Test - 10-10-22 ECHO - 10-23-22 Cardiac Cath - 10-17-22  CPAP - denies DM -denies  Blood Thinner Instructions: rivaroxaban  (XARELTO ) Last dose 10-25-23 Aspirin  Instructions: n/a  ERAS Protcol - NPO  COVID TEST- n/a  Anesthesia review: yes, HTN, A fib, PAD DVT  Patient verbally denies any shortness of breath, fever, cough and chest pain during phone call   -------------  SDW INSTRUCTIONS given:  Your procedure is scheduled on October 28, 2023.  Report to [facility] Main Entrance A at 10:15 A.M., and check in at the Admitting office.  Call this number if you have problems the morning of surgery:  [phone]   Remember:  Do not eat or drink after midnight the night before your surgery      Take these medicines the morning of surgery with A SIP OF WATER   acetaminophen  (TYLENOL )  albuterol  (VENTOLIN  HFA)  fluticasone  (FLONASE )  Fluticasone -Umeclidin-Vilant (TRELEGY ELLIPTA   nitroGLYCERIN  (NITROSTAT ) PLEASE CALL [phone] IF USED PRIOR TO PROCEDURE   rivaroxaban  (XARELTO ) HOLD two days prior LAST DSEO  As of today, STOP taking any Aspirin  (unless otherwise instructed by your surgeon) Aleve, Naproxen, Ibuprofen, Motrin, Advil, Goody's, BC's, all herbal medications, fish oil, and all vitamins.                      Do not wear jewelry, make up, or nail polish            Do not wear lotions, powders, perfumes/colognes, or deodorant.            Do not shave 48 hours prior to surgery.  Men may shave face and neck.            Do not bring valuables to the hospital.            [facility] is not responsible for any belongings or valuables.  Do NOT Smoke (Tobacco/Vaping) 24 hours prior to your procedure If you use a CPAP at night, you may bring all equipment for your overnight stay.   Contacts, glasses, dentures  or bridgework may not be worn into surgery.      For patients admitted to the hospital, discharge time will be determined by your treatment team.   Patients discharged the day of surgery will not be allowed to drive home, and someone needs to stay with them for 24 hours.    Special instructions:   [facility]- Preparing For Surgery  Before surgery, you can play an important role. Because skin is not sterile, your skin needs to be as free of germs as possible. You can reduce the number of germs on your skin by washing with CHG (chlorahexidine gluconate) Soap before surgery.  CHG is an antiseptic cleaner which kills germs and bonds with the skin to continue killing germs even after washing.    Oral Hygiene is also important to reduce your risk of infection.  Remember - BRUSH YOUR TEETH THE MORNING OF SURGERY WITH YOUR REGULAR TOOTHPASTE  Please do not use if you have an allergy to CHG or antibacterial soaps. If your skin becomes reddened/irritated stop using the CHG.  Do not shave (including legs and underarms) for at least 48 hours prior to first CHG shower. It is OK to shave your face.  Please follow these instructions  carefully.   Shower the NIGHT BEFORE SURGERY and the MORNING OF SURGERY with DIAL Soap.   Pat yourself dry with a CLEAN TOWEL.  Wear CLEAN PAJAMAS to bed the night before surgery  Place CLEAN SHEETS on your bed the night of your first shower and DO NOT SLEEP WITH PETS.   Day of Surgery: Please shower morning of surgery  Wear Clean/Comfortable clothing the morning of surgery Do not apply any deodorants/lotions.   Remember to brush your teeth WITH YOUR REGULAR TOOTHPASTE.   Questions were answered. Patient verbalized understanding of instructions.

## 2023-10-27 NOTE — Progress Notes (Signed)
 Anesthesia Chart Review: SAME DAY WORK-UP  Case: [id number] Date/Time: 10/28/23 1245   Procedure: VIDEO BRONCHOSCOPY WITH ENDOBRONCHIAL NAVIGATION (Left)   Anesthesia type: General   Diagnosis: Lung nodule seen on imaging study [R91.1]   Pre-op diagnosis: lung nodule   [facility] / [facility]   Surgeons: Shelah Lamar RAMAN, MD       DISCUSSION: Patient is a 73 year old male scheduled for the above procedure.  He had a suspicious LCS Chest CT on 09/10/23. PET scan on 10/09/23 showed LLL hypermetabolic spiculated lung nodule worrisome for neoplasm.   History includes smoking, COPD/emphysema, CAD (50% mRCA & 60% mLM, medical therapy recommended based on IVUS results 10/17/22), atrial fibrillation, hypercholesterolemia, skin cancer, DVT (LLE 2014, 2018 likely acute on chronic), prostate cancer (s/p robot-assisted laparoscopic radical prostatectomy 07/09/2012), ventral hernia (s/p repair 05/22/16).  Last cardiology visit was on 02/26/23 with Lelon Hamilton, PA-C. On Imdur , atorvastatin  for CAD, moderate non-obstructive in LM and RCA by 10/2022 LHC. He was doing well without symptoms to suggest angina.   He reported last Xarelto  dose as 10/25/23.   Anesthesia team to evaluated on the day of surgery.    VS:  Wt Readings from Last 3 Encounters:  10/17/23 69.9 kg  02/26/23 74.1 kg  11/19/22 73.6 kg   BP Readings from Last 3 Encounters:  10/17/23 123/68  02/26/23 120/60  11/19/22 120/64   Pulse Readings from Last 3 Encounters:  10/17/23 67  02/26/23 72  11/19/22 69     PROVIDERS: Auston Opal, DO is PCP  Neda Hammond, MD is pulmonologist    LABS: Most recent lab results in [facility] include: Lab Results  Component Value Date   WBC 5.7 10/15/2022   HGB 15.3 10/15/2022   HCT 43.9 10/15/2022   PLT 214 10/15/2022   GLUCOSE 82 10/15/2022   CHOL 113 11/29/2022   TRIG 62 11/29/2022   HDL 47 11/29/2022   LDLCALC 52 11/29/2022   ALT 13 11/29/2022   AST 12 11/29/2022    NA 147 (H) 10/15/2022   K 5.3 (H) 10/15/2022   CL 103 10/15/2022   CREATININE 0.94 10/15/2022   BUN 10 10/15/2022   CO2 29 10/15/2022   INR 1.01 05/20/2016    PFTs 03/26/22: FVC 2.88 (61%), post 3.06 (65%). FEV1 1.13 (32%), post 1.24 (36%). DLCO unc/cor 9.21 (33%).  - Conclusions: Reduced diffusing capacity, severe airway obstruction, normal airway resistance and relatively normal inspiratory flows or characteristic of emphysema.  In view of the severity of the diffusion defect, studies with exercise would be helpful to evaluate the presence of hypoxemia. Diagnosis: Severe obstructive airways disease, emphysematous type.   IMAGES: PET Scan 10/09/23: IMPRESSION: - Dominant spiculated nodule left lower lobe has significant abnormal uptake of maximum SUV of 5.3 and is worrisome for neoplasm. Recommend further evaluation. - There 3 other small nodules elsewhere in the lungs which have developed over time and have low-level uptake. The most intense is in the left upper lung measuring maximum SUV of 2.0 and measures 5 mm in size. This would be suspicious for size based on this level of uptake. Other lesions could be followed. - Mild asymmetric uptake involving the stomach diffusely with fold thickening. Stomach is underdistended. Please correlate with any symptoms. - No additional areas of abnormal radiotracer uptake. - Advanced emphysematous changes. Right apical lung scarring, stable from previous. Diffuse colonic stool. Left adrenal adenoma.  CT Chest LCS 09/10/23: IMPRESSION: 1. Lung-RADS 4B, suspicious. Additional imaging evaluation or consultation  with Pulmonology or Thoracic Surgery recommended. 2. 19.9 mm irregular spiculated nodular opacity in the left lower lobe confluent with and retracting the major fissure. Even in retrospect there was no lesion visible at this location on the study from 6 months ago. While the rapid interval appearance would be more in keeping with an  infectious/inflammatory etiology, qualitative imaging features are highly suspicious for primary bronchogenic carcinoma. 3. Stable left adrenal adenoma. 4. Aortic Atherosclerosis (ICD10-I70.0) and Emphysema (ICD10-J43.9).     EKG: 10/17/22:  Sinus rhythm with marked sinus arrhythmia with 1st degree A-V block Incomplete right bundle branch block Nonspecific ST and T wave abnormality Abnormal ECG When compared with ECG of 26-Sep-2022 08:00, No significant change was found and the previous tracing read as atrial fibrillation is an error Confirmed by Fernande Standing [phone]) on 10/19/2022 4:21:59 PM    CV: Echo 10/23/22: IMPRESSIONS   1. Left ventricular ejection fraction, by estimation, is 55 to 60%. The  left ventricle has normal function. The left ventricle has no regional  wall motion abnormalities. There is mild concentric left ventricular  hypertrophy. Left ventricular diastolic  parameters are consistent with Grade I diastolic dysfunction (impaired  relaxation).   2. Right ventricular systolic function is normal. The right ventricular  size is normal. Tricuspid regurgitation signal is inadequate for assessing  PA pressure.   3. The mitral valve is grossly normal. No evidence of mitral valve  regurgitation. No evidence of mitral stenosis.   4. The aortic valve has an indeterminant number of cusps. There is  moderate calcification of the aortic valve. There is moderate thickening  of the aortic valve. Aortic valve regurgitation is not visualized. Aortic  valve sclerosis/calcification is  present, without any evidence of aortic stenosis.   5. The inferior vena cava is normal in size with greater than 50%  respiratory variability, suggesting right atrial pressure of 3 mmHg.    Cardiac cath 10/17/22:   Mid RCA lesion is 50% stenosed.   Mid LM lesion is 60% stenosed.   1.  Angiographically significant mid left main disease however IVUS interrogation showed minimal luminal area of  greater than 6 mm and RFR (with the guiding catheter disengaged) was 1.0. 2.  Diffusely diseased right coronary artery with FFR angiography assessment of 0.72 at its midportion however the FFR decreases throughout the vessel indicating diffuse disease. 3.  LVEDP of 2 mmHg.   Recommendation: Medical therapy.  It is unlikely that the diffusely diseased right coronary artery is causing dyspnea or chest pain.  If PCI of the right coronary artery were pursued, very long segment of stents would be required.    US  Carotid 10/09/22: Summary:  - Right Carotid: Moderate: plaque visualized with moderate increase in  velocity in the right internal carotid artery.  - Left Carotid: Mild: plaque visualized in the left internal carotid artery.      Long term monitor 09/26/22 - 09/29/22: Patient had a min HR of 52 bpm, max HR of 109 bpm, and avg HR of 77 bpm. Predominant underlying rhythm was Sinus Rhythm.    EVENTS: -1 run of Supraventricular Tachycardia occurred lasting 6 beats with a max rate of 109 bpm (avg 99 bpm).  -Isolated SVEs were occasional (2.4%, 8320), SVE Couplets were occasional (3.3%, 5755), and SVE Triplets were occasional (1.0%, 1200).  -Isolated VEs were occasional (3.2%, 11046), VE Couplets were rare (<1.0%, 26), and no VE Triplets were present. Ventricular Bigeminy and Trigeminy were present.   No atrial fibrillation, sustained ventricular  tachyarrhythmias, or bradyarrhythmias were detected. No patient triggered events.   US  Abd Aorta 06/14/21: Summary:  Abdominal Aorta: No evidence of an abdominal aortic aneurysm was  visualized. The largest aortic measurement is 2.7 cm. Abnormal tapering of  the abdominal aorta is evident. The proximal aorta is ectatic, with  largest measurements at 2.7 cm.  - Widely patent left common and external iliac arteries without evidence of  stenosis.  - IVC/Iliac: Patent IVC.      Past Medical History:  Diagnosis Date   Adenomatous colon polyp     Atrial fibrillation (HCC)    Centrilobular emphysema (HCC)    COPD (chronic obstructive pulmonary disease) (HCC)    Coronary artery disease    History of DVT of lower extremity    Hypercholesterolemia    Hypercholesterolemia    prostate ca dx'd 03/2012   surg only   Skin cancer    Tobacco dependence     Past Surgical History:  Procedure Laterality Date   APPENDECTOMY     June 14, 2015   COLONOSCOPY  1/14   CORONARY PRESSURE/FFR STUDY N/A 10/17/2022   Procedure: CORONARY PRESSURE/FFR STUDY;  Surgeon: Wendel Lurena POUR, MD;  [facility];  Service: Cardiovascular;  Laterality: N/A;   CORONARY PRESSURE/FFR WITH 3D MAPPING N/A 10/17/2022   Procedure: Coronary Pressure/FFR w/3D Mapping;  Surgeon: Wendel Lurena POUR, MD;  [facility];  Service: Cardiovascular;  Laterality: N/A;   CORONARY ULTRASOUND/IVUS N/A 10/17/2022   Procedure: Coronary Ultrasound/IVUS;  Surgeon: Wendel Lurena POUR, MD;  [facility];  Service: Cardiovascular;  Laterality: N/A;   INSERTION OF MESH N/A 05/22/2016   Procedure: INSERTION OF MESH;  Surgeon: Mitzie DELENA Freund, MD;  Location: [facility];  Service: General;  Laterality: N/A;   LEFT HEART CATH AND CORONARY ANGIOGRAPHY N/A 10/17/2022   Procedure: LEFT HEART CATH AND CORONARY ANGIOGRAPHY;  Surgeon: Wendel Lurena POUR, MD;  [facility];  Service: Cardiovascular;  Laterality: N/A;   LYMPHADENECTOMY Bilateral 07/09/2012   Procedure: LYMPHADENECTOMY;  Surgeon: Noretta Ferrara, MD;  Location: [facility] ORS;  Service: Urology;  Laterality: Bilateral;   ROBOT ASSISTED LAPAROSCOPIC RADICAL PROSTATECTOMY N/A 07/09/2012   Procedure: ROBOTIC ASSISTED LAPAROSCOPIC RADICAL PROSTATECTOMY LEVEL 2;  Surgeon: Noretta Ferrara, MD;  Location: [facility] ORS;  Service: Urology;  Laterality: N/A;   TONSILLECTOMY     VENTRAL HERNIA REPAIR N/A 05/22/2016   Procedure: LAPAROSCOPIC VENTRAL HERNIA REPAIR WITH MESH;  Surgeon: Mitzie DELENA Freund, MD;  Location: [facility];   Service: General;  Laterality: N/A;    MEDICATIONS: No current facility-administered medications for this encounter.    acetaminophen  (TYLENOL ) 325 MG tablet   albuterol  (VENTOLIN  HFA) 108 (90 Base) MCG/ACT inhaler   atorvastatin  (LIPITOR) 40 MG tablet   fluticasone  (FLONASE ) 50 MCG/ACT nasal spray   Fluticasone -Umeclidin-Vilant (TRELEGY ELLIPTA ) 200-62.5-25 MCG/ACT AEPB   Fluticasone -Umeclidin-Vilant (TRELEGY ELLIPTA ) 200-62.5-25 MCG/ACT AEPB   isosorbide  mononitrate (IMDUR ) 30 MG 24 hr tablet   latanoprost  (XALATAN ) 0.005 % ophthalmic solution   niacin  500 MG tablet   nitroGLYCERIN  (NITROSTAT ) 0.4 MG SL tablet   rivaroxaban  (XARELTO ) 20 MG TABS tablet   vitamin C (ASCORBIC ACID ) 500 MG tablet   vitamin E  180 MG (400 UNITS) capsule    Obera Stauch, PA-C Surgical Short Stay/Anesthesiology [facility] Phone [phone] [facility] Phone [phone] 10/27/2023 3:17 PM

## 2023-10-27 NOTE — Anesthesia Preprocedure Evaluation (Addendum)
 Anesthesia Evaluation  Patient identified by MRN, date of birth, ID band Patient awake    Reviewed: Allergy & Precautions, NPO status , Patient's Chart, lab work & pertinent test results  History of Anesthesia Complications Negative for: history of anesthetic complications  Airway Mallampati: II  TM Distance: >3 FB Neck ROM: Full    Dental no notable dental hx. (+) Teeth Intact   Pulmonary neg sleep apnea, COPD,  COPD inhaler, Current Smoker and Patient abstained from smoking.   Pulmonary exam normal breath sounds clear to auscultation       Cardiovascular Exercise Tolerance: Good METS(-) hypertension+ CAD and + Peripheral Vascular Disease  (-) Past MI + dysrhythmias Atrial Fibrillation  Rhythm:Regular Rate:Normal - Systolic murmurs Echo 10/23/22: IMPRESSIONS   1. Left ventricular ejection fraction, by estimation, is 55 to 60%. The  left ventricle has normal function. The left ventricle has no regional  wall motion abnormalities. There is mild concentric left ventricular  hypertrophy. Left ventricular diastolic  parameters are consistent with Grade I diastolic dysfunction (impaired  relaxation).   2. Right ventricular systolic function is normal. The right ventricular  size is normal. Tricuspid regurgitation signal is inadequate for assessing  PA pressure.   3. The mitral valve is grossly normal. No evidence of mitral valve  regurgitation. No evidence of mitral stenosis.   4. The aortic valve has an indeterminant number of cusps. There is  moderate calcification of the aortic valve. There is moderate thickening  of the aortic valve. Aortic valve regurgitation is not visualized. Aortic  valve sclerosis/calcification is  present, without any evidence of aortic stenosis.   5. The inferior vena cava is normal in size with greater than 50%  respiratory variability, suggesting right atrial pressure of 3 mmHg.      Cardiac cath  10/17/22:   Mid RCA lesion is 50% stenosed.   Mid LM lesion is 60% stenosed.   1.  Angiographically significant mid left main disease however IVUS interrogation showed minimal luminal area of greater than 6 mm and RFR (with the guiding catheter disengaged) was 1.0. 2.  Diffusely diseased right coronary artery with FFR angiography assessment of 0.72 at its midportion however the FFR decreases throughout the vessel indicating diffuse disease. 3.  LVEDP of 2 mmHg.   Recommendation: Medical therapy.  It is unlikely that the diffusely diseased right coronary artery is causing dyspnea or chest pain.  If PCI of the right coronary artery were pursued, very long segment of stents would be required.    Neuro/Psych negative neurological ROS  negative psych ROS   GI/Hepatic ,neg GERD  ,,(+)     (-) substance abuse    Endo/Other  neg diabetes    Renal/GU negative Renal ROS     Musculoskeletal   Abdominal   Peds  Hematology   Anesthesia Other Findings Past Medical History: No date: Adenomatous colon polyp No date: Atrial fibrillation (HCC) No date: Centrilobular emphysema (HCC) No date: COPD (chronic obstructive pulmonary disease) (HCC) No date: Coronary artery disease No date: History of DVT of lower extremity No date: Hypercholesterolemia No date: Hypercholesterolemia dx'd 03/2012: prostate ca     Comment:  surg only No date: Skin cancer No date: Tobacco dependence  Reproductive/Obstetrics                              Anesthesia Physical Anesthesia Plan  ASA: 3  Anesthesia Plan: General   Post-op Pain Management:  Induction: Intravenous  PONV Risk Score and Plan: 1 and Ondansetron  and Dexamethasone   Airway Management Planned: Oral ETT and Video Laryngoscope Planned  Additional Equipment: None  Intra-op Plan:   Post-operative Plan: Extubation in OR  Informed Consent: I have reviewed the patients History and Physical, chart, labs and  discussed the procedure including the risks, benefits and alternatives for the proposed anesthesia with the patient or authorized representative who has indicated his/her understanding and acceptance.     Dental advisory given  Plan Discussed with: CRNA and Surgeon  Anesthesia Plan Comments: (Discussed risks of anesthesia with patient, including PONV, sore throat, lip/dental/eye damage. Rare risks discussed as well, such as cardiorespiratory and neurological sequelae, and allergic reactions. Discussed the role of CRNA in patient's perioperative care. Patient understands. Patient counseled on benefits of smoking cessation, and increased perioperative risks associated with continued smoking. )         Anesthesia Quick Evaluation

## 2023-10-28 ENCOUNTER — Encounter (HOSPITAL_COMMUNITY)
Admission: RE | Disposition: A | Discharge status: Home / Self Care | Payer: Self-pay | Source: Home / Self Care | Attending: Emergency Medicine

## 2023-10-28 ENCOUNTER — Encounter (HOSPITAL_COMMUNITY): Payer: Self-pay | Admitting: Emergency Medicine

## 2023-10-28 ENCOUNTER — Other Ambulatory Visit: Payer: Self-pay

## 2023-10-28 ENCOUNTER — Ambulatory Visit (HOSPITAL_COMMUNITY)
Admission: RE | Admit: 2023-10-28 | Discharge: 2023-10-28 | Disposition: A | Discharge status: Home / Self Care | Attending: Emergency Medicine | Admitting: Emergency Medicine

## 2023-10-28 ENCOUNTER — Ambulatory Visit (HOSPITAL_COMMUNITY)

## 2023-10-28 ENCOUNTER — Ambulatory Visit (HOSPITAL_BASED_OUTPATIENT_CLINIC_OR_DEPARTMENT_OTHER): Payer: Self-pay | Admitting: Vascular Surgery

## 2023-10-28 ENCOUNTER — Ambulatory Visit (HOSPITAL_COMMUNITY): Payer: Self-pay | Admitting: Vascular Surgery

## 2023-10-28 DIAGNOSIS — Z86718 Personal history of other venous thrombosis and embolism: Secondary | ICD-10-CM | POA: Insufficient documentation

## 2023-10-28 DIAGNOSIS — Z85828 Personal history of other malignant neoplasm of skin: Secondary | ICD-10-CM | POA: Insufficient documentation

## 2023-10-28 DIAGNOSIS — F17200 Nicotine dependence, unspecified, uncomplicated: Secondary | ICD-10-CM

## 2023-10-28 DIAGNOSIS — I4891 Unspecified atrial fibrillation: Secondary | ICD-10-CM | POA: Diagnosis not present

## 2023-10-28 DIAGNOSIS — Z8546 Personal history of malignant neoplasm of prostate: Secondary | ICD-10-CM | POA: Insufficient documentation

## 2023-10-28 DIAGNOSIS — J432 Centrilobular emphysema: Secondary | ICD-10-CM | POA: Insufficient documentation

## 2023-10-28 DIAGNOSIS — I739 Peripheral vascular disease, unspecified: Secondary | ICD-10-CM | POA: Diagnosis not present

## 2023-10-28 DIAGNOSIS — E7800 Pure hypercholesterolemia, unspecified: Secondary | ICD-10-CM | POA: Insufficient documentation

## 2023-10-28 DIAGNOSIS — I2510 Atherosclerotic heart disease of native coronary artery without angina pectoris: Secondary | ICD-10-CM | POA: Diagnosis not present

## 2023-10-28 DIAGNOSIS — Z79899 Other long term (current) drug therapy: Secondary | ICD-10-CM | POA: Insufficient documentation

## 2023-10-28 DIAGNOSIS — Z7951 Long term (current) use of inhaled steroids: Secondary | ICD-10-CM | POA: Insufficient documentation

## 2023-10-28 DIAGNOSIS — R911 Solitary pulmonary nodule: Secondary | ICD-10-CM | POA: Diagnosis present

## 2023-10-28 DIAGNOSIS — Z9079 Acquired absence of other genital organ(s): Secondary | ICD-10-CM | POA: Insufficient documentation

## 2023-10-28 DIAGNOSIS — Z7901 Long term (current) use of anticoagulants: Secondary | ICD-10-CM | POA: Insufficient documentation

## 2023-10-28 DIAGNOSIS — F17210 Nicotine dependence, cigarettes, uncomplicated: Secondary | ICD-10-CM | POA: Diagnosis not present

## 2023-10-28 DIAGNOSIS — J449 Chronic obstructive pulmonary disease, unspecified: Secondary | ICD-10-CM | POA: Diagnosis not present

## 2023-10-28 HISTORY — PX: VIDEO BRONCHOSCOPY WITH ENDOBRONCHIAL NAVIGATION: SHX6175

## 2023-10-28 HISTORY — DX: Unspecified atrial fibrillation: I48.91

## 2023-10-28 HISTORY — DX: Atherosclerotic heart disease of native coronary artery without angina pectoris: I25.10

## 2023-10-28 LAB — CBC
HCT: 43.5 % (ref 39.0–52.0)
Hemoglobin: 14.4 g/dL (ref 13.0–17.0)
MCH: 33.3 pg (ref 26.0–34.0)
MCHC: 33.1 g/dL (ref 30.0–36.0)
MCV: 100.5 fL — ABNORMAL HIGH (ref 80.0–100.0)
Platelets: 190 K/uL (ref 150–400)
RBC: 4.33 MIL/uL (ref 4.22–5.81)
RDW: 11.9 % (ref 11.5–15.5)
WBC: 5.9 K/uL (ref 4.0–10.5)
nRBC: 0 % (ref 0.0–0.2)

## 2023-10-28 LAB — BASIC METABOLIC PANEL WITH GFR
Anion gap: 9 (ref 5–15)
BUN: 10 mg/dL (ref 8–23)
CO2: 28 mmol/L (ref 22–32)
Calcium: 9.1 mg/dL (ref 8.9–10.3)
Chloride: 103 mmol/L (ref 98–111)
Creatinine, Ser: 0.9 mg/dL (ref 0.61–1.24)
GFR, Estimated: >60 mL/min (ref >60)
Glucose, Bld: 104 mg/dL — ABNORMAL HIGH (ref 70–99)
Potassium: 4.3 mmol/L (ref 3.5–5.1)
Sodium: 140 mmol/L (ref 135–145)

## 2023-10-28 SURGERY — VIDEO BRONCHOSCOPY WITH ENDOBRONCHIAL NAVIGATION
Anesthesia: General | Laterality: Left

## 2023-10-28 MED ORDER — ALBUMIN HUMAN 5 % IV SOLN: INTRAVENOUS | Status: DC | PRN

## 2023-10-28 MED ORDER — LIDOCAINE 2% (20 MG/ML) 5 ML SYRINGE
INTRAMUSCULAR | Status: DC | PRN
  Administered 2023-10-28 (×2): 80 mg via INTRAVENOUS

## 2023-10-28 MED ORDER — PROPOFOL 10 MG/ML IV BOLUS
INTRAVENOUS | Status: DC | PRN
  Administered 2023-10-28 (×2): 140 mg via INTRAVENOUS

## 2023-10-28 MED ORDER — EPHEDRINE SULFATE-NACL 50-0.9 MG/10ML-% IV SOSY
PREFILLED_SYRINGE | INTRAVENOUS | Status: DC | PRN
  Administered 2023-10-28 (×8): 10 mg via INTRAVENOUS

## 2023-10-28 MED ORDER — LACTATED RINGERS IV SOLN: INTRAVENOUS | Status: DC

## 2023-10-28 MED ORDER — PROPOFOL 500 MG/50ML IV EMUL
INTRAVENOUS | Status: DC | PRN
  Administered 2023-10-28: 125 ug/kg/min via INTRAVENOUS
  Administered 2023-10-28 (×2): 75 ug/kg/min via INTRAVENOUS
  Administered 2023-10-28: 125 ug/kg/min via INTRAVENOUS

## 2023-10-28 MED ORDER — DEXAMETHASONE SODIUM PHOSPHATE 10 MG/ML IJ SOLN
INTRAMUSCULAR | Status: DC | PRN
  Administered 2023-10-28 (×2): 10 mg via INTRAVENOUS

## 2023-10-28 MED ORDER — SUGAMMADEX SODIUM 200 MG/2ML IV SOLN
INTRAVENOUS | Status: DC | PRN
  Administered 2023-10-28 (×4): 200 mg via INTRAVENOUS

## 2023-10-28 MED ORDER — ONDANSETRON HCL 4 MG/2ML IJ SOLN
INTRAMUSCULAR | Status: DC | PRN
  Administered 2023-10-28 (×2): 4 mg via INTRAVENOUS

## 2023-10-28 MED ORDER — PHENYLEPHRINE HCL-NACL 20-0.9 MG/250ML-% IV SOLN
INTRAVENOUS | Status: DC | PRN
  Administered 2023-10-28 (×2): 50 ug/min via INTRAVENOUS

## 2023-10-28 MED ORDER — FENTANYL CITRATE (PF) 250 MCG/5ML IJ SOLN
INTRAMUSCULAR | Status: DC | PRN
  Administered 2023-10-28 (×2): 50 ug via INTRAVENOUS

## 2023-10-28 MED ORDER — ROCURONIUM BROMIDE 10 MG/ML (PF) SYRINGE
PREFILLED_SYRINGE | INTRAVENOUS | Status: DC | PRN
  Administered 2023-10-28: 20 mg via INTRAVENOUS
  Administered 2023-10-28 (×2): 50 mg via INTRAVENOUS
  Administered 2023-10-28 (×2): 30 mg via INTRAVENOUS
  Administered 2023-10-28: 20 mg via INTRAVENOUS
  Administered 2023-10-28 (×2): 10 mg via INTRAVENOUS

## 2023-10-28 MED ORDER — CHLORHEXIDINE GLUCONATE 0.12 % MT SOLN: 15.0000 mL | Freq: Once | OROMUCOSAL | Status: AC

## 2023-10-28 MED ORDER — PHENYLEPHRINE 80 MCG/ML (10ML) SYRINGE FOR IV PUSH (FOR BLOOD PRESSURE SUPPORT)
PREFILLED_SYRINGE | INTRAVENOUS | Status: DC | PRN
  Administered 2023-10-28 (×4): 160 ug via INTRAVENOUS
  Administered 2023-10-28: 80 ug via INTRAVENOUS
  Administered 2023-10-28 (×2): 50 ug via INTRAVENOUS
  Administered 2023-10-28: 80 ug via INTRAVENOUS

## 2023-10-28 MED ORDER — GLYCOPYRROLATE PF 0.2 MG/ML IJ SOSY
PREFILLED_SYRINGE | INTRAMUSCULAR | Status: DC | PRN
  Administered 2023-10-28 (×2): .1 mg via INTRAVENOUS

## 2023-10-28 MED ORDER — CHLORHEXIDINE GLUCONATE 0.12 % MT SOLN
OROMUCOSAL | Status: AC
  Administered 2023-10-28 (×2): 15 mL via OROMUCOSAL
  Filled 2023-10-28: qty 15

## 2023-10-28 MED ORDER — ALBUMIN HUMAN 5 % IV SOLN
INTRAVENOUS | Status: AC
  Filled 2023-10-28: qty 250

## 2023-10-28 SURGICAL SUPPLY — 37 items
ADAPTER BRONCHOSCOPE OLYMPUS (ADAPTER) ×2 IMPLANT
ADAPTER VALVE BIOPSY EBUS (MISCELLANEOUS) IMPLANT
BAG COUNTER SPONGE SURGICOUNT (BAG) ×2 IMPLANT
BRUSH CYTOL CELLEBRITY 1.5X140 (MISCELLANEOUS) ×2 IMPLANT
BRUSH SUPERTRAX BIOPSY (INSTRUMENTS) IMPLANT
BRUSH SUPERTRAX NDL-TIP CYTO (INSTRUMENTS) ×2 IMPLANT
CANISTER SUCTION 3000ML PPV (SUCTIONS) ×2 IMPLANT
CNTNR URN SCR LID CUP LEK RST (MISCELLANEOUS) ×2 IMPLANT
COVER BACK TABLE 60X90IN (DRAPES) ×2 IMPLANT
FILTER STRAW FLUID ASPIR (MISCELLANEOUS) IMPLANT
FORCEPS BIOP 1.5 SINGLE USE (MISCELLANEOUS) ×2 IMPLANT
FORCEPS BIOP SUPERTRX PREMAR (INSTRUMENTS) ×2 IMPLANT
GAUZE SPONGE 4X4 12PLY STRL (GAUZE/BANDAGES/DRESSINGS) ×2 IMPLANT
GLOVE BIO SURGEON STRL SZ7.5 (GLOVE) ×4 IMPLANT
GOWN STRL REUS W/ TWL LRG LVL3 (GOWN DISPOSABLE) ×4 IMPLANT
KIT CLEAN ENDO COMPLIANCE (KITS) ×3 IMPLANT
KIT LOCATABLE GUIDE (CANNULA) IMPLANT
KIT MARKER FIDUCIAL DELIVERY (KITS) IMPLANT
KIT TURNOVER KIT B (KITS) ×2 IMPLANT
MARKER SKIN DUAL TIP RULER LAB (MISCELLANEOUS) ×2 IMPLANT
NDL SUPERTRX PREMARK BIOPSY (NEEDLE) ×2 IMPLANT
NEEDLE SUPERTRX PREMARK BIOPSY (NEEDLE) ×1 IMPLANT
NS IRRIG 1000ML POUR BTL (IV SOLUTION) ×2 IMPLANT
OIL SILICONE PENTAX (PARTS (SERVICE/REPAIRS)) ×2 IMPLANT
PAD ARMBOARD POSITIONER FOAM (MISCELLANEOUS) ×4 IMPLANT
PATCHES PATIENT (LABEL) ×6 IMPLANT
SYR 20ML ECCENTRIC (SYRINGE) ×2 IMPLANT
SYR 20ML LL LF (SYRINGE) ×2 IMPLANT
SYR 50ML SLIP (SYRINGE) ×3 IMPLANT
TOWEL GREEN STERILE FF (TOWEL DISPOSABLE) ×2 IMPLANT
TRAP SPECIMEN MUCUS 40CC (MISCELLANEOUS) IMPLANT
TUBE CONNECTING 20X1/4 (TUBING) ×2 IMPLANT
UNDERPAD 30X36 HEAVY ABSORB (UNDERPADS AND DIAPERS) ×2 IMPLANT
VALVE BIOPSY SINGLE USE (MISCELLANEOUS) ×3 IMPLANT
VALVE SUCTION BRONCHIO DISP (MISCELLANEOUS) ×3 IMPLANT
WATER STERILE IRR 1000ML POUR (IV SOLUTION) ×2 IMPLANT
superlock fiducial marker IMPLANT

## 2023-10-28 NOTE — Op Note (Signed)
 Video Bronchoscopy with Robotic Assisted Bronchoscopic Navigation   Date of Operation: 10/28/2023   Pre-op Diagnosis: Left lower lobe nodule  Post-op Diagnosis: Same  Surgeon: Lamar Chris  Assistants: None  Anesthesia: General endotracheal anesthesia  Operation: Flexible video fiberoptic bronchoscopy with robotic assistance and biopsies.  Estimated Blood Loss: Minimal  Complications: None  Indications and History: Adam Gilmore is a 73 y.o. male with history of tobacco use.  He was found to have a left lower lobe pulmonary nodule on screening CT scan of the chest.  This had hypermetabolism on PET scan.  Recommendation made to achieve a tissue diagnosis via robotic assisted navigational bronchoscopy.  The risks, benefits, complications, treatment options and expected outcomes were discussed with the patient.  The possibilities of pneumothorax, pneumonia, reaction to medication, pulmonary aspiration, perforation of a viscus, bleeding, failure to diagnose a condition and creating a complication requiring transfusion or operation were discussed with the patient who freely signed the consent.    Description of Procedure: The patient was seen in the Preoperative Area, was examined and was deemed appropriate to proceed.  The patient was taken to [facility] Endoscopy room 3, identified as Lamar LITTIE Ream and the procedure verified as Flexible Video Fiberoptic Bronchoscopy.  A Time Out was held and the above information confirmed.   Prior to the date of the procedure a high-resolution CT scan of the chest was performed. Utilizing ION software program a virtual tracheobronchial tree was generated to allow the creation of distinct navigation pathways to the patient's parenchymal abnormalities. After being taken to the operating room general anesthesia was initiated and the patient  was orally intubated. The video fiberoptic bronchoscope was introduced via the endotracheal tube and a general inspection  was performed which showed normal right and left lung anatomy. Aspiration of the bilateral mainstems was completed to remove any remaining secretions. Robotic catheter inserted into patient's endotracheal tube.   Target #1 left lower lobe pulmonary nodule: The distinct navigation pathways prepared prior to this procedure were then utilized to navigate to patient's lesion identified on CT scan. The robotic catheter was secured into place and the vision probe was withdrawn.  Lesion location was approximated using fluoroscopy.  Local registration and targeting was performed using [employer] Cios mobile C-arm three-dimensional imaging. Under fluoroscopic guidance transbronchial needle biopsies and transbronchial cryoprobe biopsies were performed to be sent for cytology and pathology.  Needle-in-lesion was confirmed using Cios mobile C-arm.  Under fluoroscopic guidance a single fiducial marker was placed adjacent to the nodule.    At the end of the procedure a general airway inspection was performed and there was no evidence of active bleeding. The bronchoscope was removed.  The patient tolerated the procedure well. There was no significant blood loss and there were no obvious complications. A post-procedural chest x-ray is pending.  Samples Target #1: 1. Transbronchial Wang needle biopsies from left lower lobe nodule 2. Transbronchial forceps biopsies from left lower lobe nodule    Plans:  The patient will be discharged from the PACU to home when recovered from anesthesia and after chest x-ray is reviewed. We will review the cytology, pathology and microbiology results with the patient when they become available. Outpatient followup will be with CANDIE Lites, NP and Dr Chris.   Lamar Chris, MD, PhD 10/28/2023, 1:54 PM [facility] [phone] or if no answer before 7:00PM call [phone] For any issues after 7:00PM please call eLink [phone]

## 2023-10-28 NOTE — Interval H&P Note (Signed)
 History and Physical Interval Note:  10/28/2023 10:48 AM  Adam Gilmore  has presented today for surgery, with the diagnosis of lung nodule.  The various methods of treatment have been discussed with the patient and family. After consideration of risks, benefits and other options for treatment, the patient has consented to  Procedure(s): VIDEO BRONCHOSCOPY WITH ENDOBRONCHIAL NAVIGATION (Left) as a surgical intervention.  The patient's history has been reviewed, patient examined, no change in status, stable for surgery.  I have reviewed the patient's chart and labs.  Questions were answered to the patient's satisfaction.     Adam Gilmore

## 2023-10-28 NOTE — Op Note (Signed)
 Procedure Note  Patient: Adam Gilmore  [employer] Cios mobile C-arm was utilized to identify and biopsy left lower lobe pulmonary nodule.  Needle-in-lesion was confirmed using real-time Cios imaging, and images were uploaded to PACS.   Lamar Chris, MD, PhD 10/28/2023, 1:58 PM [facility] [phone] or if no answer before 7:00PM call [phone] For any issues after 7:00PM please call eLink [phone]

## 2023-10-28 NOTE — Discharge Instructions (Addendum)
 Flexible Bronchoscopy, Care After This sheet gives you information about how to care for yourself after your test. Your doctor may also give you more specific instructions. If you have problems or questions, contact your doctor. Follow these instructions at home: Eating and drinking When you are wide awake, your numbness is gone and your cough and gag reflexes have come back, you may: Start eating only soft foods. Slowly drink liquids. Six hours after the test, go back to your normal diet. Driving Do not drive for 24 hours if you were given a medicine to help you relax (sedative). Do not drive or use heavy machinery while taking prescription pain medicine. General instructions Take over-the-counter and prescription medicines only as told by your doctor. Return to your normal activities as told. Ask what activities are safe for you. Do not use any products that have nicotine or tobacco in them. This includes cigarettes and e-cigarettes. If you need help quitting, ask your doctor. Keep all follow-up visits as told by your doctor. This is important. It is very important if you had a tissue sample (biopsy) taken. Get help right away if: You have shortness of breath that gets worse. You get light-headed. You feel like you are going to pass out (faint). You have chest pain. You cough up: More than a little blood. More blood than before. Summary Do not use cigarettes. Do not use e-cigarettes. Seek care in the Emergency Department right away if you have chest pain or shortness of breath. Call or MyChart Message our office for any questions or problems at [phone].  Okay to restart Xarelto  on 10/29/2023   This information is not intended to replace advice given to you by your health care provider. Make sure you discuss any questions you have with your health care provider.

## 2023-10-28 NOTE — Anesthesia Postprocedure Evaluation (Signed)
 Anesthesia Post Note  Patient: Adam Gilmore  Procedure(s) Performed: VIDEO BRONCHOSCOPY WITH ENDOBRONCHIAL NAVIGATION (Left)     Patient location during evaluation: PACU Anesthesia Type: General Level of consciousness: awake and alert Pain management: pain level controlled Vital Signs Assessment: post-procedure vital signs reviewed and stable Respiratory status: spontaneous breathing, nonlabored ventilation, respiratory function stable and patient connected to nasal cannula oxygen Cardiovascular status: blood pressure returned to baseline and stable Postop Assessment: no apparent nausea or vomiting Anesthetic complications: no   No notable events documented.  Last Vitals:  Vitals:   10/28/23 1415 10/28/23 1420  BP:  (!) 111/58  Pulse: 66 69  Resp: (!) 23 (!) 21  Temp:    SpO2: 94% 93%    Last Pain:  Vitals:   10/28/23 1420  TempSrc:   PainSc: 0-No pain                 Rome Ade

## 2023-10-28 NOTE — Anesthesia Procedure Notes (Signed)
 Procedure Name: Intubation Date/Time: 10/28/2023 12:41 PM  Performed by: Mollie Olivia SAUNDERS, CRNAPre-anesthesia Checklist: Patient identified, Emergency Drugs available, Suction available and Patient being monitored Patient Re-evaluated:Patient Re-evaluated prior to induction Oxygen Delivery Method: Circle system utilized Preoxygenation: Pre-oxygenation with 100% oxygen Induction Type: IV induction Ventilation: Mask ventilation without difficulty and Oral airway inserted - appropriate to patient size Laryngoscope Size: Glidescope and 4 Grade View: Grade I Tube type: Oral Tube size: 8.5 mm Number of attempts: 1 Airway Equipment and Method: Stylet and Oral airway Placement Confirmation: ETT inserted through vocal cords under direct vision, positive ETCO2 and breath sounds checked- equal and bilateral Secured at: 23 cm Tube secured with: Tape Dental Injury: Teeth and Oropharynx as per pre-operative assessment

## 2023-10-28 NOTE — Transfer of Care (Signed)
 Immediate Anesthesia Transfer of Care Note  Patient: Adam Gilmore  Procedure(s) Performed: VIDEO BRONCHOSCOPY WITH ENDOBRONCHIAL NAVIGATION (Left)  Patient Location: PACU  Anesthesia Type:General  Level of Consciousness: awake, alert , and oriented  Airway & Oxygen Therapy: Patient Spontanous Breathing and Patient connected to nasal cannula oxygen  Post-op Assessment: Report given to RN, Post -op Vital signs reviewed and stable, and Patient moving all extremities X 4  Post vital signs: Reviewed and stable  Last Vitals:  Vitals Value Taken Time  BP 105/56 10/28/23 14:10  Temp    Pulse 66 10/28/23 14:13  Resp 22 10/28/23 14:13  SpO2 94 % 10/28/23 14:13  Vitals shown include unfiled device data.  Last Pain:  Vitals:   10/28/23 1405  TempSrc:   PainSc: 0-No pain         Complications: No notable events documented.

## 2023-10-31 LAB — CYTOLOGY - NON PAP

## 2023-11-03 ENCOUNTER — Encounter: Payer: Self-pay | Admitting: Acute Care

## 2023-11-03 ENCOUNTER — Ambulatory Visit: Admitting: Acute Care

## 2023-11-03 VITALS — BP 112/58 | HR 71 | Temp 98.3°F | Ht 72.0 in | Wt 154.6 lb

## 2023-11-03 DIAGNOSIS — R918 Other nonspecific abnormal finding of lung field: Secondary | ICD-10-CM

## 2023-11-03 DIAGNOSIS — R942 Abnormal results of pulmonary function studies: Secondary | ICD-10-CM

## 2023-11-03 DIAGNOSIS — Z9889 Other specified postprocedural states: Secondary | ICD-10-CM

## 2023-11-03 DIAGNOSIS — F17210 Nicotine dependence, cigarettes, uncomplicated: Secondary | ICD-10-CM

## 2023-11-03 DIAGNOSIS — R911 Solitary pulmonary nodule: Secondary | ICD-10-CM

## 2023-11-03 DIAGNOSIS — J449 Chronic obstructive pulmonary disease, unspecified: Secondary | ICD-10-CM

## 2023-11-03 NOTE — Patient Instructions (Signed)
 It is good to see you today. I am glad you have done well after the procedure.  We will do a 3 month short term follow up CT scan to ensure stability/ resolution of the lung nodule. You will get a call to schedule this closer to the time. You will follow up with me in the office 1-2 weeks after the scan to review results.  Call sooner for any unexplained weight loss or blood in your sputum.  Continue Trelegy as you have been doing. Rinse mouth after use. Albuterol  as needed for breakthrough shortness of breath or wheezing. Please work on quitting smoking. You can receive free nicotine replacement therapy (patches, gum, or mints) by calling 1-800-QUIT NOW. Please call so we can get you on the path to becoming a non-smoker. I know it is hard, but you can do this!  Hypnosis for smoking cessation  [employer] [phone]  Acupuncture for smoking cessation  [facility] [phone]   Call if you need us  sooner. Please contact office for sooner follow up if symptoms do not improve or worsen or seek emergency care

## 2023-11-03 NOTE — Progress Notes (Signed)
 History of Present Illness Adam Gilmore is a 73 y.o. male current every day smoker followed through the lung cancer screening program, referred for consult for abnormal lung cancer screening. He will be followed by Dr. Shelah.    Synopsis 73 year old male current every day smoker ( 56 pack year smoking history) with a left lower lobe lung nodule , with recent PET scan showing abnormal uptake. Three additional small nodules exhibit low-level uptake, with the most intense being a 5 mm nodule in the left upper lobe. He has experienced significant weight loss, approximately 30 pounds over the last few years, with 10 pounds lost in the past six months.  Recommendation made to achieve a tissue diagnosis via robotic assisted navigational bronchoscopy.  He went for bronchoscopy with biopsies on 10/28/2023. He is here to review the biopsy results, and to ensure he has done well after the procedure. .   11/03/2023 Discussed the use of AI scribe software for clinical note transcription with the patient, who gave verbal consent to proceed.  History of Present Illness Pt. Presents for post bronchoscopy with biopsies on 10/28/2023. He states he has done well since the procedure. No bleeding, fever, discolored secretions, worsening dyspnea or adverse reaction to anesthesia. He did cough up some old  blood from Thursday until Saturday. It was very small amounts. He has resumed his Xaralto.  He has no current respiratory issues.   We have reviewed the results of his biopsy. It was negative for malignancy. I am still concerned about the potential for this being malignant. Plan will be for a short term 3 month follow up CT chest with close follow up to ensure stability. If there is additional growth we will consider repeat bronch vs referral to radiation oncology.     Test Results: Cytology 10/17/2023 FINAL MICROSCOPIC DIAGNOSIS:  A. LUNG, LLL, NEEDLE ASPIRATION  BIOPSIES:  - No malignant cells identified      PET scan 10/09/2023 Dominant spiculated nodule left lower lobe has significant abnormal uptake of maximum SUV of 5.3 and is worrisome for neoplasm. Recommend further evaluation.   There 3 other small nodules elsewhere in the lungs which have developed over time and have low-level uptake. The most intense is in the left upper lung measuring maximum SUV of 2.0 and measures 5 mm in size. This would be suspicious for size based on this level of uptake. Other lesions could be followed.   Low-dose CT 09/10/2023 Lung-RADS 4B, suspicious. Additional imaging evaluation or consultation with Pulmonology or Thoracic Surgery recommended. 2. 19.9 mm irregular spiculated nodular opacity in the left lower lobe confluent with and retracting the major fissure. Even in retrospect there was no lesion visible at this location on the study from 6 months ago. While the rapid interval appearance would be more in keeping with an infectious/inflammatory etiology, qualitative imaging features are highly suspicious for primary bronchogenic carcinoma. 3. Stable left adrenal adenoma. 4. Aortic Atherosclerosis (ICD10-I70.0) and Emphysema (ICD10-J43.9).      Latest Ref Rng & Units 10/28/2023   11:40 AM 10/15/2022    3:48 PM 04/07/2021    5:05 AM  CBC  WBC 4.0 - 10.5 K/uL 5.9  5.7  5.0   Hemoglobin 13.0 - 17.0 g/dL 85.5  84.6  85.3   Hematocrit 39.0 - 52.0 % 43.5  43.9  43.6   Platelets 150 - 400 K/uL 190  214  209        Latest Ref Rng & Units 10/28/2023  11:40 AM 10/15/2022    3:48 PM 04/07/2021    5:05 AM  BMP  Glucose 70 - 99 mg/dL 895  82  874   BUN 8 - 23 mg/dL 10  10  15    Creatinine 0.61 - 1.24 mg/dL 9.09  9.05  9.16   BUN/Creat Ratio 10 - 24  11    Sodium 135 - 145 mmol/L 140  147  138   Potassium 3.5 - 5.1 mmol/L 4.3  5.3  3.6   Chloride 98 - 111 mmol/L 103  103  100   CO2 22 - 32 mmol/L 28  29  31    Calcium  8.9 - 10.3 mg/dL 9.1  9.5  8.4     BNP    Component Value Date/Time   BNP 31.3  04/02/2021 1626    ProBNP No results found for: PROBNP  PFT    Component Value Date/Time   FEV1PRE 1.13 03/26/2022 1452   FEV1POST 1.24 03/26/2022 1452   FVCPRE 2.88 03/26/2022 1452   FVCPOST 3.06 03/26/2022 1452   TLC 11.07 03/26/2022 1452   DLCOUNC 9.21 03/26/2022 1452   PREFEV1FVCRT 39 03/26/2022 1452   PSTFEV1FVCRT 41 03/26/2022 1452    DG Chest Port 1 View Result Date: 10/28/2023 CLINICAL DATA:  Status post bronchoscopy EXAM: PORTABLE CHEST 1 VIEW COMPARISON:  04/02/2021, PET CT 10/09/2023 FINDINGS: Emphysema and chronic scarring at the right apex. Vague left midlung density with marker corresponding to spiculated mass on CT and history of bronchoscopy with biopsy. Mild diffuse interstitial opacity in the left lower lung could be due to mild postprocedural change. Stable cardiomediastinal silhouette with aortic atherosclerosis. No definitive pneumothorax on this exam. Aortic atherosclerosis IMPRESSION: 1. No definitive pneumothorax on this exam. 2. Vague left midlung density with marker corresponding to spiculated mass on CT and history of bronchoscopy with biopsy. Mild diffuse interstitial opacity in the left lower lung could be due to mild postprocedural change. Electronically Signed   By: Luke Bun M.D.   On: 10/28/2023 15:14   DG C-ARM BRONCHOSCOPY Result Date: 10/28/2023 C-ARM BRONCHOSCOPY: Fluoroscopy was utilized by the requesting physician.  No radiographic interpretation.   DG C-Arm 1-60 Min-No Report Result Date: 10/28/2023 Fluoroscopy was utilized by the requesting physician.  No radiographic interpretation.   NM PET Image Initial (PI) Skull Base To Thigh Result Date: 10/10/2023 CLINICAL DATA:  Initial treatment strategy for lung nodule. EXAM: NUCLEAR MEDICINE PET SKULL BASE TO THIGH TECHNIQUE: 7.49 mCi F-18 FDG was injected intravenously. Full-ring PET imaging was performed from the skull base to thigh after the radiotracer. CT data was obtained and used for  attenuation correction and anatomic localization. Fasting blood glucose: 103 mg/dl COMPARISON:  Noncontrast CT 09/10/2023 and older. Abdomen pelvis CT 2019 FINDINGS: Mediastinal blood pool activity: SUV max 2.3 Liver activity: SUV max 2.5 NECK: No specific abnormal uptake identified in the neck including along lymph node change of the submandibular, posterior triangle or internal jugular region. Near symmetric uptake of the visualized intracranial compartment. Incidental CT findings: The parotid glands, submandibular glands are grossly preserved. There is some enlargement of the left thyroid lobe without abnormal uptake. Streak artifact related to the patient's dental hardware. Visualized paranasal sinuses and mastoid air cells are clear. Scattered vascular calcifications are seen. CHEST: There are 2 hypermetabolic left-sided lung nodules. Larger focus is seen in the superior segment left lower lobe abutting the interlobar fissure with traction and spiculation. On the prior CT this measured 20 mm. This has maximum SUV value of  5.3 and view worrisome for neoplasm. There is also some mild uptake within small spiculated nodule left upper lobe of maximum SUV of 2.0. This nodule on image 65 of series 4 measures 5 mm. This is significant amount of uptake for nodule of this size. In addition this nodule was 1-2 mm going back to a study of December 2024. Additional small asymmetric area of uptake with maximum SUV value of 0.9 corresponding to a nodular area in the right lung, posteroinferior right upper lobe measuring 3 mm on series 4, image 83. This also was not present in December 2024 but is simply too small to further delineate. Similar focus medially right upper lobe with nodular focus on image 71 of series 4 measuring 5 mm and minimal uptake of maximum SUV of 1.3. There is some other areas of nodularity in the lungs which do not show abnormal uptake. There is some mild uptake along a bandlike opacity along the  posterior right lung apex, unchanged from previous examinations and could represent areas of scarring and fibrotic changes. This been present since at least 2022. No specific abnormal uptake above blood pool in the axillary regions, hilum or mediastinum. Incidental CT findings: Advanced emphysematous lung changes. No pleural effusion, consolidation or pneumothorax. Coronary artery calcifications are seen. Heart is nonenlarged. No pericardial effusion. There is significant calcifications along the aortic valve. Moderate calcifications seen along the thoracic aorta. Layering debris in the trachea. ABDOMEN/PELVIS: There is some mild asymmetric uptake along the stomach diffusely. The stomach is underdistended and there is some question of fold thickening. Please correlate for any symptoms of gastritis or other process. Otherwise there is physiologic distribution radiotracer along the parenchymal organs, bowel and renal collecting systems. No specific abnormal nodal uptake. Incidental CT findings: Breathing motion. Few splenic calcifications. Grossly the liver, pancreas and right adrenal gland are preserved. There is low-attenuation nodularity of the left adrenal gland consistent with an adenoma measuring 37 mm. Hounsfield units of less than 0 and no specific abnormal uptake. Gallbladder is present. No renal or ureteral stones. Large bowel has a normal course and caliber with scattered colonic stool. Small bowel stool appearance. Diffuse vascular calcifications. Minimal ectasia of the abdominal aorta but under 3 cm. Normal caliber IVC. SKELETON: No abnormal uptake along the visualized osseous structures. Incidental CT findings: Scattered degenerative changes. IMPRESSION: Dominant spiculated nodule left lower lobe has significant abnormal uptake of maximum SUV of 5.3 and is worrisome for neoplasm. Recommend further evaluation. There 3 other small nodules elsewhere in the lungs which have developed over time and have  low-level uptake. The most intense is in the left upper lung measuring maximum SUV of 2.0 and measures 5 mm in size. This would be suspicious for size based on this level of uptake. Other lesions could be followed. Mild asymmetric uptake involving the stomach diffusely with fold thickening. Stomach is underdistended. Please correlate with any symptoms. No additional areas of abnormal radiotracer uptake. Advanced emphysematous changes. Right apical lung scarring, stable from previous. Diffuse colonic stool. Left adrenal adenoma Electronically Signed   By: Ranell Bring M.D.   On: 10/10/2023 14:36     Past medical hx Past Medical History:  Diagnosis Date   Adenomatous colon polyp    Atrial fibrillation (HCC)    Centrilobular emphysema (HCC)    COPD (chronic obstructive pulmonary disease) (HCC)    Coronary artery disease    History of DVT of lower extremity    Hypercholesterolemia    Hypercholesterolemia    prostate  ca dx'd 03/2012   surg only   Skin cancer    Tobacco dependence      Social History   Tobacco Use   Smoking status: Every Day    Current packs/day: 1.00    Average packs/day: 1 pack/day for 56.0 years (56.0 ttl pk-yrs)    Types: Cigarettes   Smokeless tobacco: Never   Tobacco comments:    1 pack a day 11/03/2023 KRD  Vaping Use   Vaping status: Never Used  Substance Use Topics   Alcohol use: Yes    Comment: socially   Drug use: No    Mr.Blaney reports that he has been smoking cigarettes. He has a 56 pack-year smoking history. He has never used smokeless tobacco. He reports current alcohol use. He reports that he does not use drugs.  Tobacco Cessation: Ready to quit: Not Answered Counseling given: Not Answered Tobacco comments: 1 pack a day 11/03/2023 KRD Current every day smoker Current every day smoker , I spent 3-4 minutes counseling patient on  steps to stop use of tobacco products. I have provided patient with information on receiving free nicotine replacement  therapy, and contact numbers for hypnosis for smoking cessation as well as acupuncture for smoking cessation.   Past surgical hx, Family hx, Social hx all reviewed.  Current Outpatient Medications on File Prior to Visit  Medication Sig   acetaminophen  (TYLENOL ) 325 MG tablet Take 325 mg by mouth every 6 (six) hours as needed (for pain.).   albuterol  (VENTOLIN  HFA) 108 (90 Base) MCG/ACT inhaler TAKE 2 PUFFS BY MOUTH EVERY 6 HOURS AS NEEDED FOR WHEEZE OR SHORTNESS OF BREATH   atorvastatin  (LIPITOR) 40 MG tablet TAKE 1 TABLET BY MOUTH EVERY DAY   fluticasone  (FLONASE ) 50 MCG/ACT nasal spray Place 2 sprays into both nostrils daily.   Fluticasone -Umeclidin-Vilant (TRELEGY ELLIPTA ) 200-62.5-25 MCG/ACT AEPB Inhale 1 puff into the lungs daily. **discontinue previous rx**   isosorbide  mononitrate (IMDUR ) 30 MG 24 hr tablet TAKE 1 TABLET BY MOUTH AT BEDTIME.   latanoprost  (XALATAN ) 0.005 % ophthalmic solution Place 1 drop into both eyes at bedtime.   niacin  500 MG tablet Take 500 mg by mouth at bedtime.   nitroGLYCERIN  (NITROSTAT ) 0.4 MG SL tablet Place 1 tablet (0.4 mg total) under the tongue every 5 (five) minutes as needed for chest pain.   rivaroxaban  (XARELTO ) 20 MG TABS tablet Take 20 mg by mouth daily with supper.   vitamin C (ASCORBIC ACID ) 500 MG tablet Take 500 mg by mouth daily.   vitamin E  180 MG (400 UNITS) capsule Take 400 Units by mouth daily.   No current facility-administered medications on file prior to visit.     Allergies  Allergen Reactions   Rosuvastatin     Other Reaction(s): sluggish    Review Of Systems:  Constitutional:   +  weight loss, No night sweats,  Fevers, chills, fatigue, or  lassitude.  HEENT:   No headaches,  Difficulty swallowing,  Tooth/dental problems, or  Sore throat,                No sneezing, itching, ear ache, nasal congestion, post nasal drip,   CV:  No chest pain,  Orthopnea, PND, swelling in lower extremities, anasarca, dizziness, palpitations,  syncope.   GI  No heartburn, indigestion, abdominal pain, nausea, vomiting, diarrhea, change in bowel habits, loss of appetite, bloody stools.   Resp: + baseline shortness of breath with exertion or at rest.  + baseline  excess mucus, +  baseline  productive cough,  No non-productive cough,  No coughing up of blood.  No change in color of mucus.  No wheezing.  No chest wall deformity  Skin: no rash or lesions.  GU: no dysuria, change in color of urine, no urgency or frequency.  No flank pain, no hematuria   MS:  No joint pain or swelling.  No decreased range of motion.  No back pain.  Psych:  No change in mood or affect. No depression or anxiety.  No memory loss.   Vital Signs BP (!) 112/58   Pulse 71   Temp 98.3 F (36.8 C) (Oral)   Ht 6' (1.829 m)   Wt 154 lb 9.6 oz (70.1 kg)   SpO2 97%   BMI 20.97 kg/m    Physical Exam:  General- No distress,  A&Ox3, pleasant ENT: No sinus tenderness, TM clear, pale nasal mucosa, no oral exudate,no post nasal drip, no LAN Cardiac: S1, S2, regular rate and rhythm, no murmur Chest: No wheeze/ rales/ dullness; no accessory muscle use, no nasal flaring, no sternal retractions Abd.: Soft Non-tender, ND, BS +, Body mass index is 20.97 kg/m.  Ext: No clubbing cyanosis, edema. No obvious deformities Neuro:  normal strength, MAE x 4,  Skin: No rashes, warm and dry Psych: normal mood and behavior  Assessment & Plan Lung Nodules in a current every day smoker PET Avid Negative for malignancy on biopsy Post bronchoscopy with biopsies Plan We will do a 3 month short term follow up CT scan to ensure stability/ resolution of the lung nodule. You will get a call to schedule this closer to the time. You will follow up with me in the office 1-2 weeks after the scan to review results.  Call sooner for any unexplained weight loss or blood in your sputum.  Continue Trelegy as you have been doing. Rinse mouth after use. Albuterol  as needed for  breakthrough shortness of breath or wheezing. Please work on quitting smoking. You can receive free nicotine replacement therapy (patches, gum, or mints) by calling 1-800-QUIT NOW. Please call so we can get you on the path to becoming a non-smoker. I know it is hard, but you can do this!  Hypnosis for smoking cessation  [employer] [phone]  Acupuncture for smoking cessation  [facility] [phone]   Call if you need us  sooner. Please contact office for sooner follow up if symptoms do not improve or worsen or seek emergency care      I spent 25 minutes dedicated to the care of this patient on the date of this encounter to include pre-visit review of records, face-to-face time with the patient discussing conditions above, post visit ordering of testing, clinical documentation with the electronic health record, making appropriate referrals as documented, and communicating necessary information to the patient's healthcare team.    Lauraine JULIANNA Lites, NP 11/03/2023  1:28 PM

## 2023-11-07 NOTE — Progress Notes (Signed)
 I agree with the plans as outlined above.   Lamar Chris, MD, PhD 11/07/2023, 6:51 PM [facility] [phone] or if no answer before 7:00PM call [phone] For any issues after 7:00PM please call eLink [phone]

## 2023-11-10 ENCOUNTER — Ambulatory Visit: Admitting: Acute Care

## 2023-11-24 NOTE — Progress Notes (Unsigned)
 Cardiology Office Note:   Date:  11/25/2023  ID:  Adam Gilmore, DOB Jul 07, 1950, MRN [id number] PCP:  Auston Opal, DO  [facility] Providers Cardiologist:  Wendel Haws, MD Referring MD: Auston Opal, DO  Chief Complaint/Reason for Referral: Follow-up coronary artery disease ASSESSMENT:    1. Precordial pain   2. Coronary artery disease involving native coronary artery of native heart without angina pectoris   3. Paroxysmal atrial fibrillation (HCC)   4. Secondary hypercoagulable state (HCC)   5. Peripheral arterial disease (HCC)   6. Bilateral carotid artery stenosis   7. Hyperlipidemia LDL goal <55   8. Aortic atherosclerosis (HCC)   9. Elevated lipoprotein(a)   10. Diastolic dysfunction   11. Recurrent acute deep vein thrombosis (DVT) of left lower extremity (HCC)   12. Dyspnea on exertion     PLAN:   In order of problems listed above: Precordial pain: No routine or regular angina.  The patient has occasional chest discomfort when sitting.  Will monitor for now. CAD: Continue Xarelto  20 daily, atorvastatin  40 daily, Imdur  30 daily Imdur  PAF: Continue Xarelto  20 daily Secondary hypercoagulable state: Continue Xarelto  20 daily Peripheral arterial disease: Continue medical therapy with Xarelto  20 daily, atorvastatin  40 daily; if develops claudication symptoms consider Pletal; exercise program. Carotid disease: Mild on last ultrasound in 2024; continue atorvastatin  40 mg daily, Xarelto  20 daily Hyperlipidemia: Continue atorvastatin  40 mg; check lipid panel and LFTs today if above LDL above 55 will refer to Pharm.D. for further recommendations. Aortic atherosclerosis: Continue Xarelto  20 daily, atorvastatin  40 Elevated LP(a): See #7 above Diastolic dysfunction: Consider ARB and SGLT2 inhibitor in the future Recurrent DVT: Continue Xarelto  20 daily indefinitely Dyspnea: Due to significant pulmonary disease.            Dispo:  Return in about 1 year (around 11/24/2024).        I spent 38 minutes reviewing all clinical data during and prior to this visit including all relevant imaging studies, laboratories, clinical information from other health systems and prior notes from both Cardiology and other specialties, interviewing the patient, conducting a complete physical examination, and coordinating care in order to formulate a comprehensive and personalized evaluation and treatment plan.   History of Present Illness:    FOCUSED PROBLEM LIST:   Coronary artery disease  Myoview  10/10/22: inf infarct w peri-infarct ischemia, EF 52; int risk  LHC 10/17/2022: mRCA diffuse 50, mLM 60 (RFR 1.0) >> Med Rx TTE 10/23/22: EF 55-60, no RWMA, mild LVH, Gr 1 DD, NL RVSF, mod AV Ca2+, AV sclerosis, RAP 3  Paroxysmal atrial fibrillation  Monitor 09/2022: SVT x 1 (6 beats); PACs 2.4%; PVCs 3.2%; no AF Peripheral arterial disease  US  06/14/21: no AAA ABIs 10/09/22: R 0.67; L 1.08 // US  R dist SFA 75-99; L mid SFA 30-49 PV eval (Dr. Court) 09/2022 >> no claudication - conservative Rx unless +claudication  Carotid artery disease Vascuscreen 10/09/22: mod RICA plaque, mild LICA plaque Hyperlipidemia  Lp(a) 08/2021: 118.8 Recurrent DVT  On indefinite anticoagulation with Xarelto  Prostate CA  CAD Intermediate to high risk findings Lexiscan  2024 60% LM (>> IVUS greater than 6, RFR 1.0) and long diffusely diseased RCA >> med Rx cath 2024 Diastolic dysfunction G1 DD, mild LVH, no significant valve issues, EF 55 to 60% TTE 2024 Aortic atherosclerosis Chest CT 2024 COPD/emphysema With ongoing tobacco abuse 1 AVB +  iRBBB  March 2023: Patient seen for initial consultation regarding incidentally noted coronary artery calcification.  He was without  chest pain and so no further changes were made to his medical regimen.  He was referred for abdominal aortic aneurysm screening ultrasound which was unremarkable.   June 2023: The patient remains angina free.  Is able to walk without  claudication symptoms.  His biggest issue is a continual cough.  He does not wheeze.  He is required no emergency room visits or hospitalizations recently for breathing issues or chest pain.  He has had no severe bleeding or bruising while on anticoagulation.  He otherwise feels well and is looking forward to going to the beach this summer.  Plan: Continue medical therapy; check lipids, LP(a), and LFTs.   July 2024: In the interim his LDL was found to be 72 with an elevated LP(a).  He was seen by Dr. Mona and his atorvastatin  dose was increased.  He tells me he has been more short of breath.  He does have severe emphysema.  He denies however any chest pain.  He is wondering whether he has any blockages that would be contributing to his shortness of breath.  He denies any presyncope or syncope.  He is required no hospitalizations or emergency room visits.  He unfortunately continues to smoke.  He does develop bilateral leg pain sometimes when he walks.  He has had no signs or symptoms of stroke.  He he denies any palpitations.  Plan: Increase atorvastatin  to 40 mg and check lipids and LFTs in 2 months; obtain screening carotids given tobacco abuse; obtain ABIs given claudication symptoms.  Obtain stress test and echocardiogram due to dyspnea.  September 2025:  Patient consents to use of AI scribe. Given intermediate to high risk findings on nuclear stress test, he underwent coronary angiography which demonstrated moderate disease of the left main which was interrogated by IVUS and RFR with reassuring findings.  His RCA was diffusely diseased.  The RCA would require long segment stenting and was not thought to contribute to his dyspnea and therefore medical therapy was pursued.  His echocardiogram demonstrated grade 1 diastolic dysfunction with preserved ejection fraction.  He was last evaluated by cardiology in December 2024.  At that point in time he was reporting muscle soreness in regards to his chest  discomfort symptoms.  It was thought to represent a musculoskeletal etiology.  He underwent bronchoscopy due to left lower lobe lung nodule and the biopsy was negative for malignancy.  He experiences significant shortness of breath that limits his ability to perform activities without running out of energy or air. His oxygen saturation remains high during these episodes, as measured by a personal oxygen tester. He uses canned oxygen, which provides more relief than his inhaler, albuterol . No wheezing is noted, and he only coughs when there is mucus buildup. He occasionally experiences chest tightness, described as a feeling of something being stuck, but not pain, and it occurs mostly when sitting.  No significant chest pain is reported, and he denies any significant bleeding while on Xarelto .      Current Medications: Current Meds  Medication Sig   acetaminophen  (TYLENOL ) 325 MG tablet Take 325 mg by mouth every 6 (six) hours as needed (for pain.).   albuterol  (VENTOLIN  HFA) 108 (90 Base) MCG/ACT inhaler TAKE 2 PUFFS BY MOUTH EVERY 6 HOURS AS NEEDED FOR WHEEZE OR SHORTNESS OF BREATH   atorvastatin  (LIPITOR) 40 MG tablet TAKE 1 TABLET BY MOUTH EVERY DAY   fluticasone  (FLONASE ) 50 MCG/ACT nasal spray Place 2 sprays into both nostrils daily.   Fluticasone -Umeclidin-Vilant (TRELEGY  ELLIPTA) 200-62.5-25 MCG/ACT AEPB Inhale 1 puff into the lungs daily. **discontinue previous rx**   isosorbide  mononitrate (IMDUR ) 30 MG 24 hr tablet TAKE 1 TABLET BY MOUTH AT BEDTIME.   latanoprost  (XALATAN ) 0.005 % ophthalmic solution Place 1 drop into both eyes at bedtime.   niacin  500 MG tablet Take 500 mg by mouth at bedtime.   nitroGLYCERIN  (NITROSTAT ) 0.4 MG SL tablet Place 1 tablet (0.4 mg total) under the tongue every 5 (five) minutes as needed for chest pain.   rivaroxaban  (XARELTO ) 20 MG TABS tablet Take 20 mg by mouth daily with supper.   vitamin C (ASCORBIC ACID ) 500 MG tablet Take 500 mg by mouth daily.    vitamin E  180 MG (400 UNITS) capsule Take 400 Units by mouth daily.     Review of Systems:   Please see the history of present illness.    All other systems reviewed and are negative.     EKGs/Labs/Other Test Reviewed:   EKG: 2025 sinus rhythm with first-degree AV block, incomplete right bundle branch block  EKG Interpretation Date/Time:    Ventricular Rate:    PR Interval:    QRS Duration:    QT Interval:    QTC Calculation:   R Axis:      Text Interpretation:          CARDIAC STUDIES: Refer to CV Procedures and Imaging Tabs   Risk Assessment/Calculations:    CHA2DS2-VASc Score = 1   This indicates a 0.6% annual risk of stroke. The patient's score is based upon: CHF History: 0 HTN History: 0 Diabetes History: 0 Stroke History: 0 Vascular Disease History: 0 Age Score: 1 Gender Score: 0          Physical Exam:   VS:  BP 116/77 (BP Location: Left Arm, Patient Position: Sitting, Cuff Size: Normal)   Pulse 78   Resp 16   Ht 6' (1.829 m)   Wt 153 lb 9.6 oz (69.7 kg)   SpO2 94%   BMI 20.83 kg/m        Wt Readings from Last 3 Encounters:  11/25/23 153 lb 9.6 oz (69.7 kg)  11/03/23 154 lb 9.6 oz (70.1 kg)  10/28/23 154 lb 1.6 oz (69.9 kg)      GENERAL:  No apparent distress, AOx3 HEENT:  No carotid bruits, +2 carotid impulses, no scleral icterus CAR: RRR no murmurs, gallops, rubs, or thrills RES: Poor air movement ABD:  Soft, nontender, nondistended, positive bowel sounds x 4 VASC:  +2 radial pulses, +2 carotid pulses NEURO:  CN 2-12 grossly intact; motor and sensory grossly intact PSYCH:  No active depression or anxiety EXT:  No edema, ecchymosis, or cyanosis  Signed, Sergei Delo K Niurka Benecke, MD  11/25/2023 11:47 PM    [facility] [address] [place], [place], [place]  [address] Phone: [phone]; Fax: [phone]   Note:  This document was prepared using Dragon voice recognition software and may include unintentional dictation  errors.

## 2023-11-25 ENCOUNTER — Ambulatory Visit: Attending: Internal Medicine | Admitting: Internal Medicine

## 2023-11-25 ENCOUNTER — Encounter: Payer: Self-pay | Admitting: Internal Medicine

## 2023-11-25 VITALS — BP 116/77 | HR 78 | Resp 16 | Ht 72.0 in | Wt 153.6 lb

## 2023-11-25 DIAGNOSIS — R0609 Other forms of dyspnea: Secondary | ICD-10-CM

## 2023-11-25 DIAGNOSIS — I480 Paroxysmal atrial fibrillation: Secondary | ICD-10-CM

## 2023-11-25 DIAGNOSIS — I739 Peripheral vascular disease, unspecified: Secondary | ICD-10-CM

## 2023-11-25 DIAGNOSIS — I5189 Other ill-defined heart diseases: Secondary | ICD-10-CM

## 2023-11-25 DIAGNOSIS — E7841 Elevated Lipoprotein(a): Secondary | ICD-10-CM

## 2023-11-25 DIAGNOSIS — I6523 Occlusion and stenosis of bilateral carotid arteries: Secondary | ICD-10-CM

## 2023-11-25 DIAGNOSIS — R072 Precordial pain: Secondary | ICD-10-CM

## 2023-11-25 DIAGNOSIS — I2510 Atherosclerotic heart disease of native coronary artery without angina pectoris: Secondary | ICD-10-CM | POA: Diagnosis not present

## 2023-11-25 DIAGNOSIS — E785 Hyperlipidemia, unspecified: Secondary | ICD-10-CM

## 2023-11-25 DIAGNOSIS — I82402 Acute embolism and thrombosis of unspecified deep veins of left lower extremity: Secondary | ICD-10-CM

## 2023-11-25 DIAGNOSIS — D6869 Other thrombophilia: Secondary | ICD-10-CM

## 2023-11-25 DIAGNOSIS — I700 Atherosclerosis of aorta: Secondary | ICD-10-CM

## 2023-11-25 NOTE — Patient Instructions (Signed)
 Medication Instructions:  No medication changes were made at this visit. Continue current regimen.   *If you need a refill on your cardiac medications before your next appointment, please call your pharmacy*  Lab Work: To be completed tomorrow: lipid panel and LFT  If you have labs (blood work) drawn today and your tests are completely normal, you will receive your results only by: MyChart Message (if you have MyChart) OR A paper copy in the mail If you have any lab test that is abnormal or we need to change your treatment, we will call you to review the results.  Testing/Procedures: None ordered today.  Follow-Up: At [facility], you and your health needs are our priority.  As part of our continuing mission to provide you with exceptional heart care, our providers are all part of one team.  This team includes your primary Cardiologist (physician) and Advanced Practice Providers or APPs (Physician Assistants and Nurse Practitioners) who all work together to provide you with the care you need, when you need it.  Your next appointment:   1 year(s)  Provider:   Lurena Red, MD

## 2023-11-27 ENCOUNTER — Ambulatory Visit: Payer: Self-pay | Admitting: Internal Medicine

## 2023-11-27 LAB — LIPID PANEL
Chol/HDL Ratio: 2.5 ratio (ref 0.0–5.0)
Cholesterol, Total: 113 mg/dL (ref 100–199)
HDL: 45 mg/dL (ref >39)
LDL Chol Calc (NIH): 55 mg/dL (ref 0–99)
Triglycerides: 59 mg/dL (ref 0–149)
VLDL Cholesterol Cal: 13 mg/dL (ref 5–40)

## 2023-11-27 LAB — HEPATIC FUNCTION PANEL
ALT: 12 IU/L (ref 0–44)
AST: 13 IU/L (ref 0–40)
Albumin: 3.8 g/dL (ref 3.8–4.8)
Alkaline Phosphatase: 69 IU/L (ref 44–121)
Bilirubin Total: 0.3 mg/dL (ref 0.0–1.2)
Bilirubin, Direct: 0.13 mg/dL (ref 0.00–0.40)
Total Protein: 5.7 g/dL — ABNORMAL LOW (ref 6.0–8.5)

## 2023-12-09 ENCOUNTER — Encounter (HOSPITAL_COMMUNITY): Payer: Self-pay | Admitting: Emergency Medicine

## 2024-02-02 ENCOUNTER — Ambulatory Visit (HOSPITAL_COMMUNITY)
Admission: RE | Admit: 2024-02-02 | Discharge: 2024-02-02 | Disposition: A | Discharge status: Home / Self Care | Source: Ambulatory Visit | Attending: Acute Care | Admitting: Acute Care

## 2024-02-02 DIAGNOSIS — R911 Solitary pulmonary nodule: Secondary | ICD-10-CM | POA: Diagnosis present

## 2024-02-09 ENCOUNTER — Ambulatory Visit: Admitting: Internal Medicine

## 2024-02-18 ENCOUNTER — Ambulatory Visit: Admitting: Acute Care

## 2024-02-18 ENCOUNTER — Telehealth: Payer: Self-pay | Admitting: Acute Care

## 2024-02-18 ENCOUNTER — Encounter: Payer: Self-pay | Admitting: Acute Care

## 2024-02-18 ENCOUNTER — Encounter: Payer: Self-pay | Admitting: Emergency Medicine

## 2024-02-18 VITALS — BP 112/60 | HR 71 | Temp 97.5°F | Ht 72.0 in | Wt 150.0 lb

## 2024-02-18 DIAGNOSIS — R911 Solitary pulmonary nodule: Secondary | ICD-10-CM | POA: Diagnosis not present

## 2024-02-18 DIAGNOSIS — J449 Chronic obstructive pulmonary disease, unspecified: Secondary | ICD-10-CM | POA: Diagnosis not present

## 2024-02-18 DIAGNOSIS — F17210 Nicotine dependence, cigarettes, uncomplicated: Secondary | ICD-10-CM | POA: Diagnosis not present

## 2024-02-18 DIAGNOSIS — R9389 Abnormal findings on diagnostic imaging of other specified body structures: Secondary | ICD-10-CM

## 2024-02-18 DIAGNOSIS — R918 Other nonspecific abnormal finding of lung field: Secondary | ICD-10-CM | POA: Insufficient documentation

## 2024-02-18 DIAGNOSIS — F17200 Nicotine dependence, unspecified, uncomplicated: Secondary | ICD-10-CM

## 2024-02-18 NOTE — Progress Notes (Signed)
 History of Present Illness Adam Gilmore is a 73 y.o. male current every day smoker followed through the lung cancer screening program, referred for consult for abnormal lung cancer screening. He will be followed by Dr. Shelah.    02/18/2024 Discussed the use of AI scribe software for clinical note transcription with the patient, who gave verbal consent to proceed.  Synopsis 73 year old male current every day smoker ( 56 pack year smoking history) with a left lower lobe lung nodule , with PET scan in July 2025 showing abnormal uptake. Three additional small nodules exhibited low-level uptake, with the most intense being a 5 mm nodule in the left upper lobe. He has experienced significant weight loss, approximately 30 pounds over the last few years, with 10 pounds lost in the past six months.  Recommendation made to achieve a tissue diagnosis via robotic assisted navigational bronchoscopy.  He went for bronchoscopy with biopsies on 10/28/2023. Biopsy of the left lower lobe was negative for malignancy. Plan was for a 3 month follow up scan. He is here to review the results.  History of Present Illness Pt. Presents for follow up after 3 month follow up Ct Chest post bronchoscopy with negative biopsy 10/2023. He states he has been doing well.  We have reviewed the results of his 46-month follow-up CT chest.  There are multiple bilateral spiculated pulmonary nodules noted on this scan.  There is a 14 x 11 mm left lower lobe pulmonary nodule.  This had decreased in size since the prior exam where it measured 19 x 16 mm.  This nodule had demonstrated significant increase in metabolic activity on the prior PET scan, however remains highly concerning for neoplasm.  There is also a 5 x 4 mm left upper lobe pulmonary nodule similar in size to prior study with there is also increased metabolic activity on the PET scan. Imaging also revealed multiple new spiculated nodules that have developed in the interim since  the prior study.  These include a left upper lobe 14 x 6 mm nodule, a right lower lobe 14 x 8 mm nodule, a right upper lobe 14 x 9 mm nodule, a right lower lobe 16 x 14 mm nodule, and a left lower lobe 9 x 5 mm nodule.  Recommendation per radiology is for further evaluation with repeat PET scan and consideration of repeat tissue sampling.   I spoke with Dr. Shelah, we reviewed the imaging together.  He recommended repeat bronchoscopy with biopsies which has subsequently been scheduled for 03/02/2024.  We will also order pet imaging to help guide navigational biopsy. Patient is in agreement with this plan.  Patient was visibly upset in the room today when we reviewed the results of his scan.  The scan has personally been reviewed by both myself and Dr. Shelah and I have reviewed it with the patient today in the office.  PET scan scheduled for December 10.  With first available bronchoscopy with biopsies per Dr. Shelah is 03/02/2024.  Test Results: 02/02/2024 CT Chest   Lungs/Pleura: Severe bullous emphysema with chronic right apical pleural and parenchymal scarring. There is no acute airspace disease, effusion, or pneumothorax.   There are multiple bilateral spiculated pulmonary nodules. A subpleural 14 x 11 mm left lower lobe pulmonary nodule reference image 78/5 is decreased in size since prior exam where this had measured 19 x 16 mm. This demonstrated significant increased metabolic activity on the prior PET scan however and remains highly concerning for neoplasm.  There is also a 5 x 4 mm left upper lobe pulmonary nodule reference image 50/5, similar in size to prior study where there was also increased metabolic activity on PET scan.   Multiple new spiculated nodules have developed in the interim since the prior study, with index lesions as follows:   Left upper lobe, image 41/5, 14 x 16 mm.   Right lower lobe, image 75/5, 14 x 8 mm.   Right upper lobe, image 77/5, 14 x 9 mm.   Right  lower lobe, image 92/5, 16 x 14 mm.   Left lower lobe, image 107/5, 9 x 5 mm.   The central airways are patent.  PET scan 10/09/2023 Dominant spiculated nodule left lower lobe has significant abnormal uptake of maximum SUV of 5.3 and is worrisome for neoplasm. Recommend further evaluation.   There 3 other small nodules elsewhere in the lungs which have developed over time and have low-level uptake. The most intense is in the left upper lung measuring maximum SUV of 2.0 and measures 5 mm in size. This would be suspicious for size based on this level of uptake. Other lesions could be followed.   Low-dose CT 09/10/2023 Lung-RADS 4B, suspicious. Additional imaging evaluation or consultation with Pulmonology or Thoracic Surgery recommended. 2. 19.9 mm irregular spiculated nodular opacity in the left lower lobe confluent with and retracting the major fissure. Even in retrospect there was no lesion visible at this location on the study from 6 months ago. While the rapid interval appearance would be more in keeping with an infectious/inflammatory etiology, qualitative imaging features are highly suspicious for primary bronchogenic carcinoma. 3. Stable left adrenal adenoma. 4. Aortic Atherosclerosis (ICD10-I70.0) and Emphysema (ICD10-J43.9).       Latest Ref Rng & Units 10/28/2023   11:40 AM 10/15/2022    3:48 PM 04/07/2021    5:05 AM  CBC  WBC 4.0 - 10.5 K/uL 5.9  5.7  5.0   Hemoglobin 13.0 - 17.0 g/dL 85.5  84.6  85.3   Hematocrit 39.0 - 52.0 % 43.5  43.9  43.6   Platelets 150 - 400 K/uL 190  214  209        Latest Ref Rng & Units 10/28/2023   11:40 AM 10/15/2022    3:48 PM 04/07/2021    5:05 AM  BMP  Glucose 70 - 99 mg/dL 895  82  874   BUN 8 - 23 mg/dL 10  10  15    Creatinine 0.61 - 1.24 mg/dL 9.09  9.05  9.16   BUN/Creat Ratio 10 - 24  11    Sodium 135 - 145 mmol/L 140  147  138   Potassium 3.5 - 5.1 mmol/L 4.3  5.3  3.6   Chloride 98 - 111 mmol/L 103  103  100   CO2 22 - 32  mmol/L 28  29  31    Calcium  8.9 - 10.3 mg/dL 9.1  9.5  8.4     BNP    Component Value Date/Time   BNP 31.3 04/02/2021 1626    ProBNP No results found for: PROBNP  PFT    Component Value Date/Time   FEV1PRE 1.13 03/26/2022 1452   FEV1POST 1.24 03/26/2022 1452   FVCPRE 2.88 03/26/2022 1452   FVCPOST 3.06 03/26/2022 1452   TLC 11.07 03/26/2022 1452   DLCOUNC 9.21 03/26/2022 1452   PREFEV1FVCRT 39 03/26/2022 1452   PSTFEV1FVCRT 41 03/26/2022 1452    CT CHEST WO CONTRAST Result Date: 02/06/2024 CLINICAL DATA:  Lung nodule, > 8mm EXAM: CT CHEST WITHOUT CONTRAST TECHNIQUE: Multidetector CT imaging of the chest was performed following the standard protocol without IV contrast. RADIATION DOSE REDUCTION: This exam was performed according to the departmental dose-optimization program which includes automated exposure control, adjustment of the mA and/or kV according to patient size and/or use of iterative reconstruction technique. COMPARISON:  10/09/2023, 09/10/2023 FINDINGS: Cardiovascular: Unenhanced imaging of the heart is unremarkable without pericardial effusion. No evidence of thoracic aortic aneurysm. Stable atherosclerosis of the aorta and coronary vasculature. Assessment of the vascular lumen cannot be performed without intravenous contrast. Mediastinum/Nodes: Stable appearance of the thyroid, trachea, and esophagus. No pathologic adenopathy. Lungs/Pleura: Severe bullous emphysema with chronic right apical pleural and parenchymal scarring. There is no acute airspace disease, effusion, or pneumothorax. There are multiple bilateral spiculated pulmonary nodules. A subpleural 14 x 11 mm left lower lobe pulmonary nodule reference image 78/5 is decreased in size since prior exam where this had measured 19 x 16 mm. This demonstrated significant increased metabolic activity on the prior PET scan however and remains highly concerning for neoplasm. There is also a 5 x 4 mm left upper lobe pulmonary  nodule reference image 50/5, similar in size to prior study where there was also increased metabolic activity on PET scan. Multiple new spiculated nodules have developed in the interim since the prior study, with index lesions as follows: Left upper lobe, image 41/5, 14 x 16 mm. Right lower lobe, image 75/5, 14 x 8 mm. Right upper lobe, image 77/5, 14 x 9 mm. Right lower lobe, image 92/5, 16 x 14 mm. Left lower lobe, image 107/5, 9 x 5 mm. The central airways are patent. Upper Abdomen: Stable low-attenuation left adrenal thickening measuring up to 19 mm most consistent with adenoma. Given stability and lack of metabolic activity on prior PET scan no specific follow-up is recommended. Musculoskeletal: There are no acute or destructive bony abnormalities. Reconstructed images demonstrate no additional findings. IMPRESSION: 1. Multiple bilateral spiculated pulmonary nodules as above. There has been slight decrease in the subpleural left lower lobe nodule seen previously, with stable appearance of the prior left upper lobe pulmonary nodule. However, multiple new bilateral spiculated pulmonary nodules have developed with index lesions provided above. Findings are once again highly concerning for malignancy. Further evaluation with repeat PET scan or repeat bronchoscopy and tissue sampling is recommended. 2. Pulmonary emphysema is present. Pulmonary emphysema is an independent risk factor for lung cancer. Recommend patient be considered for lung cancer screening. [place]  [employer]. Screening for Lung Cancer: [place]  Licensed [employer]. JAMA. 2021;325(10):962-970. 3.  Aortic Atherosclerosis (ICD10-I70.0). These results will be called to the ordering clinician or representative by the Radiologist Assistant, and communication documented in the PACS or [employer]. Electronically Signed   By: Ozell Daring M.D.   On: 02/06/2024 20:32     Past medical hx Past Medical  History:  Diagnosis Date   Adenomatous colon polyp    Atrial fibrillation (HCC)    Centrilobular emphysema (HCC)    COPD (chronic obstructive pulmonary disease) (HCC)    Coronary artery disease    History of DVT of lower extremity    Hypercholesterolemia    Hypercholesterolemia    prostate ca dx'd 03/2012   surg only   Skin cancer    Tobacco dependence      Social History   Tobacco Use   Smoking status: Every Day    Current packs/day: 1.00    Average packs/day:  1 pack/day for 56.0 years (56.0 ttl pk-yrs)    Types: Cigarettes   Smokeless tobacco: Never   Tobacco comments:    1 pack a day 02/18/2024 KRD  Vaping Use   Vaping status: Never Used  Substance Use Topics   Alcohol use: Yes    Comment: socially   Drug use: No    Mr.Schrader reports that he has been smoking cigarettes. He has a 56 pack-year smoking history. He has never used smokeless tobacco. He reports current alcohol use. He reports that he does not use drugs.  Tobacco Cessation: Ready to quit: Not Answered Counseling given: Not Answered Tobacco comments: 1 pack a day 02/18/2024 KRD Currently smokes 1 pack/day with a 56-pack-year smoking history  Past surgical hx, Family hx, Social hx all reviewed.  Current Outpatient Medications on File Prior to Visit  Medication Sig   acetaminophen  (TYLENOL ) 325 MG tablet Take 325 mg by mouth every 6 (six) hours as needed (for pain.).   albuterol  (VENTOLIN  HFA) 108 (90 Base) MCG/ACT inhaler TAKE 2 PUFFS BY MOUTH EVERY 6 HOURS AS NEEDED FOR WHEEZE OR SHORTNESS OF BREATH   atorvastatin  (LIPITOR) 40 MG tablet TAKE 1 TABLET BY MOUTH EVERY DAY   Fluticasone -Umeclidin-Vilant (TRELEGY ELLIPTA ) 200-62.5-25 MCG/ACT AEPB Inhale 1 puff into the lungs daily. **discontinue previous rx**   isosorbide  mononitrate (IMDUR ) 30 MG 24 hr tablet TAKE 1 TABLET BY MOUTH AT BEDTIME.   latanoprost  (XALATAN ) 0.005 % ophthalmic solution Place 1 drop into both eyes at bedtime.   niacin  500 MG tablet  Take 500 mg by mouth at bedtime.   nitroGLYCERIN  (NITROSTAT ) 0.4 MG SL tablet Place 1 tablet (0.4 mg total) under the tongue every 5 (five) minutes as needed for chest pain.   rivaroxaban  (XARELTO ) 20 MG TABS tablet Take 20 mg by mouth daily with supper.   vitamin C (ASCORBIC ACID ) 500 MG tablet Take 500 mg by mouth daily.   vitamin E  180 MG (400 UNITS) capsule Take 400 Units by mouth daily.   No current facility-administered medications on file prior to visit.     Allergies  Allergen Reactions   Rosuvastatin     Other Reaction(s): sluggish    Review Of Systems:  Constitutional:   +  weight loss over the last 6 months, no night sweats,  Fevers, chills, fatigue, or  lassitude.  HEENT:   No headaches,  Difficulty swallowing,  Tooth/dental problems, or  Sore throat,                No sneezing, itching, ear ache, nasal congestion, post nasal drip,   CV:  No chest pain,  Orthopnea, PND, swelling in lower extremities, anasarca, dizziness, palpitations, syncope.   GI  No heartburn, indigestion, abdominal pain, nausea, vomiting, diarrhea, change in bowel habits, loss of appetite, bloody stools.   Resp: + shortness of breath with exertion less at rest.  + Baseline excess mucus, + baseline productive cough,  No non-productive cough,  No coughing up of blood.  No change in color of mucus.  + Occasional wheezing.  No chest wall deformity  Skin: no rash or lesions.  GU: no dysuria, change in color of urine, no urgency or frequency.  No flank pain, no hematuria   MS:  No joint pain or swelling.  No decreased range of motion.  No back pain.  Psych:  No change in mood or affect. No depression or anxiety.  No memory loss.   Vital Signs BP 112/60   Pulse 71  Temp (!) 97.5 F (36.4 C) (Oral)   Ht 6' (1.829 m)   Wt 150 lb (68 kg)   SpO2 96%   BMI 20.34 kg/m    Physical Exam:  General- No distress,  A&Ox3, pleasant and appropriate ENT: No sinus tenderness, TM clear, pale nasal mucosa,  no oral exudate,no post nasal drip, no LAN Cardiac: S1, S2, regular rate and rhythm, no murmur Chest: No wheeze/ rales/ dullness; no accessory muscle use, no nasal flaring, no sternal retractions Abd.: Soft Non-tender, nondistended, bowel sounds positive,Body mass index is 20.34 kg/m.  Ext: No clubbing cyanosis, edema, no obvious deformities Neuro:  normal strength, moving all extremities x 4, alert and oriented x 3, appropriate Skin: No rashes, warm and dry, no obvious skin lesions Psych: normal mood and behavior  Physical Exam    Assessment/Plan Follow-up CT imaging with multiple new spiculated nodules, concerning for malignancy Previous biopsy August 2025 left lower lobe without malignancy identified on pathology Current everyday smoker Plan We have reviewed your follow up Ct Chest.  There are several new spiculated nodules on the recent scan. They are concerning. I have ordered a PET scan. You will get a call to get this scheduled. I will keep an eye out for your scan results. Once they are back we will call you with results. If the PET scan shows the nodules are hypermetabolic, we will plan on a repeat Bronchoscopy with biopsies. Call us  sooner if you need us . Please contact office for sooner follow up if symptoms do not improve or worsen or seek emergency care   You can receive free nicotine replacement therapy (patches, gum, or mints) by calling 1-800-QUIT NOW. Please call so we can get you on the path to becoming a non-smoker. I know it is hard, but you can do this!  Hypnosis for smoking cessation  [employer] [phone]  Acupuncture for smoking cessation  [employer] [phone]    I spent 35 minutes dedicated to the care of this patient on the date of this encounter to include pre-visit review of records, face-to-face time with the patient discussing conditions above, post visit ordering of testing, clinical documentation with the electronic  health record, making appropriate referrals as documented, and communicating necessary information to the patient's healthcare team.   Assessment & Plan   Lauraine JULIANNA Lites, NP 02/18/2024  6:39 PM

## 2024-02-18 NOTE — Telephone Encounter (Signed)
 Case # [id number] Letter has been mailed to the patient also has mychart and left detailed message for the patient will send to [facility] to get auth

## 2024-02-18 NOTE — Telephone Encounter (Signed)
 Please schedule the following:  Provider performing procedure: BYrum Diagnosis:  Lung nodules  Which side if for nodule / mass?  Left upper and lower lobe Procedure:  Navigational bronch with biopsies  Has patient been spoken to by Provider and given informed consent?  Yes, Lauraine Lites NP on 02/18/2024 Anesthesia:  General Do you need Fluro?  Yes Duration of procedure:  1.5 hours Date: 03/02/2024 Alternate Date: This date is wide open  Time: Would like the third case of the day, with the 9 am arrival time Location:  [facility] Does patient have OSA?  No DM? No Or Latex allergy? No Medication Restriction/ Anticoagulate/Antiplatelet: Will need to hold Xarelto   x 48 hours ( Last dose 02/28/2024)  Pre-op Labs Ordered:determined by Anesthesia Imaging request: CT Chest done  02/06/2024 (If, SuperDimension CT Chest, please have STAT courier sent to ENDO)

## 2024-02-18 NOTE — H&P (View-Only) (Signed)
 History of Present Illness Adam Gilmore is a 73 y.o. male current every day smoker followed through the lung cancer screening program, referred for consult for abnormal lung cancer screening. He will be followed by Dr. Shelah.    02/18/2024 Discussed the use of AI scribe software for clinical note transcription with the patient, who gave verbal consent to proceed.  Synopsis 73 year old male current every day smoker ( 56 pack year smoking history) with a left lower lobe lung nodule , with PET scan in July 2025 showing abnormal uptake. Three additional small nodules exhibited low-level uptake, with the most intense being a 5 mm nodule in the left upper lobe. He has experienced significant weight loss, approximately 30 pounds over the last few years, with 10 pounds lost in the past six months.  Recommendation made to achieve a tissue diagnosis via robotic assisted navigational bronchoscopy.  He went for bronchoscopy with biopsies on 10/28/2023. Biopsy of the left lower lobe was negative for malignancy. Plan was for a 3 month follow up scan. He is here to review the results.  History of Present Illness Pt. Presents for follow up after 3 month follow up Ct Chest post bronchoscopy with negative biopsy 10/2023. He states he has been doing well.  We have reviewed the results of his 46-month follow-up CT chest.  There are multiple bilateral spiculated pulmonary nodules noted on this scan.  There is a 14 x 11 mm left lower lobe pulmonary nodule.  This had decreased in size since the prior exam where it measured 19 x 16 mm.  This nodule had demonstrated significant increase in metabolic activity on the prior PET scan, however remains highly concerning for neoplasm.  There is also a 5 x 4 mm left upper lobe pulmonary nodule similar in size to prior study with there is also increased metabolic activity on the PET scan. Imaging also revealed multiple new spiculated nodules that have developed in the interim since  the prior study.  These include a left upper lobe 14 x 6 mm nodule, a right lower lobe 14 x 8 mm nodule, a right upper lobe 14 x 9 mm nodule, a right lower lobe 16 x 14 mm nodule, and a left lower lobe 9 x 5 mm nodule.  Recommendation per radiology is for further evaluation with repeat PET scan and consideration of repeat tissue sampling.   I spoke with Dr. Shelah, we reviewed the imaging together.  He recommended repeat bronchoscopy with biopsies which has subsequently been scheduled for 03/02/2024.  We will also order pet imaging to help guide navigational biopsy. Patient is in agreement with this plan.  Patient was visibly upset in the room today when we reviewed the results of his scan.  The scan has personally been reviewed by both myself and Dr. Shelah and I have reviewed it with the patient today in the office.  PET scan scheduled for December 10.  With first available bronchoscopy with biopsies per Dr. Shelah is 03/02/2024.  Test Results: 02/02/2024 CT Chest   Lungs/Pleura: Severe bullous emphysema with chronic right apical pleural and parenchymal scarring. There is no acute airspace disease, effusion, or pneumothorax.   There are multiple bilateral spiculated pulmonary nodules. A subpleural 14 x 11 mm left lower lobe pulmonary nodule reference image 78/5 is decreased in size since prior exam where this had measured 19 x 16 mm. This demonstrated significant increased metabolic activity on the prior PET scan however and remains highly concerning for neoplasm.  There is also a 5 x 4 mm left upper lobe pulmonary nodule reference image 50/5, similar in size to prior study where there was also increased metabolic activity on PET scan.   Multiple new spiculated nodules have developed in the interim since the prior study, with index lesions as follows:   Left upper lobe, image 41/5, 14 x 16 mm.   Right lower lobe, image 75/5, 14 x 8 mm.   Right upper lobe, image 77/5, 14 x 9 mm.   Right  lower lobe, image 92/5, 16 x 14 mm.   Left lower lobe, image 107/5, 9 x 5 mm.   The central airways are patent.  PET scan 10/09/2023 Dominant spiculated nodule left lower lobe has significant abnormal uptake of maximum SUV of 5.3 and is worrisome for neoplasm. Recommend further evaluation.   There 3 other small nodules elsewhere in the lungs which have developed over time and have low-level uptake. The most intense is in the left upper lung measuring maximum SUV of 2.0 and measures 5 mm in size. This would be suspicious for size based on this level of uptake. Other lesions could be followed.   Low-dose CT 09/10/2023 Lung-RADS 4B, suspicious. Additional imaging evaluation or consultation with Pulmonology or Thoracic Surgery recommended. 2. 19.9 mm irregular spiculated nodular opacity in the left lower lobe confluent with and retracting the major fissure. Even in retrospect there was no lesion visible at this location on the study from 6 months ago. While the rapid interval appearance would be more in keeping with an infectious/inflammatory etiology, qualitative imaging features are highly suspicious for primary bronchogenic carcinoma. 3. Stable left adrenal adenoma. 4. Aortic Atherosclerosis (ICD10-I70.0) and Emphysema (ICD10-J43.9).       Latest Ref Rng & Units 10/28/2023   11:40 AM 10/15/2022    3:48 PM 04/07/2021    5:05 AM  CBC  WBC 4.0 - 10.5 K/uL 5.9  5.7  5.0   Hemoglobin 13.0 - 17.0 g/dL 85.5  84.6  85.3   Hematocrit 39.0 - 52.0 % 43.5  43.9  43.6   Platelets 150 - 400 K/uL 190  214  209        Latest Ref Rng & Units 10/28/2023   11:40 AM 10/15/2022    3:48 PM 04/07/2021    5:05 AM  BMP  Glucose 70 - 99 mg/dL 895  82  874   BUN 8 - 23 mg/dL 10  10  15    Creatinine 0.61 - 1.24 mg/dL 9.09  9.05  9.16   BUN/Creat Ratio 10 - 24  11    Sodium 135 - 145 mmol/L 140  147  138   Potassium 3.5 - 5.1 mmol/L 4.3  5.3  3.6   Chloride 98 - 111 mmol/L 103  103  100   CO2 22 - 32  mmol/L 28  29  31    Calcium  8.9 - 10.3 mg/dL 9.1  9.5  8.4     BNP    Component Value Date/Time   BNP 31.3 04/02/2021 1626    ProBNP No results found for: PROBNP  PFT    Component Value Date/Time   FEV1PRE 1.13 03/26/2022 1452   FEV1POST 1.24 03/26/2022 1452   FVCPRE 2.88 03/26/2022 1452   FVCPOST 3.06 03/26/2022 1452   TLC 11.07 03/26/2022 1452   DLCOUNC 9.21 03/26/2022 1452   PREFEV1FVCRT 39 03/26/2022 1452   PSTFEV1FVCRT 41 03/26/2022 1452    CT CHEST WO CONTRAST Result Date: 02/06/2024 CLINICAL DATA:  Lung nodule, > 8mm EXAM: CT CHEST WITHOUT CONTRAST TECHNIQUE: Multidetector CT imaging of the chest was performed following the standard protocol without IV contrast. RADIATION DOSE REDUCTION: This exam was performed according to the departmental dose-optimization program which includes automated exposure control, adjustment of the mA and/or kV according to patient size and/or use of iterative reconstruction technique. COMPARISON:  10/09/2023, 09/10/2023 FINDINGS: Cardiovascular: Unenhanced imaging of the heart is unremarkable without pericardial effusion. No evidence of thoracic aortic aneurysm. Stable atherosclerosis of the aorta and coronary vasculature. Assessment of the vascular lumen cannot be performed without intravenous contrast. Mediastinum/Nodes: Stable appearance of the thyroid, trachea, and esophagus. No pathologic adenopathy. Lungs/Pleura: Severe bullous emphysema with chronic right apical pleural and parenchymal scarring. There is no acute airspace disease, effusion, or pneumothorax. There are multiple bilateral spiculated pulmonary nodules. A subpleural 14 x 11 mm left lower lobe pulmonary nodule reference image 78/5 is decreased in size since prior exam where this had measured 19 x 16 mm. This demonstrated significant increased metabolic activity on the prior PET scan however and remains highly concerning for neoplasm. There is also a 5 x 4 mm left upper lobe pulmonary  nodule reference image 50/5, similar in size to prior study where there was also increased metabolic activity on PET scan. Multiple new spiculated nodules have developed in the interim since the prior study, with index lesions as follows: Left upper lobe, image 41/5, 14 x 16 mm. Right lower lobe, image 75/5, 14 x 8 mm. Right upper lobe, image 77/5, 14 x 9 mm. Right lower lobe, image 92/5, 16 x 14 mm. Left lower lobe, image 107/5, 9 x 5 mm. The central airways are patent. Upper Abdomen: Stable low-attenuation left adrenal thickening measuring up to 19 mm most consistent with adenoma. Given stability and lack of metabolic activity on prior PET scan no specific follow-up is recommended. Musculoskeletal: There are no acute or destructive bony abnormalities. Reconstructed images demonstrate no additional findings. IMPRESSION: 1. Multiple bilateral spiculated pulmonary nodules as above. There has been slight decrease in the subpleural left lower lobe nodule seen previously, with stable appearance of the prior left upper lobe pulmonary nodule. However, multiple new bilateral spiculated pulmonary nodules have developed with index lesions provided above. Findings are once again highly concerning for malignancy. Further evaluation with repeat PET scan or repeat bronchoscopy and tissue sampling is recommended. 2. Pulmonary emphysema is present. Pulmonary emphysema is an independent risk factor for lung cancer. Recommend patient be considered for lung cancer screening. [place]  [employer]. Screening for Lung Cancer: [place]  [employer]. JAMA. 2021;325(10):962-970. 3.  Aortic Atherosclerosis (ICD10-I70.0). These results will be called to the ordering clinician or representative by the Radiologist Assistant, and communication documented in the PACS or [employer]. Electronically Signed   By: Ozell Daring M.D.   On: 02/06/2024 20:32     Past medical hx Past Medical  History:  Diagnosis Date   Adenomatous colon polyp    Atrial fibrillation (HCC)    Centrilobular emphysema (HCC)    COPD (chronic obstructive pulmonary disease) (HCC)    Coronary artery disease    History of DVT of lower extremity    Hypercholesterolemia    Hypercholesterolemia    prostate ca dx'd 03/2012   surg only   Skin cancer    Tobacco dependence      Social History   Tobacco Use   Smoking status: Every Day    Current packs/day: 1.00    Average packs/day:  1 pack/day for 56.0 years (56.0 ttl pk-yrs)    Types: Cigarettes   Smokeless tobacco: Never   Tobacco comments:    1 pack a day 02/18/2024 KRD  Vaping Use   Vaping status: Never Used  Substance Use Topics   Alcohol use: Yes    Comment: socially   Drug use: No    Mr.Schrader reports that he has been smoking cigarettes. He has a 56 pack-year smoking history. He has never used smokeless tobacco. He reports current alcohol use. He reports that he does not use drugs.  Tobacco Cessation: Ready to quit: Not Answered Counseling given: Not Answered Tobacco comments: 1 pack a day 02/18/2024 KRD Currently smokes 1 pack/day with a 56-pack-year smoking history  Past surgical hx, Family hx, Social hx all reviewed.  Current Outpatient Medications on File Prior to Visit  Medication Sig   acetaminophen  (TYLENOL ) 325 MG tablet Take 325 mg by mouth every 6 (six) hours as needed (for pain.).   albuterol  (VENTOLIN  HFA) 108 (90 Base) MCG/ACT inhaler TAKE 2 PUFFS BY MOUTH EVERY 6 HOURS AS NEEDED FOR WHEEZE OR SHORTNESS OF BREATH   atorvastatin  (LIPITOR) 40 MG tablet TAKE 1 TABLET BY MOUTH EVERY DAY   Fluticasone -Umeclidin-Vilant (TRELEGY ELLIPTA ) 200-62.5-25 MCG/ACT AEPB Inhale 1 puff into the lungs daily. **discontinue previous rx**   isosorbide  mononitrate (IMDUR ) 30 MG 24 hr tablet TAKE 1 TABLET BY MOUTH AT BEDTIME.   latanoprost  (XALATAN ) 0.005 % ophthalmic solution Place 1 drop into both eyes at bedtime.   niacin  500 MG tablet  Take 500 mg by mouth at bedtime.   nitroGLYCERIN  (NITROSTAT ) 0.4 MG SL tablet Place 1 tablet (0.4 mg total) under the tongue every 5 (five) minutes as needed for chest pain.   rivaroxaban  (XARELTO ) 20 MG TABS tablet Take 20 mg by mouth daily with supper.   vitamin C (ASCORBIC ACID ) 500 MG tablet Take 500 mg by mouth daily.   vitamin E  180 MG (400 UNITS) capsule Take 400 Units by mouth daily.   No current facility-administered medications on file prior to visit.     Allergies  Allergen Reactions   Rosuvastatin     Other Reaction(s): sluggish    Review Of Systems:  Constitutional:   +  weight loss over the last 6 months, no night sweats,  Fevers, chills, fatigue, or  lassitude.  HEENT:   No headaches,  Difficulty swallowing,  Tooth/dental problems, or  Sore throat,                No sneezing, itching, ear ache, nasal congestion, post nasal drip,   CV:  No chest pain,  Orthopnea, PND, swelling in lower extremities, anasarca, dizziness, palpitations, syncope.   GI  No heartburn, indigestion, abdominal pain, nausea, vomiting, diarrhea, change in bowel habits, loss of appetite, bloody stools.   Resp: + shortness of breath with exertion less at rest.  + Baseline excess mucus, + baseline productive cough,  No non-productive cough,  No coughing up of blood.  No change in color of mucus.  + Occasional wheezing.  No chest wall deformity  Skin: no rash or lesions.  GU: no dysuria, change in color of urine, no urgency or frequency.  No flank pain, no hematuria   MS:  No joint pain or swelling.  No decreased range of motion.  No back pain.  Psych:  No change in mood or affect. No depression or anxiety.  No memory loss.   Vital Signs BP 112/60   Pulse 71  Temp (!) 97.5 F (36.4 C) (Oral)   Ht 6' (1.829 m)   Wt 150 lb (68 kg)   SpO2 96%   BMI 20.34 kg/m    Physical Exam:  General- No distress,  A&Ox3, pleasant and appropriate ENT: No sinus tenderness, TM clear, pale nasal mucosa,  no oral exudate,no post nasal drip, no LAN Cardiac: S1, S2, regular rate and rhythm, no murmur Chest: No wheeze/ rales/ dullness; no accessory muscle use, no nasal flaring, no sternal retractions Abd.: Soft Non-tender, nondistended, bowel sounds positive,Body mass index is 20.34 kg/m.  Ext: No clubbing cyanosis, edema, no obvious deformities Neuro:  normal strength, moving all extremities x 4, alert and oriented x 3, appropriate Skin: No rashes, warm and dry, no obvious skin lesions Psych: normal mood and behavior  Physical Exam    Assessment/Plan Follow-up CT imaging with multiple new spiculated nodules, concerning for malignancy Previous biopsy August 2025 left lower lobe without malignancy identified on pathology Current everyday smoker Plan We have reviewed your follow up Ct Chest.  There are several new spiculated nodules on the recent scan. They are concerning. I have ordered a PET scan. You will get a call to get this scheduled. I will keep an eye out for your scan results. Once they are back we will call you with results. If the PET scan shows the nodules are hypermetabolic, we will plan on a repeat Bronchoscopy with biopsies. Call us  sooner if you need us . Please contact office for sooner follow up if symptoms do not improve or worsen or seek emergency care   You can receive free nicotine replacement therapy (patches, gum, or mints) by calling 1-800-QUIT NOW. Please call so we can get you on the path to becoming a non-smoker. I know it is hard, but you can do this!  Hypnosis for smoking cessation  [employer] [phone]  Acupuncture for smoking cessation  [employer] [phone]    I spent 35 minutes dedicated to the care of this patient on the date of this encounter to include pre-visit review of records, face-to-face time with the patient discussing conditions above, post visit ordering of testing, clinical documentation with the electronic  health record, making appropriate referrals as documented, and communicating necessary information to the patient's healthcare team.   Assessment & Plan   Lauraine JULIANNA Lites, NP 02/18/2024  6:39 PM

## 2024-02-18 NOTE — Patient Instructions (Addendum)
 It is good to see you today. We have reviewed your follow up Ct Chest.  There are several new spiculated nodules on the recent scan. They are concerning. I have ordered a PET scan. You will get a call to get this scheduled. I will keep an eye out for your scan results. Once they are back we will call you with results. If the PET scan shows the nodules are hypermetabolic, we will plan on a repeat Bronchoscopy with biopsies. Call us  sooner if you need us . Please contact office for sooner follow up if symptoms do not improve or worsen or seek emergency care   You can receive free nicotine replacement therapy (patches, gum, or mints) by calling 1-800-QUIT NOW. Please call so we can get you on the path to becoming a non-smoker. I know it is hard, but you can do this!  Hypnosis for smoking cessation  [employer] [phone]  Acupuncture for smoking cessation  [employer] [phone]

## 2024-02-24 ENCOUNTER — Inpatient Hospital Stay (HOSPITAL_COMMUNITY): Admission: RE | Admit: 2024-02-24 | Discharge: 2024-02-24 | Attending: Acute Care | Admitting: Acute Care

## 2024-02-24 ENCOUNTER — Other Ambulatory Visit (HOSPITAL_COMMUNITY)

## 2024-02-24 DIAGNOSIS — R918 Other nonspecific abnormal finding of lung field: Secondary | ICD-10-CM

## 2024-02-24 LAB — GLUCOSE, CAPILLARY: Glucose-Capillary: 109 mg/dL — ABNORMAL HIGH (ref 70–99)

## 2024-02-24 MED ORDER — FLUDEOXYGLUCOSE F - 18 (FDG) INJECTION
7.4400 | Freq: Once | INTRAVENOUS | Status: AC
  Administered 2024-02-24: 7.44 via INTRAVENOUS

## 2024-02-26 ENCOUNTER — Telehealth: Payer: Self-pay

## 2024-02-26 NOTE — Telephone Encounter (Signed)
 Called and got patient scan escalated for appointment to review

## 2024-02-27 ENCOUNTER — Encounter: Payer: Self-pay | Admitting: Acute Care

## 2024-02-27 ENCOUNTER — Ambulatory Visit: Admitting: Acute Care

## 2024-02-27 VITALS — BP 112/60 | HR 82 | Temp 97.9°F | Ht 72.0 in | Wt 152.6 lb

## 2024-02-27 DIAGNOSIS — J449 Chronic obstructive pulmonary disease, unspecified: Secondary | ICD-10-CM

## 2024-02-27 DIAGNOSIS — F17210 Nicotine dependence, cigarettes, uncomplicated: Secondary | ICD-10-CM

## 2024-02-27 DIAGNOSIS — J9621 Acute and chronic respiratory failure with hypoxia: Secondary | ICD-10-CM

## 2024-02-27 DIAGNOSIS — F17200 Nicotine dependence, unspecified, uncomplicated: Secondary | ICD-10-CM

## 2024-02-27 DIAGNOSIS — R918 Other nonspecific abnormal finding of lung field: Secondary | ICD-10-CM

## 2024-02-27 DIAGNOSIS — R0609 Other forms of dyspnea: Secondary | ICD-10-CM

## 2024-02-27 DIAGNOSIS — R911 Solitary pulmonary nodule: Secondary | ICD-10-CM

## 2024-02-27 DIAGNOSIS — R942 Abnormal results of pulmonary function studies: Secondary | ICD-10-CM

## 2024-02-27 DIAGNOSIS — R9389 Abnormal findings on diagnostic imaging of other specified body structures: Secondary | ICD-10-CM

## 2024-02-27 NOTE — Patient Instructions (Addendum)
 It is good to see you today. Your PET scan shows the nodules of concern do have a low uptake on PET. Plan is to move forward with the biopsies as is scheduled 12/16. Follow up with me 12/22 as is scheduled to review results. We have walked you today. You did qualify for oxygen with ambulation. We will place orders for oxygen and POC. Please use POC at 2 L with activity. You should get a call to have this delivered.  Monitor oxygen saturations, maintain them > 88% at all times.  I have also ordered an overnight oxygenation study to ensure you are not dropping your oxygen levels at night when you sleep.  You will get a call to get this scheduled.   Do not smoke while wearing oxygen. You can receive free nicotine replacement therapy (patches, gum, or mints) by calling 1-800-QUIT NOW. Please call so we can get you on the path to becoming a non-smoker. I know it is hard, but you can do this!  Hypnosis for smoking cessation  [employer] [phone]  Acupuncture for smoking cessation  [employer] [phone]   Please contact office for sooner follow up if symptoms do not improve or worsen or seek emergency care

## 2024-02-27 NOTE — Progress Notes (Unsigned)
 History of Present Illness Adam Gilmore is a 73 y.o. male male current every day smoker followed through the lung cancer screening program, referred for consult for abnormal lung cancer screening. He will be followed by Dr. Shelah.    Synopsis 73 year old male current every day smoker ( 56 pack year smoking history) with a left lower lobe lung nodule , with PET scan in July 2025 showing abnormal uptake. Three additional small nodules exhibited low-level uptake, with the most intense being a 5 mm nodule in the left upper lobe. He has experienced significant weight loss, approximately 30 pounds over the last few years, with 10 pounds lost in the past six months.  Recommendation made to achieve a tissue diagnosis via robotic assisted navigational bronchoscopy.  He went for bronchoscopy with biopsies on 10/28/2023. Biopsy of the left lower lobe was negative for malignancy. Plan was for a 3 month follow up scan. The 3 month follow up showed multiple new spiculated nodules that have developed in the interim since the prior study. Dr. Shelah reviewed the scan and recommended repeat bronchoscopy with biopsies which was  subsequently  scheduled for 03/02/2024. PET scan was ordered to help with navigation. Pt is here today to review results.  02/27/2024 Discussed the use of AI scribe software for clinical note transcription with the patient, who gave verbal consent to proceed.  History of Present Illness Pt. Presents for follow up after PET scan. He states he is doing well. PET scan showed an  Overall pattern  of  increase in the size of metabolic pulmonary nodules with several new pulmonary nodules. While this is an unusual presentation for metastatic disease, overall the pattern is worsening, and repeat bronch with biopsy is warranted.There was no metastatic adenopathy, or distant metastatic disease, which is reassuring .   Patients wife did ask about oxygen. Patient is very short of breath with  ambulation. Sats are 95% at rest. He did drop sats below 88% on ambulatory saturation walk.He needs 2 L [place] with ambulation. We will order oxygen and POC today. We will also order an O&O to see if he is desaturating with sleep. Pt. Did qualify for oxygen. Orders have been placed. He will wear his oxygen at 2 L [place] with activity.      Test Results: PET scan 02/24/2024 The previously biopsied nodule in the left lower lobe measures 12 mm compared to 14 mm with SUVmax of 2.7 compared to SUVmax of 5.3 (Image 78).   There are several additional hypermetabolic nodules which have increased in the interval. For example, a new nodule in the medial aspect of the left upper lobe on image 58 measures 13 mm with SUVmax equal to 3.1. Nodule in the medial aspect of the right upper lobe measures 5 mm unchanged from 5 mm with SUVmax equal to 2.5 increased from 1.3.   Ill defined lesion in the right upper lobe measuring 12 mm on the 7/9 measuring 3.0 cm, new from prior.   Nodule within the medial aspect of the right lower lobe measuring 9 mm on image 75 with SUVmax equal 1.9 is new for comparison exam. Similar new nodule on image 84 in the right lower lobe.   No hypermetabolic lymph nodes.    Overall pattern is an increase in the size of metabolic pulmonary nodules with several new pulmonary nodules. 2. Unusual presentation of metastatic disease. Potential waxing and waning inflammatory nodules. 3. Overall, the pattern is worsening, therefore repeat biopsy may be  warranted. 4. No evidence of metastatic adenopathy or distant metastatic disease.  02/02/2024 CT Chest   Lungs/Pleura: Severe bullous emphysema with chronic right apical pleural and parenchymal scarring. There is no acute airspace disease, effusion, or pneumothorax.   There are multiple bilateral spiculated pulmonary nodules. A subpleural 14 x 11 mm left lower lobe pulmonary nodule reference image 78/5 is decreased in size since prior  exam where this had measured 19 x 16 mm. This demonstrated significant increased metabolic activity on the prior PET scan however and remains highly concerning for neoplasm. There is also a 5 x 4 mm left upper lobe pulmonary nodule reference image 50/5, similar in size to prior study where there was also increased metabolic activity on PET scan.   Multiple new spiculated nodules have developed in the interim since the prior study, with index lesions as follows:   Left upper lobe, image 41/5, 14 x 16 mm.   Right lower lobe, image 75/5, 14 x 8 mm.   Right upper lobe, image 77/5, 14 x 9 mm.   Right lower lobe, image 92/5, 16 x 14 mm.   Left lower lobe, image 107/5, 9 x 5 mm.   The central airways are patent.   PET scan 10/09/2023 Dominant spiculated nodule left lower lobe has significant abnormal uptake of maximum SUV of 5.3 and is worrisome for neoplasm. Recommend further evaluation.   There 3 other small nodules elsewhere in the lungs which have developed over time and have low-level uptake. The most intense is in the left upper lung measuring maximum SUV of 2.0 and measures 5 mm in size. This would be suspicious for size based on this level of uptake. Other lesions could be followed.   Low-dose CT 09/10/2023 Lung-RADS 4B, suspicious. Additional imaging evaluation or consultation with Pulmonology or Thoracic Surgery recommended. 2. 19.9 mm irregular spiculated nodular opacity in the left lower lobe confluent with and retracting the major fissure. Even in retrospect there was no lesion visible at this location on the study from 6 months ago. While the rapid interval appearance would be more in keeping with an infectious/inflammatory etiology, qualitative imaging features are highly suspicious for primary bronchogenic carcinoma. 3. Stable left adrenal adenoma. 4. Aortic Atherosclerosis (ICD10-I70.0) and Emphysema (ICD10-J43.9).    Latest Ref Rng & Units 10/28/2023   11:40 AM  10/15/2022    3:48 PM 04/07/2021    5:05 AM  CBC  WBC 4.0 - 10.5 K/uL 5.9  5.7  5.0   Hemoglobin 13.0 - 17.0 g/dL 85.5  84.6  85.3   Hematocrit 39.0 - 52.0 % 43.5  43.9  43.6   Platelets 150 - 400 K/uL 190  214  209        Latest Ref Rng & Units 10/28/2023   11:40 AM 10/15/2022    3:48 PM 04/07/2021    5:05 AM  BMP  Glucose 70 - 99 mg/dL 895  82  874   BUN 8 - 23 mg/dL 10  10  15    Creatinine 0.61 - 1.24 mg/dL 9.09  9.05  9.16   BUN/Creat Ratio 10 - 24  11    Sodium 135 - 145 mmol/L 140  147  138   Potassium 3.5 - 5.1 mmol/L 4.3  5.3  3.6   Chloride 98 - 111 mmol/L 103  103  100   CO2 22 - 32 mmol/L 28  29  31    Calcium  8.9 - 10.3 mg/dL 9.1  9.5  8.4  BNP    Component Value Date/Time   BNP 31.3 04/02/2021 1626    ProBNP No results found for: PROBNP  PFT    Component Value Date/Time   FEV1PRE 1.13 03/26/2022 1452   FEV1POST 1.24 03/26/2022 1452   FVCPRE 2.88 03/26/2022 1452   FVCPOST 3.06 03/26/2022 1452   TLC 11.07 03/26/2022 1452   DLCOUNC 9.21 03/26/2022 1452   PREFEV1FVCRT 39 03/26/2022 1452   PSTFEV1FVCRT 41 03/26/2022 1452    NM PET Image Restage (PS) Skull Base to Thigh (F-18 FDG) Result Date: 02/26/2024 EXAM: PET AND CT SKULL BASE TO MID THIGH 02/24/2024 05:58:44 PM TECHNIQUE: RADIOPHARMACEUTICAL: 7.44 mCi F-18 FDG IV LAC/CD Uptake time 60 minutes. Glucose level 109 mg/dL. PET imaging was acquired from the base of the skull to the mid thighs. Non-contrast enhanced computed tomography was obtained for attenuation correction and anatomic localization. COMPARISON: PREV. PET 10/09/2023. CLINICAL HISTORY: RESTAGING: Lung nodules, multiple. FINDINGS: HEAD AND NECK: No metabolically active cervical lymphadenopathy. CHEST: The previously biopsied nodule in the left lower lobe measures 12 mm compared to 14 mm with SUVmax of 2.7 compared to SUVmax of 5.3 (Image 78). There are several additional hypermetabolic nodules which have increased in the interval. For example, a  new nodule in the medial aspect of the left upper lobe on image 58 measures 13 mm with SUVmax equal to 3.1. Nodule in the medial aspect of the right upper lobe measures 5 mm unchanged from 5 mm with SUVmax equal to 2.5 increased from 1.3. Ill defined lesion in the right upper lobe measuring 12 mm on the 7/9 measuring 3.0 cm, new from prior. Nodule within the medial aspect of the right lower lobe measuring 9 mm on image 75 with SUVmax equal 1.9 is new for comparison exam. Similar new nodule on image 84 in the right lower lobe. No hypermetabolic lymph nodes. ABDOMEN AND PELVIS: No metabolically active lymphadenopathy. Physiologic activity within the gastrointestinal and genitourinary systems. BONES AND SOFT TISSUE: No metabolically active aggressive osseous lesion. Overall pattern is an increase in the size of metabolic pulmonary nodules with several new pulmonary nodules. Unusual presentation of metastatic disease. Potential waxing and waning inflammatory nodules. Overall, the pattern is worsening, therefore repeat biopsy may be warranted. No evidence of metastatic adenopathy. IMPRESSION: 1. Overall pattern is an increase in the size of metabolic pulmonary nodules with several new pulmonary nodules. 2. Unusual presentation of metastatic disease. Potential waxing and waning inflammatory nodules. 3. Overall, the pattern is worsening, therefore repeat biopsy may be warranted. 4. No evidence of metastatic adenopathy or distant metastatic disease. Electronically signed by: Norleen Boxer MD 02/26/2024 12:39 PM EST RP Workstation: [id number]   CT CHEST WO CONTRAST Result Date: 02/06/2024 CLINICAL DATA:  Lung nodule, > 8mm EXAM: CT CHEST WITHOUT CONTRAST TECHNIQUE: Multidetector CT imaging of the chest was performed following the standard protocol without IV contrast. RADIATION DOSE REDUCTION: This exam was performed according to the departmental dose-optimization program which includes automated exposure control,  adjustment of the mA and/or kV according to patient size and/or use of iterative reconstruction technique. COMPARISON:  10/09/2023, 09/10/2023 FINDINGS: Cardiovascular: Unenhanced imaging of the heart is unremarkable without pericardial effusion. No evidence of thoracic aortic aneurysm. Stable atherosclerosis of the aorta and coronary vasculature. Assessment of the vascular lumen cannot be performed without intravenous contrast. Mediastinum/Nodes: Stable appearance of the thyroid, trachea, and esophagus. No pathologic adenopathy. Lungs/Pleura: Severe bullous emphysema with chronic right apical pleural and parenchymal scarring. There is no acute airspace disease, effusion, or pneumothorax.  There are multiple bilateral spiculated pulmonary nodules. A subpleural 14 x 11 mm left lower lobe pulmonary nodule reference image 78/5 is decreased in size since prior exam where this had measured 19 x 16 mm. This demonstrated significant increased metabolic activity on the prior PET scan however and remains highly concerning for neoplasm. There is also a 5 x 4 mm left upper lobe pulmonary nodule reference image 50/5, similar in size to prior study where there was also increased metabolic activity on PET scan. Multiple new spiculated nodules have developed in the interim since the prior study, with index lesions as follows: Left upper lobe, image 41/5, 14 x 16 mm. Right lower lobe, image 75/5, 14 x 8 mm. Right upper lobe, image 77/5, 14 x 9 mm. Right lower lobe, image 92/5, 16 x 14 mm. Left lower lobe, image 107/5, 9 x 5 mm. The central airways are patent. Upper Abdomen: Stable low-attenuation left adrenal thickening measuring up to 19 mm most consistent with adenoma. Given stability and lack of metabolic activity on prior PET scan no specific follow-up is recommended. Musculoskeletal: There are no acute or destructive bony abnormalities. Reconstructed images demonstrate no additional findings. IMPRESSION: 1. Multiple bilateral  spiculated pulmonary nodules as above. There has been slight decrease in the subpleural left lower lobe nodule seen previously, with stable appearance of the prior left upper lobe pulmonary nodule. However, multiple new bilateral spiculated pulmonary nodules have developed with index lesions provided above. Findings are once again highly concerning for malignancy. Further evaluation with repeat PET scan or repeat bronchoscopy and tissue sampling is recommended. 2. Pulmonary emphysema is present. Pulmonary emphysema is an independent risk factor for lung cancer. Recommend patient be considered for lung cancer screening. [place]  [employer]. Screening for Lung Cancer: [place]  Licensed [employer]. JAMA. 2021;325(10):[id number]. 3.  Aortic Atherosclerosis (ICD10-I70.0). These results will be called to the ordering clinician or representative by the Radiologist Assistant, and communication documented in the PACS or [employer]. Electronically Signed   By: Ozell Daring M.D.   On: 02/06/2024 20:32     Past medical hx Past Medical History:  Diagnosis Date   Adenomatous colon polyp    Atrial fibrillation (HCC)    Centrilobular emphysema (HCC)    COPD (chronic obstructive pulmonary disease) (HCC)    Coronary artery disease    History of DVT of lower extremity    Hypercholesterolemia    Hypercholesterolemia    prostate ca dx'd 03/2012   surg only   Skin cancer    Tobacco dependence      Social History[1]  Mr.Laird reports that he has been smoking cigarettes. He has a 56 pack-year smoking history. He has never used smokeless tobacco. He reports current alcohol use. He reports that he does not use drugs.  Tobacco Cessation: Ready to quit: Not Answered Counseling given: Not Answered Tobacco comments: 1 pack a day 02/27/2024 KRD   Past surgical hx, Family hx, Social hx all reviewed.  Current Outpatient Medications on File Prior to Visit  Medication  Sig   acetaminophen  (TYLENOL ) 325 MG tablet Take 325 mg by mouth every 6 (six) hours as needed (for pain.).   albuterol  (VENTOLIN  HFA) 108 (90 Base) MCG/ACT inhaler TAKE 2 PUFFS BY MOUTH EVERY 6 HOURS AS NEEDED FOR WHEEZE OR SHORTNESS OF BREATH   atorvastatin  (LIPITOR) 40 MG tablet TAKE 1 TABLET BY MOUTH EVERY DAY   Fluticasone -Umeclidin-Vilant (TRELEGY ELLIPTA ) 200-62.5-25 MCG/ACT AEPB Inhale 1 puff into the lungs daily. **discontinue  previous rx**   isosorbide  mononitrate (IMDUR ) 30 MG 24 hr tablet TAKE 1 TABLET BY MOUTH AT BEDTIME.   latanoprost  (XALATAN ) 0.005 % ophthalmic solution Place 1 drop into both eyes at bedtime.   niacin  500 MG tablet Take 500 mg by mouth at bedtime.   nitroGLYCERIN  (NITROSTAT ) 0.4 MG SL tablet Place 1 tablet (0.4 mg total) under the tongue every 5 (five) minutes as needed for chest pain.   rivaroxaban  (XARELTO ) 20 MG TABS tablet Take 20 mg by mouth daily with supper.   vitamin C (ASCORBIC ACID ) 500 MG tablet Take 500 mg by mouth daily.   vitamin E  180 MG (400 UNITS) capsule Take 400 Units by mouth daily.   No current facility-administered medications on file prior to visit.     Allergies[2]  Review Of Systems:  Constitutional:   No  weight loss, night sweats,  Fevers, chills, fatigue, or  lassitude.  HEENT:   No headaches,  Difficulty swallowing,  Tooth/dental problems, or  Sore throat,                No sneezing, itching, ear ache, nasal congestion, post nasal drip,   CV:  No chest pain,  Orthopnea, PND, swelling in lower extremities, anasarca, dizziness, palpitations, syncope.   GI  No heartburn, indigestion, abdominal pain, nausea, vomiting, diarrhea, change in bowel habits, loss of appetite, bloody stools.   Resp: No shortness of breath with exertion or at rest.  No excess mucus, no productive cough,  No non-productive cough,  No coughing up of blood.  No change in color of mucus.  No wheezing.  No chest wall deformity  Skin: no rash or  lesions.  GU: no dysuria, change in color of urine, no urgency or frequency.  No flank pain, no hematuria   MS:  No joint pain or swelling.  No decreased range of motion.  No back pain.  Psych:  No change in mood or affect. No depression or anxiety.  No memory loss.   Vital Signs BP 112/60   Pulse 67   Temp 97.9 F (36.6 C) (Oral)   Ht 6' (1.829 m)   Wt 152 lb 9.6 oz (69.2 kg)   SpO2 94%   BMI 20.70 kg/m    Physical Exam:  General- No distress,  A&Ox3 ENT: No sinus tenderness, TM clear, pale nasal mucosa, no oral exudate,no post nasal drip, no LAN Cardiac: S1, S2, regular rate and rhythm, no murmur Chest: No wheeze/ rales/ dullness; no accessory muscle use, no nasal flaring, no sternal retractions Abd.: Soft Non-tender Ext: No clubbing cyanosis, edema Neuro:  normal strength Skin: No rashes, warm and dry Psych: normal mood and behavior  Physical Exam    Assessment/Plan  Assessment and Plan Assessment & Plan        Lauraine JULIANNA Lites, NP 02/27/2024  9:34 AM             [1]  Social History Tobacco Use   Smoking status: Every Day    Current packs/day: 1.00    Average packs/day: 1 pack/day for 56.0 years (56.0 ttl pk-yrs)    Types: Cigarettes   Smokeless tobacco: Never   Tobacco comments:    1 pack a day 02/27/2024 KRD  Vaping Use   Vaping status: Never Used  Substance Use Topics   Alcohol use: Yes    Comment: socially   Drug use: No  [2]  Allergies Allergen Reactions   Rosuvastatin     Other Reaction(s): sluggish

## 2024-03-01 ENCOUNTER — Encounter (HOSPITAL_COMMUNITY): Payer: Self-pay | Admitting: Emergency Medicine

## 2024-03-01 ENCOUNTER — Other Ambulatory Visit (HOSPITAL_COMMUNITY)

## 2024-03-01 ENCOUNTER — Other Ambulatory Visit: Payer: Self-pay

## 2024-03-01 NOTE — Progress Notes (Signed)
 Anesthesia Chart Review: SAME DAY WORK-UP  Case: [id number] Date/Time: 03/02/24 1100   Procedure: VIDEO BRONCHOSCOPY WITH ENDOBRONCHIAL NAVIGATION (Left)   Anesthesia type: General   Diagnosis:      Lung nodules [R91.8]     Abnormal chest x-ray with multiple lung nodules [R91.8]   Pre-op diagnosis: lung nodules   [facility] / [facility]   Surgeons: Shelah Lamar RAMAN, MD       DISCUSSION: Patient is a 73 year old male scheduled for the above procedure.  S/p video bronchoscopy 10/28/2023 for a suspicious LLL hypermetabolic spiculated lung nodule. Biopsies were negative for malignancy, although still some concern so plan for 3 month imaging and consider repeat bronchoscopy if any growth. Follow-up 02/02/2024 CT showed multiple new spiculated nodules that have developed in the interim since the prior study. Dr. Shelah reviewed the scan and recommended repeat bronchoscopy with biopsies    History includes smoking, COPD/emphysema, CAD (50% mRCA & 60% mLM, medical therapy recommended based on IVUS results 10/17/2022), atrial fibrillation, hypercholesterolemia, skin cancer, DVT (LLE 2014, 2018 likely acute on chronic), prostate cancer (s/p robot-assisted laparoscopic radical prostatectomy 07/09/2012), ventral hernia (s/p repair 05/22/2016).   Last cardiology visit was on 11/25/2023 with Wendel Haws, MD. On Imdur , atorvastatin  for CAD, moderate non-obstructive in LM and RCA by 10/2022 LHC. On Xarelto  indefinitely due to recurrent DVT. No routine or regular angina. He was doing well without symptoms to suggest angina. Dyspnea felt related to significant pulmonary disease. LDL 55, so no medication changes made. Consider ARB and SGLT2 inhibitor in the future for diastolic dysfunction. One year follow-up planned.    Pulmonology advised last Xarelto  02/28/2024 for procedure.     Anesthesia team to evaluated on the day of surgery.     VS:  Wt Readings from Last 3 Encounters:  02/27/24  69.2 kg  02/18/24 68 kg  11/25/23 69.7 kg   BP Readings from Last 3 Encounters:  02/27/24 112/60  02/18/24 112/60  11/25/23 116/77   Pulse Readings from Last 3 Encounters:  02/27/24 82  02/18/24 71  11/25/23 78     PROVIDERS: Auston Opal, DO is PCP  Wendel Haws, MD is cardiologist  Neda Hammond, MD is pulmonologist     LABS: For day of procedure as indicated. Most recent results in [facility] include: Lab Results  Component Value Date   WBC 5.9 10/28/2023   HGB 14.4 10/28/2023   HCT 43.5 10/28/2023   PLT 190 10/28/2023   GLUCOSE 104 (H) 10/28/2023   CHOL 113 11/26/2023   TRIG 59 11/26/2023   HDL 45 11/26/2023   LDLCALC 55 11/26/2023   ALT 12 11/26/2023   AST 13 11/26/2023   NA 140 10/28/2023   K 4.3 10/28/2023   CL 103 10/28/2023   CREATININE 0.90 10/28/2023   BUN 10 10/28/2023   CO2 28 10/28/2023    PFTs 03/26/2022: FVC 2.88 (61%), post 3.06 (65%). FEV1 1.13 (32%), post 1.24 (36%). DLCO unc/cor 9.21 (33%).  - Conclusions: Reduced diffusing capacity, severe airway obstruction, normal airway resistance and relatively normal inspiratory flows or characteristic of emphysema.  In view of the severity of the diffusion defect, studies with exercise would be helpful to evaluate the presence of hypoxemia. Diagnosis: Severe obstructive airways disease, emphysematous type.   IMAGES: PET Scan 02/24/2024:: IMPRESSION: 1. Overall pattern is an increase in the size of metabolic pulmonary nodules with several new pulmonary nodules. 2. Unusual presentation of metastatic disease. Potential waxing and waning inflammatory nodules. 3. Overall,  the pattern is worsening, therefore repeat biopsy may be warranted. 4. No evidence of metastatic adenopathy or distant metastatic disease.   CT Chest 02/02/2024:  IMPRESSION: 1. Multiple bilateral spiculated pulmonary nodules as above. There has been slight decrease in the subpleural left lower lobe nodule seen previously, with stable  appearance of the prior left upper lobe pulmonary nodule. However, multiple new bilateral spiculated pulmonary nodules have developed with index lesions provided above. Findings are once again highly concerning for malignancy. Further evaluation with repeat PET scan or repeat bronchoscopy and tissue sampling is recommended. 2. Pulmonary emphysema is present. Pulmonary emphysema is an independent risk factor for lung cancer. Recommend patient be considered for lung cancer screening. [place]  Chief Financial Officer. Screening for Lung Cancer: [place]  Licensed Conveyancer. JAMA. 2021;325(10):962-970. 3.  Aortic Atherosclerosis (ICD10-I70.0).     EKG:  EKG 10/28/2023: Sinus rhythm with marked sinus arrhythmia with 1st degree A-V block Incomplete right bundle branch block Septal infarct , age undetermined Abnormal ECG When compared with ECG of 17-Oct-2022 13:49, No significant change was found Confirmed by Mallipeddi, Vishnu Priya [phone]) on 10/31/2023 1:14:42 PM  EKG 10/17/2023: Sinus rhythm with marked sinus arrhythmia with 1st degree A-V block Incomplete right bundle branch block Nonspecific ST and T wave abnormality Abnormal ECG When compared with ECG of 26-Sep-2022 08:00, No significant change was found and the previous tracing read as atrial fibrillation is an error Confirmed by Fernande Standing [phone]) on 10/19/2022 4:21:59 PM       CV: Echo 10/23/2022: IMPRESSIONS   1. Left ventricular ejection fraction, by estimation, is 55 to 60%. The  left ventricle has normal function. The left ventricle has no regional  wall motion abnormalities. There is mild concentric left ventricular  hypertrophy. Left ventricular diastolic  parameters are consistent with Grade I diastolic dysfunction (impaired  relaxation).   2. Right ventricular systolic function is normal. The right ventricular  size is normal. Tricuspid regurgitation signal is inadequate for assessing  PA  pressure.   3. The mitral valve is grossly normal. No evidence of mitral valve  regurgitation. No evidence of mitral stenosis.   4. The aortic valve has an indeterminant number of cusps. There is  moderate calcification of the aortic valve. There is moderate thickening  of the aortic valve. Aortic valve regurgitation is not visualized. Aortic  valve sclerosis/calcification is  present, without any evidence of aortic stenosis.   5. The inferior vena cava is normal in size with greater than 50%  respiratory variability, suggesting right atrial pressure of 3 mmHg.      Cardiac cath 10/17/2022:   Mid RCA lesion is 50% stenosed.   Mid LM lesion is 60% stenosed.   1.  Angiographically significant mid left main disease however IVUS interrogation showed minimal luminal area of greater than 6 mm and RFR (with the guiding catheter disengaged) was 1.0. 2.  Diffusely diseased right coronary artery with FFR angiography assessment of 0.72 at its midportion however the FFR decreases throughout the vessel indicating diffuse disease. 3.  LVEDP of 2 mmHg.   Recommendation: Medical therapy.  It is unlikely that the diffusely diseased right coronary artery is causing dyspnea or chest pain.  If PCI of the right coronary artery were pursued, very long segment of stents would be required.     US  Carotid 10/09/2022: Summary:  - Right Carotid: Moderate: plaque visualized with moderate increase in velocity in the right internal carotid artery.  - Left Carotid: Mild: plaque visualized  in the left internal carotid artery.       Long term monitor 09/26/2022 - 09/29/2022: Patient had a min HR of 52 bpm, max HR of 109 bpm, and avg HR of 77 bpm. Predominant underlying rhythm was Sinus Rhythm.    EVENTS: -1 run of Supraventricular Tachycardia occurred lasting 6 beats with a max rate of 109 bpm (avg 99 bpm).  -Isolated SVEs were occasional (2.4%, 8320), SVE Couplets were occasional (3.3%, 5755), and SVE Triplets were  occasional (1.0%, 1200).  -Isolated VEs were occasional (3.2%, 11046), VE Couplets were rare (<1.0%, 26), and no VE Triplets were present. Ventricular Bigeminy and Trigeminy were present.   No atrial fibrillation, sustained ventricular tachyarrhythmias, or bradyarrhythmias were detected. No patient triggered events.     US  Abd Aorta 06/14/2021: Summary:  Abdominal Aorta: No evidence of an abdominal aortic aneurysm was  visualized. The largest aortic measurement is 2.7 cm. Abnormal tapering of  the abdominal aorta is evident. The proximal aorta is ectatic, with  largest measurements at 2.7 cm.  - Widely patent left common and external iliac arteries without evidence of  stenosis.  - IVC/Iliac: Patent IVC.      Past Medical History:  Diagnosis Date   Adenomatous colon polyp    Atrial fibrillation (HCC)    Centrilobular emphysema (HCC)    COPD (chronic obstructive pulmonary disease) (HCC)    Coronary artery disease    History of DVT of lower extremity    Hypercholesterolemia    Hypercholesterolemia    prostate ca dx'd 03/2012   surg only   Skin cancer    Tobacco dependence     Past Surgical History:  Procedure Laterality Date   APPENDECTOMY     June 14, 2015   COLONOSCOPY  1/14   CORONARY PRESSURE/FFR STUDY N/A 10/17/2022   Procedure: CORONARY PRESSURE/FFR STUDY;  Surgeon: Wendel Lurena POUR, MD;  [facility];  Service: Cardiovascular;  Laterality: N/A;   CORONARY PRESSURE/FFR WITH 3D MAPPING N/A 10/17/2022   Procedure: Coronary Pressure/FFR w/3D Mapping;  Surgeon: Wendel Lurena POUR, MD;  [facility];  Service: Cardiovascular;  Laterality: N/A;   CORONARY ULTRASOUND/IVUS N/A 10/17/2022   Procedure: Coronary Ultrasound/IVUS;  Surgeon: Wendel Lurena POUR, MD;  [facility];  Service: Cardiovascular;  Laterality: N/A;   INSERTION OF MESH N/A 05/22/2016   Procedure: INSERTION OF MESH;  Surgeon: Mitzie DELENA Freund, MD;  Location: [facility];  Service:  General;  Laterality: N/A;   LEFT HEART CATH AND CORONARY ANGIOGRAPHY N/A 10/17/2022   Procedure: LEFT HEART CATH AND CORONARY ANGIOGRAPHY;  Surgeon: Wendel Lurena POUR, MD;  [facility];  Service: Cardiovascular;  Laterality: N/A;   LYMPHADENECTOMY Bilateral 07/09/2012   Procedure: LYMPHADENECTOMY;  Surgeon: Noretta Ferrara, MD;  Location: [facility]S;  Service: Urology;  Laterality: Bilateral;   ROBOT ASSISTED LAPAROSCOPIC RADICAL PROSTATECTOMY N/A 07/09/2012   Procedure: ROBOTIC ASSISTED LAPAROSCOPIC RADICAL PROSTATECTOMY LEVEL 2;  Surgeon: Noretta Ferrara, MD;  Location: [facility];  Service: Urology;  Laterality: N/A;   TONSILLECTOMY     VENTRAL HERNIA REPAIR N/A 05/22/2016   Procedure: LAPAROSCOPIC VENTRAL HERNIA REPAIR WITH MESH;  Surgeon: Mitzie DELENA Freund, MD;  Location: [facility];  Service: General;  Laterality: N/A;   VIDEO BRONCHOSCOPY WITH ENDOBRONCHIAL NAVIGATION Left 10/28/2023   Procedure: VIDEO BRONCHOSCOPY WITH ENDOBRONCHIAL NAVIGATION;  Surgeon: Shelah Lamar RAMAN, MD;  Location: [facility];  Service: Pulmonary;  Laterality: Left;    MEDICATIONS:  acetaminophen  (TYLENOL )  325 MG tablet   albuterol  (VENTOLIN  HFA) 108 (90 Base) MCG/ACT inhaler   atorvastatin  (LIPITOR) 40 MG tablet   Fluticasone -Umeclidin-Vilant (TRELEGY ELLIPTA ) 200-62.5-25 MCG/ACT AEPB   isosorbide  mononitrate (IMDUR ) 30 MG 24 hr tablet   latanoprost  (XALATAN ) 0.005 % ophthalmic solution   niacin  500 MG tablet   nitroGLYCERIN  (NITROSTAT ) 0.4 MG SL tablet   rivaroxaban  (XARELTO ) 20 MG TABS tablet   vitamin C (ASCORBIC ACID ) 500 MG tablet   vitamin E  180 MG (400 UNITS) capsule    Brittiney Dicostanzo, PA-C Surgical Short Stay/Anesthesiology [facility] Phone [phone] [facility] Phone [phone] 03/01/2024 11:15 AM

## 2024-03-01 NOTE — Progress Notes (Signed)
 SDW call  Patient was given pre-op instructions over the phone. Patient verbalized understanding of instructions provided.  Denied any SOB, fever or cough.    PCP - Dr. Milo Costa Cardiologist - Dr. Lurena Red, LOV 11/25/2023 Pulmonary:    PPM/ICD - denies Device Orders - na Rep Notified - na   Chest x-ray - 10/28/2023 EKG -  10/28/2023 Stress Test -10/10/2022 ECHO - 10/23/2022 Cardiac Cath - 10/17/2022 PFT: 03/26/2022  Sleep Study/sleep apnea/CPAP: denies   Non-diabetic  Blood Thinner Instructions: Xarelto , hold 48 hrs. States last dose 02/28/2024 Aspirin  Instructions:denies   ERAS Protcol - NPO   Anesthesia review: Yes. CAD, COPD, A-fib  Your procedure is scheduled on Tuesday March 02, 2024  Report to [facility] Main Entrance A at  0830  A.M., then check in with the Admitting office.  Call this number if you have problems the morning of surgery:  [phone]   If you have any questions prior to your surgery date call [phone]: Open Monday-Friday 8am-4pm If you experience any cold or flu symptoms such as cough, fever, chills, shortness of breath, etc. between now and your scheduled surgery, please notify us  at the above number    Remember:  Do not eat or drink after midnight the night before your surgery Take these medicines the morning of surgery with A SIP OF WATER :  Atorvastatin , trelegy ellipta   As needed: Tylenol , albuterol   As of today, STOP taking any Aspirin  (unless otherwise instructed by your surgeon) Aleve, Naproxen, Ibuprofen, Motrin, Advil, Goody's, BC's, all herbal medications, fish oil, and all vitamins.

## 2024-03-01 NOTE — Anesthesia Preprocedure Evaluation (Signed)
 Anesthesia Evaluation  Patient identified by MRN, date of birth, ID band Patient awake    Reviewed: Allergy & Precautions, NPO status , Patient's Chart, lab work & pertinent test results, reviewed documented beta blocker date and time   History of Anesthesia Complications Negative for: history of anesthetic complications  Airway Mallampati: II  TM Distance: >3 FB     Dental no notable dental hx.    Pulmonary shortness of breath and with exertion, COPD, neg recent URI, Current Smoker and Patient abstained from smoking.   breath sounds clear to auscultation       Cardiovascular (-) hypertension+ CAD, + Peripheral Vascular Disease and + DOE  (-) Past MI  Rhythm:Regular Rate:Normal  IMPRESSIONS     1. Left ventricular ejection fraction, by estimation, is 55 to 60%. The  left ventricle has normal function. The left ventricle has no regional  wall motion abnormalities. There is mild concentric left ventricular  hypertrophy. Left ventricular diastolic  parameters are consistent with Grade I diastolic dysfunction (impaired  relaxation).   2. Right ventricular systolic function is normal. The right ventricular  size is normal. Tricuspid regurgitation signal is inadequate for assessing  PA pressure.   3. The mitral valve is grossly normal. No evidence of mitral valve  regurgitation. No evidence of mitral stenosis.   4. The aortic valve has an indeterminant number of cusps. There is  moderate calcification of the aortic valve. There is moderate thickening  of the aortic valve. Aortic valve regurgitation is not visualized. Aortic  valve sclerosis/calcification is  present, without any evidence of aortic stenosis.   5. The inferior vena cava is normal in size with greater than 50%  respiratory variability, suggesting right atrial pressure of 3 mmHg.      Neuro/Psych neg Seizures PSYCHIATRIC DISORDERS         GI/Hepatic ,,,(+) neg  Cirrhosis        Endo/Other    Renal/GU Renal disease     Musculoskeletal   Abdominal   Peds  Hematology   Anesthesia Other Findings   Reproductive/Obstetrics                              Anesthesia Physical Anesthesia Plan  ASA: 2  Anesthesia Plan: General   Post-op Pain Management:    Induction: Intravenous  PONV Risk Score and Plan: 2 and Ondansetron  and Dexamethasone   Airway Management Planned: Oral ETT  Additional Equipment:   Intra-op Plan:   Post-operative Plan: Extubation in OR  Informed Consent: I have reviewed the patients History and Physical, chart, labs and discussed the procedure including the risks, benefits and alternatives for the proposed anesthesia with the patient or authorized representative who has indicated his/her understanding and acceptance.     Dental advisory given  Plan Discussed with: CRNA  Anesthesia Plan Comments: (PAT note written 03/01/2024 by Mai Longnecker, PA-C.  )         Anesthesia Quick Evaluation

## 2024-03-02 ENCOUNTER — Encounter: Payer: Self-pay | Admitting: Acute Care

## 2024-03-02 ENCOUNTER — Ambulatory Visit (HOSPITAL_COMMUNITY): Payer: Self-pay | Admitting: Vascular Surgery

## 2024-03-02 ENCOUNTER — Ambulatory Visit (HOSPITAL_COMMUNITY)

## 2024-03-02 ENCOUNTER — Encounter (HOSPITAL_COMMUNITY): Payer: Self-pay | Admitting: Emergency Medicine

## 2024-03-02 ENCOUNTER — Telehealth: Payer: Self-pay | Admitting: Acute Care

## 2024-03-02 ENCOUNTER — Encounter (HOSPITAL_COMMUNITY): Admission: RE | Disposition: A | Payer: Self-pay | Source: Home / Self Care | Attending: Emergency Medicine

## 2024-03-02 ENCOUNTER — Other Ambulatory Visit: Payer: Self-pay

## 2024-03-02 ENCOUNTER — Ambulatory Visit (HOSPITAL_COMMUNITY)
Admission: RE | Admit: 2024-03-02 | Discharge: 2024-03-02 | Disposition: A | Attending: Emergency Medicine | Admitting: Emergency Medicine

## 2024-03-02 DIAGNOSIS — R911 Solitary pulmonary nodule: Secondary | ICD-10-CM

## 2024-03-02 DIAGNOSIS — E7800 Pure hypercholesterolemia, unspecified: Secondary | ICD-10-CM | POA: Insufficient documentation

## 2024-03-02 DIAGNOSIS — R918 Other nonspecific abnormal finding of lung field: Secondary | ICD-10-CM

## 2024-03-02 DIAGNOSIS — I2510 Atherosclerotic heart disease of native coronary artery without angina pectoris: Secondary | ICD-10-CM | POA: Insufficient documentation

## 2024-03-02 DIAGNOSIS — I493 Ventricular premature depolarization: Secondary | ICD-10-CM | POA: Diagnosis not present

## 2024-03-02 DIAGNOSIS — J432 Centrilobular emphysema: Secondary | ICD-10-CM | POA: Insufficient documentation

## 2024-03-02 DIAGNOSIS — F17210 Nicotine dependence, cigarettes, uncomplicated: Secondary | ICD-10-CM | POA: Insufficient documentation

## 2024-03-02 DIAGNOSIS — Z85828 Personal history of other malignant neoplasm of skin: Secondary | ICD-10-CM | POA: Diagnosis not present

## 2024-03-02 DIAGNOSIS — Z86718 Personal history of other venous thrombosis and embolism: Secondary | ICD-10-CM | POA: Insufficient documentation

## 2024-03-02 DIAGNOSIS — Z9079 Acquired absence of other genital organ(s): Secondary | ICD-10-CM | POA: Insufficient documentation

## 2024-03-02 DIAGNOSIS — Z79899 Other long term (current) drug therapy: Secondary | ICD-10-CM | POA: Diagnosis not present

## 2024-03-02 DIAGNOSIS — Z6820 Body mass index (BMI) 20.0-20.9, adult: Secondary | ICD-10-CM | POA: Diagnosis not present

## 2024-03-02 DIAGNOSIS — I4891 Unspecified atrial fibrillation: Secondary | ICD-10-CM | POA: Insufficient documentation

## 2024-03-02 DIAGNOSIS — R634 Abnormal weight loss: Secondary | ICD-10-CM | POA: Diagnosis not present

## 2024-03-02 DIAGNOSIS — Z8546 Personal history of malignant neoplasm of prostate: Secondary | ICD-10-CM | POA: Diagnosis not present

## 2024-03-02 HISTORY — PX: BRONCHIAL WASHINGS: SHX5105

## 2024-03-02 HISTORY — PX: FIDUCIAL MARKER PLACEMENT: SHX6858

## 2024-03-02 HISTORY — PX: BRONCHIAL BIOPSY: SHX5109

## 2024-03-02 HISTORY — PX: BRONCHIAL NEEDLE ASPIRATION BIOPSY: SHX5106

## 2024-03-02 HISTORY — PX: VIDEO BRONCHOSCOPY WITH ENDOBRONCHIAL NAVIGATION: SHX6175

## 2024-03-02 LAB — BASIC METABOLIC PANEL WITH GFR
Anion gap: 8 (ref 5–15)
BUN: 14 mg/dL (ref 8–23)
CO2: 29 mmol/L (ref 22–32)
Calcium: 8.5 mg/dL — ABNORMAL LOW (ref 8.9–10.3)
Chloride: 102 mmol/L (ref 98–111)
Creatinine, Ser: 0.89 mg/dL (ref 0.61–1.24)
GFR, Estimated: >60 mL/min (ref >60)
Glucose, Bld: 111 mg/dL — ABNORMAL HIGH (ref 70–99)
Potassium: 3.9 mmol/L (ref 3.5–5.1)
Sodium: 139 mmol/L (ref 135–145)

## 2024-03-02 LAB — CBC
HCT: 44.5 % (ref 39.0–52.0)
Hemoglobin: 14.7 g/dL (ref 13.0–17.0)
MCH: 33.9 pg (ref 26.0–34.0)
MCHC: 33 g/dL (ref 30.0–36.0)
MCV: 102.8 fL — ABNORMAL HIGH (ref 80.0–100.0)
Platelets: 155 K/uL (ref 150–400)
RBC: 4.33 MIL/uL (ref 4.22–5.81)
RDW: 12.4 % (ref 11.5–15.5)
WBC: 6.2 K/uL (ref 4.0–10.5)
nRBC: 0 % (ref 0.0–0.2)

## 2024-03-02 SURGERY — VIDEO BRONCHOSCOPY WITH ENDOBRONCHIAL NAVIGATION
Anesthesia: General | Laterality: Left

## 2024-03-02 MED ORDER — PHENYLEPHRINE HCL-NACL 20-0.9 MG/250ML-% IV SOLN
INTRAVENOUS | Status: DC | PRN
  Administered 2024-03-02: 11:00:00 100 ug/min via INTRAVENOUS

## 2024-03-02 MED ORDER — PROPOFOL 10 MG/ML IV BOLUS
INTRAVENOUS | Status: DC | PRN
  Administered 2024-03-02: 11:00:00 120 mg via INTRAVENOUS
  Administered 2024-03-02: 11:00:00 50 mg via INTRAVENOUS

## 2024-03-02 MED ORDER — SUGAMMADEX SODIUM 200 MG/2ML IV SOLN
INTRAVENOUS | Status: DC | PRN
  Administered 2024-03-02: 13:00:00 200 mg via INTRAVENOUS

## 2024-03-02 MED ORDER — IPRATROPIUM-ALBUTEROL 0.5-2.5 (3) MG/3ML IN SOLN
RESPIRATORY_TRACT | Status: AC
  Filled 2024-03-02: qty 3

## 2024-03-02 MED ORDER — OXYCODONE HCL 5 MG/5ML PO SOLN: 5.0000 mg | Freq: Once | ORAL | Status: DC | PRN

## 2024-03-02 MED ORDER — CHLORHEXIDINE GLUCONATE 0.12 % MT SOLN
15.0000 mL | Freq: Once | OROMUCOSAL | Status: AC
  Administered 2024-03-02: 09:00:00 15 mL via OROMUCOSAL
  Filled 2024-03-02: qty 15

## 2024-03-02 MED ORDER — OXYCODONE HCL 5 MG PO TABS: 5.0000 mg | ORAL_TABLET | Freq: Once | ORAL | Status: DC | PRN

## 2024-03-02 MED ORDER — ROCURONIUM BROMIDE 10 MG/ML (PF) SYRINGE
PREFILLED_SYRINGE | INTRAVENOUS | Status: DC | PRN
  Administered 2024-03-02: 11:00:00 60 mg via INTRAVENOUS
  Administered 2024-03-02: 12:00:00 20 mg via INTRAVENOUS

## 2024-03-02 MED ORDER — LIDOCAINE 2% (20 MG/ML) 5 ML SYRINGE
INTRAMUSCULAR | Status: DC | PRN
  Administered 2024-03-02: 11:00:00 50 mg via INTRAVENOUS

## 2024-03-02 MED ORDER — PHENYLEPHRINE 80 MCG/ML (10ML) SYRINGE FOR IV PUSH (FOR BLOOD PRESSURE SUPPORT)
PREFILLED_SYRINGE | INTRAVENOUS | Status: DC | PRN
  Administered 2024-03-02 (×2): 80 ug via INTRAVENOUS
  Administered 2024-03-02 (×3): 160 ug via INTRAVENOUS

## 2024-03-02 MED ORDER — ALBUMIN HUMAN 5 % IV SOLN
INTRAVENOUS | Status: AC
  Filled 2024-03-02: qty 250

## 2024-03-02 MED ORDER — ONDANSETRON HCL 4 MG/2ML IJ SOLN
INTRAMUSCULAR | Status: DC | PRN
  Administered 2024-03-02: 13:00:00 4 mg via INTRAVENOUS

## 2024-03-02 MED ORDER — FENTANYL CITRATE (PF) 100 MCG/2ML IJ SOLN: 25.0000 ug | INTRAMUSCULAR | Status: DC | PRN

## 2024-03-02 MED ORDER — ALBUMIN HUMAN 25 % IV SOLN: 12.5000 g | Freq: Once | INTRAVENOUS | Status: DC

## 2024-03-02 MED ORDER — LACTATED RINGERS IV SOLN: INTRAVENOUS | Status: DC

## 2024-03-02 MED ORDER — IPRATROPIUM-ALBUTEROL 0.5-2.5 (3) MG/3ML IN SOLN
3.0000 mL | Freq: Once | RESPIRATORY_TRACT | Status: AC
  Administered 2024-03-02: 13:00:00 3 mL via RESPIRATORY_TRACT

## 2024-03-02 MED ORDER — EPHEDRINE SULFATE-NACL 50-0.9 MG/10ML-% IV SOSY
PREFILLED_SYRINGE | INTRAVENOUS | Status: DC | PRN
  Administered 2024-03-02 (×3): 5 mg via INTRAVENOUS

## 2024-03-02 MED ORDER — ACETAMINOPHEN 10 MG/ML IV SOLN: 1000.0000 mg | Freq: Once | INTRAVENOUS | Status: DC | PRN

## 2024-03-02 MED ORDER — ALBUMIN HUMAN 5 % IV SOLN
12.5000 g | Freq: Once | INTRAVENOUS | Status: AC
  Administered 2024-03-02: 13:00:00 12.5 g via INTRAVENOUS

## 2024-03-02 MED ORDER — ONDANSETRON HCL 4 MG/2ML IJ SOLN: 4.0000 mg | Freq: Once | INTRAMUSCULAR | Status: DC | PRN

## 2024-03-02 SURGICAL SUPPLY — 38 items
ADAPTER BRONCHOSCOPE OLYMPUS (ADAPTER) ×2 IMPLANT
ADAPTER VALVE BIOPSY EBUS (MISCELLANEOUS) IMPLANT
BAG COUNTER SPONGE SURGICOUNT (BAG) ×2 IMPLANT
BRUSH CYTOL CELLEBRITY 1.5X140 (MISCELLANEOUS) ×2 IMPLANT
BRUSH SUPERTRAX BIOPSY (INSTRUMENTS) IMPLANT
BRUSH SUPERTRAX NDL-TIP CYTO (INSTRUMENTS) ×2 IMPLANT
CANISTER SUCTION 3000ML PPV (SUCTIONS) ×2 IMPLANT
CNTNR URN SCR LID CUP LEK RST (MISCELLANEOUS) ×2 IMPLANT
COVER BACK TABLE 60X90IN (DRAPES) ×2 IMPLANT
FILTER STRAW FLUID ASPIR (MISCELLANEOUS) IMPLANT
FORCEPS BIOP 1.5 SINGLE USE (MISCELLANEOUS) ×2 IMPLANT
FORCEPS BIOP SUPERTRX PREMAR (INSTRUMENTS) ×2 IMPLANT
GAUZE SPONGE 4X4 12PLY STRL (GAUZE/BANDAGES/DRESSINGS) ×2 IMPLANT
GLOVE BIO SURGEON STRL SZ7.5 (GLOVE) ×4 IMPLANT
GOWN STRL REUS W/ TWL LRG LVL3 (GOWN DISPOSABLE) ×4 IMPLANT
KIT CLEAN ENDO COMPLIANCE (KITS) ×2 IMPLANT
KIT LOCATABLE GUIDE (CANNULA) IMPLANT
KIT MARKER FIDUCIAL DELIVERY (KITS) IMPLANT
KIT TURNOVER KIT B (KITS) ×2 IMPLANT
MARKER SKIN DUAL TIP RULER LAB (MISCELLANEOUS) ×2 IMPLANT
NDL SUPERTRX PREMARK BIOPSY (NEEDLE) ×2 IMPLANT
NEEDLE SUPERTRX PREMARK BIOPSY (NEEDLE) ×2 IMPLANT
OIL SILICONE PENTAX (PARTS (SERVICE/REPAIRS)) ×2 IMPLANT
PAD ARMBOARD POSITIONER FOAM (MISCELLANEOUS) ×4 IMPLANT
PATCHES PATIENT (LABEL) ×6 IMPLANT
SOLN 0.9% NACL POUR BTL 1000ML (IV SOLUTION) ×2 IMPLANT
SOLN STERILE WATER BTL 1000 ML (IV SOLUTION) ×2 IMPLANT
SYR 20ML ECCENTRIC (SYRINGE) ×2 IMPLANT
SYR 20ML LL LF (SYRINGE) ×2 IMPLANT
SYR 50ML SLIP (SYRINGE) ×2 IMPLANT
SuperLock Fiducial Marker IMPLANT
TOWEL GREEN STERILE FF (TOWEL DISPOSABLE) ×2 IMPLANT
TRAP SPECIMEN MUCUS 40CC (MISCELLANEOUS) IMPLANT
TUBE CONNECTING 20X1/4 (TUBING) ×2 IMPLANT
UNDERPAD 30X36 HEAVY ABSORB (UNDERPADS AND DIAPERS) ×2 IMPLANT
VALVE BIOPSY SINGLE USE (MISCELLANEOUS) ×2 IMPLANT
VALVE SUCTION BRONCHIO DISP (MISCELLANEOUS) ×2 IMPLANT
superlock fiducial marker IMPLANT

## 2024-03-02 NOTE — Anesthesia Procedure Notes (Signed)
 Procedure Name: Intubation Date/Time: 03/02/2024 10:59 AM  Performed by: Keneth Lynwood POUR, MDPre-anesthesia Checklist: Patient identified, Emergency Drugs available, Suction available and Patient being monitored Patient Re-evaluated:Patient Re-evaluated prior to induction Oxygen Delivery Method: Circle system utilized Preoxygenation: Pre-oxygenation with 100% oxygen Induction Type: IV induction Ventilation: Mask ventilation without difficulty Laryngoscope Size: Miller and 3 Grade View: Grade II Tube type: Oral Tube size: 8.5 mm Number of attempts: 2 Airway Equipment and Method: Stylet and Oral airway Placement Confirmation: ETT inserted through vocal cords under direct vision, positive ETCO2 and breath sounds checked- equal and bilateral Secured at: 23 cm Tube secured with: Tape Dental Injury: Teeth and Oropharynx as per pre-operative assessment  Comments: 1st attempt Grade IIB view esophageal intubation

## 2024-03-02 NOTE — Discharge Instructions (Signed)
 Flexible Bronchoscopy, Care After This sheet gives you information about how to care for yourself after your test. Your doctor may also give you more specific instructions. If you have problems or questions, contact your doctor. Follow these instructions at home: Eating and drinking When you are wide awake, your numbness is gone and your cough and gag reflexes have come back, you may: Start eating only soft foods. Slowly drink liquids. Six hours after the test, go back to your normal diet. Driving Do not drive for 24 hours if you were given a medicine to help you relax (sedative). Do not drive or use heavy machinery while taking prescription pain medicine. General instructions Take over-the-counter and prescription medicines only as told by your doctor. Return to your normal activities as told. Ask what activities are safe for you. Do not use any products that have nicotine or tobacco in them. This includes cigarettes and e-cigarettes. If you need help quitting, ask your doctor. Keep all follow-up visits as told by your doctor. This is important. It is very important if you had a tissue sample (biopsy) taken. Get help right away if: You have shortness of breath that gets worse. You get light-headed. You feel like you are going to pass out (faint). You have chest pain. You cough up: More than a little blood. More blood than before. Summary Do not use cigarettes. Do not use e-cigarettes. Seek care in the Emergency Department right away if you have chest pain or shortness of breath. Call or MyChart Message our office for any questions or problems at [phone].  Okay to restart Xarelto  1 03/03/2024   This information is not intended to replace advice given to you by your health care provider. Make sure you discuss any questions you have with your health care provider.

## 2024-03-02 NOTE — Progress Notes (Incomplete)
 Pt Sp02 85%-92% on room air. Cont using incentive spirometer. Dr Peggye notified and reviewed pt. She is happy for discharge when Sp02 88-92%. Dr Shelah reviewed x-ray and okay for discharge.

## 2024-03-02 NOTE — Progress Notes (Signed)
 Pt Sp02 89% on room air post-procedure. Encouraged DBE. 02 2L placed on and increased to 92%. Dr Keneth notified and for Duoneb. Same given. Dr Keneth reviewed pt in department and BP low 96/41. Rechecked and 80/40. Pt asymptomatic. Order for IV Albumin  . Same in progress. Cont to monitor.

## 2024-03-02 NOTE — Transfer of Care (Signed)
 Immediate Anesthesia Transfer of Care Note  Patient: Adam Gilmore  Procedure(s) Performed: VIDEO BRONCHOSCOPY WITH ENDOBRONCHIAL NAVIGATION (Left)  Patient Location: PACU  Anesthesia Type:General  Level of Consciousness: awake, alert , and oriented  Airway & Oxygen Therapy: Patient Spontanous Breathing and Patient connected to nasal cannula oxygen  Post-op Assessment: Report given to RN and Post -op Vital signs reviewed and stable  Post vital signs: Reviewed and stable  Last Vitals:  Vitals Value Taken Time  BP 118/71 03/02/24 12:46  Temp    Pulse 78 03/02/24 12:46  Resp 21 03/02/24 12:46  SpO2 96 % 03/02/24 12:46  Vitals shown include unfiled device data.  Last Pain:  Vitals:   03/02/24 0908  TempSrc:   PainSc: 0-No pain      Patients Stated Pain Goal: 0 (03/02/24 0908)  Complications: No notable events documented.

## 2024-03-02 NOTE — Telephone Encounter (Signed)
 Order has been updated.

## 2024-03-02 NOTE — Telephone Encounter (Signed)
 Do you want this done on room air?

## 2024-03-02 NOTE — Op Note (Signed)
 Video Bronchoscopy with Robotic Assisted Bronchoscopic Navigation   Date of Operation: 03/02/2024   Pre-op Diagnosis: Bilateral pulmonary nodules  Post-op Diagnosis: Same  Surgeon: Adam Chris  Assistants: None  Anesthesia: General endotracheal anesthesia  Operation: Flexible video fiberoptic bronchoscopy with robotic assistance and biopsies.  Estimated Blood Loss: Minimal  Complications: None  Indications and History: Adam Gilmore is a 73 y.o. male with history of active tobacco use.  Bilateral pulmonary nodules with hypermetabolism noted on his chest imaging.  He has had a left lower lobe nodule biopsy before that was not diagnostic for malignancy.  Recommendation made to achieve a tissue diagnosis via repeat robotic assisted navigational bronchoscopy.  The risks, benefits, complications, treatment options and expected outcomes were discussed with the patient.  The possibilities of pneumothorax, pneumonia, reaction to medication, pulmonary aspiration, perforation of a viscus, bleeding, failure to diagnose a condition and creating a complication requiring transfusion or operation were discussed with the patient who freely signed the consent.    Description of Procedure: The patient was seen in the Preoperative Area, was examined and was deemed appropriate to proceed.  The patient was taken to [facility] Endoscopy room 3, identified as Adam Gilmore and the procedure verified as Flexible Video Fiberoptic Bronchoscopy.  A Time Out was held and the above information confirmed.   Prior to the date of the procedure a high-resolution CT scan of the chest was performed. Utilizing ION software program a virtual tracheobronchial tree was generated to allow the creation of distinct navigation pathways to the patient's parenchymal abnormalities. After being taken to the operating room general anesthesia was initiated and the patient  was orally intubated. The video fiberoptic bronchoscope was  introduced via the endotracheal tube and a general inspection was performed which showed normal right and left lung anatomy. Aspiration of the bilateral mainstems was completed to remove any remaining secretions. Robotic catheter inserted into patient's endotracheal tube.   Target #1 left lower lobe nodule: The distinct navigation pathways prepared prior to this procedure were then utilized to navigate to patient's lesion identified on CT scan. The robotic catheter was secured into place and the vision probe was withdrawn.  Lesion location was approximated using fluoroscopy.  Local registration and targeting was performed using [employer] Cios mobile C-arm three-dimensional imaging. Under fluoroscopic guidance transbronchial needle biopsies and transbronchial forceps biopsies were performed to be sent for cytology and pathology.  Needle-in-lesion was confirmed using Cios mobile C-arm.    Target #2 left upper lobe nodule: The distinct navigation pathways prepared prior to this procedure were then utilized to navigate to patient's lesion identified on CT scan. The robotic catheter was secured into place and the vision probe was withdrawn.  Lesion location was approximated using fluoroscopy.  Local registration and targeting was performed using [employer] Cios mobile C-arm three-dimensional imaging. Under fluoroscopic guidance transbronchial needle biopsies and transbronchial forceps biopsies were performed to be sent for cytology and pathology.  A single needle biopsy was sent for microbiology.  Needle-in-lesion was confirmed using Cios mobile C-arm.  Under fluoroscopic guidance a single fiducial marker was placed adjacent to the nodule.  Target #3 right lower lobe nodule: The distinct navigation pathways prepared prior to this procedure were then utilized to navigate to patient's lesion identified on CT scan. The robotic catheter was secured into place and the vision probe was withdrawn.   Lesion location was approximated using fluoroscopy.  Local registration and targeting was performed using [employer] Cios mobile C-arm three-dimensional imaging. Under  fluoroscopic guidance transbronchial needle biopsies and transbronchial forceps biopsies were performed to be sent for cytology and pathology.  Needle-in-lesion was confirmed using Cios mobile C-arm.   Under fluoroscopic guidance a single fiducial marker was placed adjacent to the nodule.  Target #4 right upper lobe nodule: The distinct navigation pathways prepared prior to this procedure were then utilized to navigate to patient's lesion identified on CT scan. The robotic catheter was secured into place and the vision probe was withdrawn.  Lesion location was approximated using fluoroscopy.  Local registration and targeting was performed using [employer] Cios mobile C-arm three-dimensional imaging. Under fluoroscopic guidance transbronchial needle biopsies and transbronchial forceps biopsies were performed to be sent for cytology and pathology.  Needle-in-lesion was confirmed using Cios mobile C-arm.  A bronchioalveolar lavage was performed in the right upper lobe and sent for microbiology.  Under fluoroscopic guidance a single fiducial marker was placed adjacent to the nodule.  At the end of the procedure a general airway inspection was performed and there was no evidence of active bleeding. The bronchoscope was removed.  The patient tolerated the procedure well. There was no significant blood loss and there were no obvious complications. A post-procedural chest x-ray is pending.  Samples Target #1: 1. Transbronchial Wang needle biopsies from left lower lobe nodule 2. Transbronchial forceps biopsies from left lower lobe nodule  Samples Target #2: 1. Transbronchial Wang needle biopsies from left upper lobe nodule 2. Transbronchial forceps biopsies from left upper lobe nodule   Samples Target #3: 1. Transbronchial  Wang needle biopsies from right lower lobe nodule 2. Transbronchial forceps biopsies from right lower lobe nodule  Samples Target #4: 1. Transbronchial Wang needle biopsies from right upper lobe nodule 2. Transbronchial forceps biopsies from right upper lobe nodule 3. Bronchoalveolar lavage from right upper lobe  Plans:  The patient will be discharged from the PACU to home when recovered from anesthesia and after chest x-ray is reviewed. We will review the cytology, pathology and microbiology results with the patient when they become available. Outpatient followup will be with CANDIE Lites, NP, Dr Neda and Dr Shelah.    Adam Shelah, MD, PhD 03/02/2024, 12:46 PM [facility] [phone] or if no answer before 7:00PM call [phone] For any issues after 7:00PM please call eLink [phone]

## 2024-03-02 NOTE — Telephone Encounter (Signed)
 Per Dolanda with [employer]; I need the order for the ono to clarify if it's to be on Room air or on o2   Please advise

## 2024-03-02 NOTE — Interval H&P Note (Signed)
 History and Physical Interval Note:  03/02/2024 10:46 AM  Adam Gilmore  has presented today for surgery, with the diagnosis of lung nodules.  The various methods of treatment have been discussed with the patient and family. After consideration of risks, benefits and other options for treatment, the patient has consented to  Procedures: VIDEO BRONCHOSCOPY WITH ENDOBRONCHIAL NAVIGATION (Left) as a surgical intervention.  The patient's history has been reviewed, patient examined, no change in status, stable for surgery.  I have reviewed the patient's chart and labs.  Questions were answered to the patient's satisfaction.     Adam Gilmore

## 2024-03-03 LAB — ACID FAST SMEAR (AFB, MYCOBACTERIA)
Acid Fast Smear: NEGATIVE
Acid Fast Smear: NEGATIVE

## 2024-03-03 NOTE — Anesthesia Postprocedure Evaluation (Signed)
 Anesthesia Post Note  Patient: JAXIN FULFER  Procedure(s) Performed: VIDEO BRONCHOSCOPY WITH ENDOBRONCHIAL NAVIGATION (Left) BRONCHOSCOPY, WITH BIOPSY BRONCHOSCOPY, WITH NEEDLE ASPIRATION BIOPSY IRRIGATION, BRONCHUS INSERTION, FIDUCIAL MARKERS     Patient location during evaluation: PACU Anesthesia Type: General Level of consciousness: awake and alert Pain management: pain level controlled Vital Signs Assessment: post-procedure vital signs reviewed and stable Respiratory status: spontaneous breathing, nonlabored ventilation, respiratory function stable and patient connected to nasal cannula oxygen Cardiovascular status: blood pressure returned to baseline and stable Postop Assessment: no apparent nausea or vomiting Anesthetic complications: no   No notable events documented.  Last Vitals:  Vitals:   03/02/24 1442 03/02/24 1443  BP: (!) 110/92   Pulse: 95 81  Resp: (!) 23 (!) 24  Temp:    SpO2: (!) 89% (!) 89%    Last Pain:  Vitals:   03/02/24 1440  TempSrc:   PainSc: 0-No pain                 Lynwood MARLA Cornea

## 2024-03-04 ENCOUNTER — Encounter (HOSPITAL_COMMUNITY): Payer: Self-pay | Admitting: Emergency Medicine

## 2024-03-04 LAB — CULTURE, BAL-QUANTITATIVE W GRAM STAIN
Culture: NO GROWTH
Gram Stain: NONE SEEN

## 2024-03-04 LAB — CYTOLOGY - NON PAP

## 2024-03-08 ENCOUNTER — Ambulatory Visit: Admitting: Acute Care

## 2024-03-08 ENCOUNTER — Encounter: Payer: Self-pay | Admitting: Acute Care

## 2024-03-08 VITALS — BP 124/76 | HR 82 | Temp 97.8°F | Resp 94 | Ht 72.0 in | Wt 153.4 lb

## 2024-03-08 DIAGNOSIS — R9389 Abnormal findings on diagnostic imaging of other specified body structures: Secondary | ICD-10-CM

## 2024-03-08 DIAGNOSIS — F17210 Nicotine dependence, cigarettes, uncomplicated: Secondary | ICD-10-CM

## 2024-03-08 DIAGNOSIS — R042 Hemoptysis: Secondary | ICD-10-CM | POA: Diagnosis not present

## 2024-03-08 DIAGNOSIS — R942 Abnormal results of pulmonary function studies: Secondary | ICD-10-CM

## 2024-03-08 DIAGNOSIS — Z9889 Other specified postprocedural states: Secondary | ICD-10-CM

## 2024-03-08 DIAGNOSIS — J449 Chronic obstructive pulmonary disease, unspecified: Secondary | ICD-10-CM

## 2024-03-08 DIAGNOSIS — R918 Other nonspecific abnormal finding of lung field: Secondary | ICD-10-CM

## 2024-03-08 DIAGNOSIS — J95830 Postprocedural hemorrhage and hematoma of a respiratory system organ or structure following a respiratory system procedure: Secondary | ICD-10-CM

## 2024-03-08 DIAGNOSIS — Z720 Tobacco use: Secondary | ICD-10-CM

## 2024-03-08 DIAGNOSIS — R0609 Other forms of dyspnea: Secondary | ICD-10-CM

## 2024-03-08 DIAGNOSIS — J9611 Chronic respiratory failure with hypoxia: Secondary | ICD-10-CM | POA: Diagnosis not present

## 2024-03-08 DIAGNOSIS — R911 Solitary pulmonary nodule: Secondary | ICD-10-CM

## 2024-03-08 DIAGNOSIS — J9621 Acute and chronic respiratory failure with hypoxia: Secondary | ICD-10-CM

## 2024-03-08 NOTE — Progress Notes (Signed)
 "  History of Present Illness Adam Gilmore is a 73 y.o. male current every day smoker followed through the lung cancer screening program, referred for consult for abnormal lung cancer screening. He will be followed by Dr. Shelah.   Synopsis 73 year old male current every day smoker ( 56 pack year smoking history) with a left lower lobe lung nodule , with PET scan in July 2025 showing abnormal uptake. Three additional small nodules exhibited low-level uptake, with the most intense being a 5 mm nodule in the left upper lobe. He has experienced significant weight loss, approximately 30 pounds over the last few years, with 10 pounds lost in the past six months.  Recommendation made to achieve a tissue diagnosis via robotic assisted navigational bronchoscopy.  He went for bronchoscopy with biopsies on 10/28/2023. Biopsy of the left lower lobe was negative for malignancy. Plan was for a 3 month follow up scan. The 3 month follow up showed multiple new spiculated nodules that have developed in the interim since the prior study. Dr. Shelah reviewed the scan and recommended repeat bronchoscopy with biopsies which was  subsequently  scheduled for 03/02/2024. PET scan was ordered to help with navigation. Pt is here today for follow-up after bronchoscopy with biopsies on 03/02/2024, to ensure he is doing well postprocedure and to review biopsy results.       03/08/2024 Discussed the use of AI scribe software for clinical note transcription with the patient, who gave verbal consent to proceed.  History of Present Illness Adam Gilmore is a 73 year old male who presents for follow-up after a bronchoscopy and biopsy .  He states he did well overall postprocedure.  He did have a little bit of a sore throat with cough and hoarse voice that has subsequently cleared.  He underwent a bronchoscopy with biopsies 03/02/2024, which showed no malignant cells. The biopsies from the left lower lobe and right upper lobe  showed rare atypical cells favoring a reactive process, possibly inflammatory or infectious in nature. The right lower lobe showed no malignant cells.  He is still experiencing some hemoptysis postprocedure, which is decreasing over time. He reports that he expects the hemoptysis to stop as it did previously.  Secretions continue to get darker with smaller and smaller amounts of hemoptysis.  We discussed that he needs to give us  a call if this does not clear within the next few days so we can further evaluate.  He has not received a call regarding the overnight oximetry testing or any information about qualifying for oxygen, despite having the necessary qualifications for insurance approval. He has not been informed of any additional requests from the insurance company.  We will follow-up on this within the office to see where the hold-up is.  Plan will be for 35-month follow-up CT chest to ensure continued stability of the nodules of concern.  Patient understands to call to be seen sooner if he develops any hemoptysis or unexplained weight loss.  He acknowledges he needs to work on quitting smoking.     Test Results: Cytology 03/02/2024 A. LUNG, LLL, FINE NEEDLE ASPIRATION:  Rare atypical cells present, favor reactive  See comment   B. LUNG, LUL TARGET #2, FINE NEEDLE ASPIRATION:  No malignant cells identified  Reactive bronchial cells  See comment   COMMENT:  Immunohistochemistry shows the cells are positive with TTF-1 and negative with NKX3.1, CD56, synaptophysin and chromogranin.   D. LUNG, RLL TARGET #3, FINE NEEDLE ASPIRATION:  No  malignant cells identified   E. LUNG, RUL TARGET #4, FINE NEEDLE ASPIRATION:  Rare atypical cells, favor reactive    PET scan 02/24/2024 Overall pattern is an increase in the size of metabolic pulmonary nodules with several new pulmonary nodules. 2. Unusual presentation of metastatic disease. Potential waxing and waning inflammatory nodules. 3.  Overall, the pattern is worsening, therefore repeat biopsy may be warranted. 4. No evidence of metastatic adenopathy or distant metastatic disease.  CT chest 02/02/2024 Multiple bilateral spiculated pulmonary nodules as above. There has been slight decrease in the subpleural left lower lobe nodule seen previously, with stable appearance of the prior left upper lobe pulmonary nodule. However, multiple new bilateral spiculated pulmonary nodules have developed with index lesions provided above. Findings are once again highly concerning for malignancy. Further evaluation with repeat PET scan or repeat bronchoscopy and tissue sampling is recommended. 2. Pulmonary emphysema is present. Pulmonary emphysema is an independent risk factor for lung cancer. Recommend patient be considered for lung cancer screening.      Latest Ref Rng & Units 03/02/2024    9:20 AM 10/28/2023   11:40 AM 10/15/2022    3:48 PM  CBC  WBC 4.0 - 10.5 K/uL 6.2  5.9  5.7   Hemoglobin 13.0 - 17.0 g/dL 85.2  85.5  84.6   Hematocrit 39.0 - 52.0 % 44.5  43.5  43.9   Platelets 150 - 400 K/uL 155  190  214        Latest Ref Rng & Units 03/02/2024    9:20 AM 10/28/2023   11:40 AM 10/15/2022    3:48 PM  BMP  Glucose 70 - 99 mg/dL 888  895  82   BUN 8 - 23 mg/dL 14  10  10    Creatinine 0.61 - 1.24 mg/dL 9.10  9.09  9.05   BUN/Creat Ratio 10 - 24   11   Sodium 135 - 145 mmol/L 139  140  147   Potassium 3.5 - 5.1 mmol/L 3.9  4.3  5.3   Chloride 98 - 111 mmol/L 102  103  103   CO2 22 - 32 mmol/L 29  28  29    Calcium  8.9 - 10.3 mg/dL 8.5  9.1  9.5     BNP    Component Value Date/Time   BNP 31.3 04/02/2021 1626    ProBNP No results found for: PROBNP  PFT    Component Value Date/Time   FEV1PRE 1.13 03/26/2022 1452   FEV1POST 1.24 03/26/2022 1452   FVCPRE 2.88 03/26/2022 1452   FVCPOST 3.06 03/26/2022 1452   TLC 11.07 03/26/2022 1452   DLCOUNC 9.21 03/26/2022 1452   PREFEV1FVCRT 39 03/26/2022 1452    PSTFEV1FVCRT 41 03/26/2022 1452    DG Chest Port 1 View Result Date: 03/02/2024 CLINICAL DATA:  Post bronchoscopy with biopsy. EXAM: PORTABLE CHEST 1 VIEW COMPARISON:  10/28/2023 and CT 02/02/2024 FINDINGS: Lungs are adequately inflated demonstrate evidence of emphysematous disease. Stable patchy bilateral densities and interstitial prominence. Small metallic density over the medial left upper lobe as well as over the medial right suprahilar and infrahilar region likely due to patient's recent bronchoscopy with biopsies of known multiple spiculated nodules. No pneumothorax. No effusion. Cardiomediastinal silhouette and remainder of the exam is unchanged. IMPRESSION: 1. No pneumothorax post bronchoscopy with biopsies of known multiple spiculated nodules. 2. Stable patchy bilateral densities and interstitial prominence. 3. Emphysematous disease. Electronically Signed   By: Toribio Agreste M.D.   On: 03/02/2024 13:40   DG  C-Arm 1-60 Min-No Report Result Date: 03/02/2024 Fluoroscopy was utilized by the requesting physician.  No radiographic interpretation.   DG C-Arm 1-60 Min-No Report Result Date: 03/02/2024 Fluoroscopy was utilized by the requesting physician.  No radiographic interpretation.   DG C-Arm 1-60 Min-No Report Result Date: 03/02/2024 Fluoroscopy was utilized by the requesting physician.  No radiographic interpretation.   DG C-ARM BRONCHOSCOPY Result Date: 03/02/2024 C-ARM BRONCHOSCOPY: Fluoroscopy was utilized by the requesting physician.  No radiographic interpretation.   NM PET Image Restage (PS) Skull Base to Thigh (F-18 FDG) Result Date: 02/26/2024 EXAM: PET AND CT SKULL BASE TO MID THIGH 02/24/2024 05:58:44 PM TECHNIQUE: RADIOPHARMACEUTICAL: 7.44 mCi F-18 FDG IV LAC/CD Uptake time 60 minutes. Glucose level 109 mg/dL. PET imaging was acquired from the base of the skull to the mid thighs. Non-contrast enhanced computed tomography was obtained for attenuation correction and  anatomic localization. COMPARISON: PREV. PET 10/09/2023. CLINICAL HISTORY: RESTAGING: Lung nodules, multiple. FINDINGS: HEAD AND NECK: No metabolically active cervical lymphadenopathy. CHEST: The previously biopsied nodule in the left lower lobe measures 12 mm compared to 14 mm with SUVmax of 2.7 compared to SUVmax of 5.3 (Image 78). There are several additional hypermetabolic nodules which have increased in the interval. For example, a new nodule in the medial aspect of the left upper lobe on image 58 measures 13 mm with SUVmax equal to 3.1. Nodule in the medial aspect of the right upper lobe measures 5 mm unchanged from 5 mm with SUVmax equal to 2.5 increased from 1.3. Ill defined lesion in the right upper lobe measuring 12 mm on the 7/9 measuring 3.0 cm, new from prior. Nodule within the medial aspect of the right lower lobe measuring 9 mm on image 75 with SUVmax equal 1.9 is new for comparison exam. Similar new nodule on image 84 in the right lower lobe. No hypermetabolic lymph nodes. ABDOMEN AND PELVIS: No metabolically active lymphadenopathy. Physiologic activity within the gastrointestinal and genitourinary systems. BONES AND SOFT TISSUE: No metabolically active aggressive osseous lesion. Overall pattern is an increase in the size of metabolic pulmonary nodules with several new pulmonary nodules. Unusual presentation of metastatic disease. Potential waxing and waning inflammatory nodules. Overall, the pattern is worsening, therefore repeat biopsy may be warranted. No evidence of metastatic adenopathy. IMPRESSION: 1. Overall pattern is an increase in the size of metabolic pulmonary nodules with several new pulmonary nodules. 2. Unusual presentation of metastatic disease. Potential waxing and waning inflammatory nodules. 3. Overall, the pattern is worsening, therefore repeat biopsy may be warranted. 4. No evidence of metastatic adenopathy or distant metastatic disease. Electronically signed by: Norleen Boxer MD  02/26/2024 12:39 PM EST RP Workstation: [id number]     Past medical hx Past Medical History:  Diagnosis Date   Adenomatous colon polyp    Atrial fibrillation (HCC)    Centrilobular emphysema (HCC)    COPD (chronic obstructive pulmonary disease) (HCC)    Coronary artery disease    Dyspnea    History of DVT of lower extremity    Hypercholesterolemia    Hypercholesterolemia    prostate ca dx'd 03/2012   surg only   Skin cancer    Tobacco dependence      Social History[1]  Mr.Prigmore reports that he has been smoking cigarettes. He has a 56 pack-year smoking history. He has never used smokeless tobacco. He reports current alcohol use. He reports that he does not use drugs.  Tobacco Cessation: Ready to quit: Not Answered Counseling given: Not Answered Tobacco comments: 3/4  packs a day 03/08/2024 Current everyday smoker with a 56-pack-year smoking history.  Patient has been counseled extensively on smoking cessation.  Past surgical hx, Family hx, Social hx all reviewed.  Current Outpatient Medications on File Prior to Visit  Medication Sig   acetaminophen  (TYLENOL ) 325 MG tablet Take 325 mg by mouth every 6 (six) hours as needed (for pain.).   albuterol  (VENTOLIN  HFA) 108 (90 Base) MCG/ACT inhaler TAKE 2 PUFFS BY MOUTH EVERY 6 HOURS AS NEEDED FOR WHEEZE OR SHORTNESS OF BREATH   atorvastatin  (LIPITOR) 40 MG tablet TAKE 1 TABLET BY MOUTH EVERY DAY   Fluticasone -Umeclidin-Vilant (TRELEGY ELLIPTA ) 200-62.5-25 MCG/ACT AEPB Inhale 1 puff into the lungs daily. **discontinue previous rx**   isosorbide  mononitrate (IMDUR ) 30 MG 24 hr tablet TAKE 1 TABLET BY MOUTH AT BEDTIME.   latanoprost  (XALATAN ) 0.005 % ophthalmic solution Place 1 drop into both eyes at bedtime.   niacin  500 MG tablet Take 500 mg by mouth at bedtime.   nitroGLYCERIN  (NITROSTAT ) 0.4 MG SL tablet Place 1 tablet (0.4 mg total) under the tongue every 5 (five) minutes as needed for chest pain.   rivaroxaban  (XARELTO ) 20 MG  TABS tablet Take 20 mg by mouth daily with supper.   vitamin C (ASCORBIC ACID ) 500 MG tablet Take 500 mg by mouth daily.   vitamin E  180 MG (400 UNITS) capsule Take 400 Units by mouth daily.   No current facility-administered medications on file prior to visit.     Allergies[2]  Review Of Systems:  Constitutional:   No  weight loss, night sweats,  Fevers, chills, fatigue, or  lassitude.  HEENT:   No headaches,  Difficulty swallowing,  Tooth/dental problems, or  Sore throat,                No sneezing, itching, ear ache, nasal congestion, post nasal drip,   CV:  No chest pain,  Orthopnea, PND, swelling in lower extremities, anasarca, dizziness, palpitations, syncope.   GI  No heartburn, indigestion, abdominal pain, nausea, vomiting, diarrhea, change in bowel habits, loss of appetite, bloody stools.   Resp: No shortness of breath with exertion or at rest.  No excess mucus, no productive cough,  No non-productive cough,  No coughing up of blood.  No change in color of mucus.  No wheezing.  No chest wall deformity  Skin: no rash or lesions.  GU: no dysuria, change in color of urine, no urgency or frequency.  No flank pain, no hematuria   MS:  No joint pain or swelling.  No decreased range of motion.  No back pain.  Psych:  No change in mood or affect. No depression or anxiety.  No memory loss.   Vital Signs BP 124/76   Pulse 82   Temp 97.8 F (36.6 C) (Oral)   Resp (!) 94   Ht 6' (1.829 m)   Wt 153 lb 6.4 oz (69.6 kg)   BMI 20.80 kg/m    Physical Exam: Physical Exam GENERAL: No distress, alert and oriented times 3. EARS NOSE THROAT: No sinus tenderness, tympanic membranes clear, pale nasal mucosa, no oral exudate, no post nasal drip, no lymphadenopathy. CHEST: No wheeze, rales, dullness, no accessory muscle use, no nasal flaring, no sternal retractions. CARDIAC: S1, S2, regular rate and rhythm, no murmur. ABDOMINAL: Soft, non tender. ND, BS present, EXTREMITIES: No  clubbing, cyanosis, edema. No obvious deformities NEUROLOGICAL: Normal strength. Alert and oriented x 3, MAE x 4 SKIN: No rashes, warm and dry. No obvious  skin lesions PSYCHIATRIC: Normal mood and behavior.   Assessment/Plan Assessment & Plan Pulmonary nodules Current everyday smoker Second bronchoscopy with biopsies for growing nodules Biopsies negative for malignancy.  Rare atypical cells suggest reactive changes, likely due to infection or inflammation.  Non-malignant process confirmed. - Scheduled follow-up scan in three months to monitor pulmonary nodules for stability  Hemoptysis post-bronchoscopy Hemoptysis decreasing, darker blood indicates old blood, which is reassuring - Instructed to call if hemoptysis persists next week for further evaluation.  Dyspnea on exertion Chronic hypoxic respiratory failure Plan - Pending overnight oximetry to assess for nocturnal desaturations - Oxygen has been ordered for use with exertion - Patient is to wear oxygen at 2 L nasal cannula while ambulating  Current everyday smoker -Smoking cessation again encouraged. -Patient has been continuously counseled on smoking cessation.  He has resources available if he determines he would like to quit   I spent 25 minutes dedicated to the care of this patient on the date of this encounter to include pre-visit review of records, face-to-face time with the patient discussing conditions above, post visit ordering of testing, clinical documentation with the electronic health record, making appropriate referrals as documented, and communicating necessary information to the patient's healthcare team.   Lauraine JULIANNA Lites, NP 03/08/2024  9:24 AM             [1]  Social History Tobacco Use   Smoking status: Every Day    Current packs/day: 1.00    Average packs/day: 1 pack/day for 56.0 years (56.0 ttl pk-yrs)    Types: Cigarettes   Smokeless tobacco: Never   Tobacco comments:    3/4 packs a day  03/08/2024  Vaping Use   Vaping status: Never Used  Substance Use Topics   Alcohol use: Yes    Comment: socially   Drug use: No  [2]  Allergies Allergen Reactions   Rosuvastatin     Other Reaction(s): sluggish   "

## 2024-03-08 NOTE — Patient Instructions (Signed)
 It is good to see you today. I am glad you have done well after your bronchoscopy with biopsies. Your biopsies were negative for malignancy. This is great news. We will do a 3 month follow up Ct chest . This will be due 05/2024. You will get a call to get this scheduled. You will follow up with me 1-2 weeks after the scan to review the results. Have a Happy Holiday. Please contact office for sooner follow up if symptoms do not improve or worsen or seek emergency care

## 2024-03-16 NOTE — Telephone Encounter (Signed)
 [facility] can we check on referral from 12/12? Thanks

## 2024-03-29 ENCOUNTER — Other Ambulatory Visit: Payer: Self-pay | Admitting: Internal Medicine

## 2024-03-30 MED ORDER — ISOSORBIDE MONONITRATE ER 30 MG PO TB24: 30.0000 mg | ORAL_TABLET | Freq: Every day | ORAL | 2 refills | Status: AC

## 2024-03-31 LAB — FUNGUS CULTURE RESULT

## 2024-03-31 LAB — FUNGAL ORGANISM REFLEX

## 2024-03-31 LAB — FUNGUS CULTURE WITH STAIN

## 2024-04-01 LAB — FUNGAL ORGANISM REFLEX

## 2024-04-01 LAB — FUNGUS CULTURE RESULT

## 2024-04-01 LAB — FUNGUS CULTURE WITH STAIN

## 2024-04-07 ENCOUNTER — Other Ambulatory Visit: Payer: Self-pay | Admitting: Acute Care

## 2024-04-07 DIAGNOSIS — J449 Chronic obstructive pulmonary disease, unspecified: Secondary | ICD-10-CM

## 2024-04-10 ENCOUNTER — Other Ambulatory Visit: Payer: Self-pay | Admitting: Pulmonary Disease

## 2024-04-15 LAB — ACID FAST CULTURE WITH REFLEXED SENSITIVITIES (MYCOBACTERIA)
Acid Fast Culture: NEGATIVE
Acid Fast Culture: NEGATIVE

## 2024-04-16 ENCOUNTER — Telehealth: Payer: Self-pay | Admitting: Acute Care

## 2024-04-16 NOTE — Telephone Encounter (Signed)
 I called patient to follow up on message below and he said he tried to call and schedule his CT but was told that he couldn't and that the office would have to call and schedule it. Please help patient schedule.  Copied from CRM #[id number]. Topic: Appointments - Appointment Scheduling >> Apr 16, 2024 12:09 PM Leila C wrote: Patient/patient representative is calling to schedule an appointment. Refer to attachments for appointment information.  Patient [phone] states thought he scheduled his CT scan, but he did not and needs to reschedule 06/22/24 at 1:30 pm with NP, Groce. Provided [facility] phone number [phone]. Patient will contact [facility] to schedule CT scan. Patient is unsure if he'll need to reschedule with NP, Groce. Unable to reach CAL, please call back.

## 2024-06-09 ENCOUNTER — Other Ambulatory Visit

## 2024-06-22 ENCOUNTER — Ambulatory Visit: Admitting: Acute Care
